# Patient Record
Sex: Male | Born: 1940
Health system: Southern US, Community
[De-identification: ages and names within clinical notes are randomized; demographics above are authoritative.]

## PROBLEM LIST (undated history)

## (undated) DIAGNOSIS — E785 Hyperlipidemia, unspecified: Secondary | ICD-10-CM

## (undated) DIAGNOSIS — M19079 Primary osteoarthritis, unspecified ankle and foot: Secondary | ICD-10-CM

## (undated) DIAGNOSIS — R972 Elevated prostate specific antigen [PSA]: Secondary | ICD-10-CM

## (undated) DIAGNOSIS — M1990 Unspecified osteoarthritis, unspecified site: Secondary | ICD-10-CM

## (undated) DIAGNOSIS — Z87442 Personal history of urinary calculi: Secondary | ICD-10-CM

## (undated) DIAGNOSIS — C801 Malignant (primary) neoplasm, unspecified: Secondary | ICD-10-CM

## (undated) DIAGNOSIS — K4090 Unilateral inguinal hernia, without obstruction or gangrene, not specified as recurrent: Secondary | ICD-10-CM

## (undated) DIAGNOSIS — Z9989 Dependence on other enabling machines and devices: Secondary | ICD-10-CM

## (undated) HISTORY — DX: Hyperlipidemia, unspecified: E78.5

## (undated) HISTORY — PX: KNEE ARTHROSCOPY: SUR90

## (undated) HISTORY — PX: ANKLE FUSION: SHX881

## (undated) HISTORY — PX: FOOT ARTHRODESIS, SUBTALAR: SUR53

## (undated) HISTORY — DX: Personal history of urinary calculi: Z87.442

## (undated) HISTORY — PX: JOINT REPLACEMENT: SHX530

## (undated) HISTORY — PX: HYDROCELE EXCISION / REPAIR: SUR1145

## (undated) HISTORY — PX: BONE MARROW ASPIRATION: SHX1252

## (undated) HISTORY — DX: Hereditary hemochromatosis: E83.110

## (undated) HISTORY — DX: Primary osteoarthritis, unspecified ankle and foot: M19.079

---

## 2004-09-11 HISTORY — PX: REVISION TOTAL HIP ARTHROPLASTY: SHX766

## 2008-04-30 DIAGNOSIS — C4491 Basal cell carcinoma of skin, unspecified: Secondary | ICD-10-CM

## 2008-04-30 HISTORY — DX: Basal cell carcinoma of skin, unspecified: C44.91

## 2010-04-18 ENCOUNTER — Ambulatory Visit: Payer: Self-pay | Admitting: Orthopedic Surgery

## 2010-05-17 DIAGNOSIS — C4492 Squamous cell carcinoma of skin, unspecified: Secondary | ICD-10-CM

## 2010-05-17 HISTORY — DX: Squamous cell carcinoma of skin, unspecified: C44.92

## 2012-10-03 DIAGNOSIS — M19079 Primary osteoarthritis, unspecified ankle and foot: Secondary | ICD-10-CM

## 2012-10-03 HISTORY — DX: Primary osteoarthritis, unspecified ankle and foot: M19.079

## 2014-02-24 DIAGNOSIS — M19079 Primary osteoarthritis, unspecified ankle and foot: Secondary | ICD-10-CM

## 2014-02-24 HISTORY — DX: Primary osteoarthritis, unspecified ankle and foot: M19.079

## 2015-11-03 DIAGNOSIS — M19071 Primary osteoarthritis, right ankle and foot: Secondary | ICD-10-CM | POA: Diagnosis not present

## 2015-11-03 DIAGNOSIS — E785 Hyperlipidemia, unspecified: Secondary | ICD-10-CM | POA: Insufficient documentation

## 2015-11-03 DIAGNOSIS — E784 Other hyperlipidemia: Secondary | ICD-10-CM | POA: Diagnosis not present

## 2015-11-03 DIAGNOSIS — Z125 Encounter for screening for malignant neoplasm of prostate: Secondary | ICD-10-CM | POA: Diagnosis not present

## 2015-11-03 DIAGNOSIS — Z1211 Encounter for screening for malignant neoplasm of colon: Secondary | ICD-10-CM | POA: Diagnosis not present

## 2015-11-03 DIAGNOSIS — Z85828 Personal history of other malignant neoplasm of skin: Secondary | ICD-10-CM | POA: Diagnosis not present

## 2015-11-03 HISTORY — DX: Hyperlipidemia, unspecified: E78.5

## 2015-11-03 HISTORY — DX: Hereditary hemochromatosis: E83.110

## 2015-12-01 ENCOUNTER — Ambulatory Visit (INDEPENDENT_AMBULATORY_CARE_PROVIDER_SITE_OTHER): Payer: Commercial Managed Care - HMO | Admitting: Urology

## 2015-12-01 ENCOUNTER — Encounter: Payer: Self-pay | Admitting: Urology

## 2015-12-01 VITALS — BP 139/82 | HR 91 | Ht 67.0 in | Wt 168.0 lb

## 2015-12-01 DIAGNOSIS — R972 Elevated prostate specific antigen [PSA]: Secondary | ICD-10-CM

## 2015-12-01 LAB — URINALYSIS, COMPLETE
BILIRUBIN UA: NEGATIVE
Glucose, UA: NEGATIVE
Leukocytes, UA: NEGATIVE
NITRITE UA: NEGATIVE
Protein, UA: NEGATIVE
SPEC GRAV UA: 1.025 (ref 1.005–1.030)
UUROB: 0.2 mg/dL (ref 0.2–1.0)
pH, UA: 5.5 (ref 5.0–7.5)

## 2015-12-01 LAB — MICROSCOPIC EXAMINATION

## 2015-12-01 NOTE — Progress Notes (Signed)
12/01/2015 5:27 PM   Andrew Walsh [birth year] [id number]  Referring provider: Adin Hector, MD [facility] [place], [place] [address]  Chief Complaint  Patient presents with  . Elevated PSA    New Patient    HPI: 75 year old male referred by Dr. Caryl Walsh for evaluation of elevated PSA.  His most recent PSA was 5.09 on 11/03/2015. Repeat PSA was drawn today.  He has no prior PSA values for comparison.  He previously saw Dr. Jacelyn Grip in [place] as a PCP and has recently transferred his care to Dr. Caryl Walsh.    He did see a Dealer (Dr. Lucia Walsh ) 5 years ago for a similar reason.  He does not recall what happened during that visit and never had a prostate biopsy.    No issues voiding.  No nocturia, good stream, no dysuria, good bladder emptying.  No UTIs or gross hematuria.    Possible family history of prostate cancer in brother but is unsure of details (astranged from him).  No weight loss or bone pain.  PMHx significant for hemochromatosis.        PMH: Past Medical History  Diagnosis Date  . Ankle arthropathy 02/24/2014  . Arthritis of foot, degenerative 10/03/2012    Overview:  Subtalar arthritis   . Localized, primary osteoarthritis of ankle or foot 02/24/2014  . Hereditary hemochromatosis ([place]) 11/03/2015    Overview:  Treated by regular blood donation   . HLD (hyperlipidemia) 11/03/2015  . History of kidney stones     Surgical History: Past Surgical History  Procedure Laterality Date  . Revision total hip arthroplasty  2006    Right 2011, Left 2006  . Ankle fusion Right 2001, 2016    Home Medications:    Medication List       This list is accurate as of: 12/01/15  5:27 PM.  Always use your most recent med list.               Glucosamine 500 MG Caps  Take by mouth.     MULTI-VITAMINS Tabs  Take by mouth.     simvastatin 20 MG tablet  Commonly known as:  ZOCOR        Allergies: No Known Allergies  Family History: Family  History  Problem Relation Age of Onset  . Bladder Cancer Neg Hx   . Prostate cancer Neg Hx   . Kidney cancer Neg Hx     Social History:  reports that he has quit smoking. He does not have any smokeless tobacco history on file. He reports that he drinks alcohol. He reports that he does not use illicit drugs.  ROS: UROLOGY Frequent Urination?: No Hard to postpone urination?: No Burning/pain with urination?: No Get up at night to urinate?: No Leakage of urine?: No Urine stream starts and stops?: No Trouble starting stream?: No Do you have to strain to urinate?: No Blood in urine?: No Urinary tract infection?: No Sexually transmitted disease?: No Injury to kidneys or bladder?: No Painful intercourse?: No Weak stream?: No Erection problems?: No Penile pain?: No  Gastrointestinal Nausea?: No Vomiting?: No Indigestion/heartburn?: No Diarrhea?: No Constipation?: No  Constitutional Fever: No Night sweats?: No Weight loss?: No Fatigue?: No  Skin Skin rash/lesions?: No Itching?: No  Eyes Blurred vision?: No Double vision?: No  Ears/Nose/Throat Sore throat?: No Sinus problems?: No  Hematologic/Lymphatic Swollen glands?: No Easy bruising?: No  Cardiovascular Leg swelling?: No Chest pain?: No  Respiratory Cough?: No Shortness of  breath?: No  Endocrine Excessive thirst?: No  Musculoskeletal Back pain?: No Joint pain?: No  Neurological Headaches?: No Dizziness?: No  Psychologic Depression?: No Anxiety?: No  Physical Exam: BP 139/82 mmHg  Pulse 91  Ht 5' 7"  (1.702 m)  Wt 168 lb (76.204 kg)  BMI 26.31 kg/m2  Constitutional:  Alert and oriented, No acute distress. HEENT: [place] AT, moist mucus membranes.  Trachea midline, no masses. Cardiovascular: No clubbing, cyanosis, or edema. Respiratory: Normal respiratory effort, no increased work of breathing. GI: Abdomen is soft, nontender, nondistended, no abdominal masses GU: No CVA tenderness.  Rectal:  Normal sphincter tone.  Prostate 30 cc, nontender, no nodules. Skin: No rashes, bruises or suspicious lesions. Lymph: No cervical adenopathy. Neurologic: Grossly intact, no focal deficits, moving all 4 extremities. Psychiatric: Normal mood and affect.  Laboratory Data: Comprehensive Metabolic Panel (CMP) (50/05/3817 11:23 AM) Comprehensive Metabolic Panel (CMP) (29/93/7169 11:23 AM)  Component Value Ref Range  Glucose 95 70 - 110 mg/dL  Sodium 136 136 - 145 mmol/L  Potassium 4.2 3.6 - 5.1 mmol/L  Chloride 102 97 - 109 mmol/L  Carbon Dioxide (CO2) 24.7 22.0 - 32.0 mmol/L  Urea Nitrogen (BUN) 18 7 - 25 mg/dL  Creatinine 0.9 0.7 - 1.3 mg/dL  Glomerular Filtration Rate (eGFR), MDRD Estimate 82 >60 mL/min/1.73sq m    CBC w/auto Differential (5 Part) (11/03/2015 11:23 AM) CBC w/auto Differential (5 Part) (11/03/2015 11:23 AM)  Component Value Ref Range  WBC (White Blood Cell Count) 5.5 4.1 - 10.2 10^3/uL  RBC (Red Blood Cell Count) 3.52 (L) 4.69 - 6.13 10^6/uL  Hemoglobin 12.5 (L) 14.1 - 18.1 gm/dL  Hematocrit 36.4 (L) 40.0 - 52.0 %  MCV (Mean Corpuscular Volume) 103.4 (H) 80.0 - 100.0 fl  MCH (Mean Corpuscular Hemoglobin) 35.5 (H) 27.0 - 31.2 pg  MCHC (Mean Corpuscular Hemoglobin Concentration) 34.3 32.0 - 36.0 gm/dL  Platelet Count 209 150 - 450 10^3/uL     Urinalysis UA reviewed today, negative. Dip positive for 2+ blood but no red blood cells seen on microscopic exam. Trace ketones.  Pertinent Imaging: n/a  Assessment & Plan:    1. Elevated PSA  We reviewed the implications of an elevated PSA and the uncertainty surrounding it. In general, a man's PSA increases with age and is produced by both normal and cancerous prostate tissue. Differential for elevated PSA is BPH, prostate cancer, infection, recent intercourse/ejaculation, prostate infarction, recent urethroscopic manipulation (foley placement/cystoscopy) and prostatitis. Management of an elevated PSA can include  observation or prostate biopsy and wediscussed this in detail. We discussed that indications for prostate biopsy are defined by age and race specific PSA cutoffs as well as a PSA velocity of 0.75/year.  PSA repeated today. We'll call patient with follow-up PSA results.  Patient has signed release from his previous PCP to obtain his laboratory data. He will also check at home to see if he has any of this and will call our office or stop by with those records. I expect that this will be helpful to establish PSA trend.  If PSA) stable today, we'll recommend following his PSA was repeated in 6 months to help establish a trend prior to proceeding with any biopsy. Patient is agreeable with this plan.  - PSA - Urinalysis, Complete   Return in about 6 months (around 06/02/2016) for PSA/ DRE (lab work prior to visit).  Hollice Espy, MD  [facility] [address] [place], [place] [address] [phone]

## 2015-12-02 LAB — PSA: Prostate Specific Ag, Serum: 4.9 ng/mL — ABNORMAL HIGH (ref 0.0–4.0)

## 2015-12-03 ENCOUNTER — Telehealth: Payer: Self-pay

## 2015-12-03 NOTE — Telephone Encounter (Signed)
-----   Message from Hollice Espy, MD sent at 12/02/2015  2:43 PM EDT ----- Please call this patient with his PSA results, 4.9. Please let him know that this indeed is elevated by it it would be helpful to have additional data points i.e. previous PSAs. Please remind him that he was going to try to look through his records and bring some of those values. At this point, a do feel that it is reasonable to wait 6 months and repeat his PSA and rectal exam prior to proceeding with biopsy.  Hollice Espy, MD

## 2015-12-03 NOTE — Telephone Encounter (Signed)
Patient notified, he will keep looking through his records if he does not find he will come to the office to sign a release form.

## 2016-01-31 DIAGNOSIS — D485 Neoplasm of uncertain behavior of skin: Secondary | ICD-10-CM | POA: Diagnosis not present

## 2016-01-31 DIAGNOSIS — L578 Other skin changes due to chronic exposure to nonionizing radiation: Secondary | ICD-10-CM | POA: Diagnosis not present

## 2016-01-31 DIAGNOSIS — D692 Other nonthrombocytopenic purpura: Secondary | ICD-10-CM | POA: Diagnosis not present

## 2016-01-31 DIAGNOSIS — Z85828 Personal history of other malignant neoplasm of skin: Secondary | ICD-10-CM | POA: Diagnosis not present

## 2016-01-31 DIAGNOSIS — Z1283 Encounter for screening for malignant neoplasm of skin: Secondary | ICD-10-CM | POA: Diagnosis not present

## 2016-01-31 DIAGNOSIS — L281 Prurigo nodularis: Secondary | ICD-10-CM | POA: Diagnosis not present

## 2016-01-31 DIAGNOSIS — L821 Other seborrheic keratosis: Secondary | ICD-10-CM | POA: Diagnosis not present

## 2016-01-31 DIAGNOSIS — L570 Actinic keratosis: Secondary | ICD-10-CM | POA: Diagnosis not present

## 2016-01-31 HISTORY — DX: Actinic keratosis: L57.0

## 2016-04-26 DIAGNOSIS — E784 Other hyperlipidemia: Secondary | ICD-10-CM | POA: Diagnosis not present

## 2016-05-03 DIAGNOSIS — M19271 Secondary osteoarthritis, right ankle and foot: Secondary | ICD-10-CM | POA: Diagnosis not present

## 2016-05-03 DIAGNOSIS — M19071 Primary osteoarthritis, right ankle and foot: Secondary | ICD-10-CM | POA: Diagnosis not present

## 2016-05-03 DIAGNOSIS — E784 Other hyperlipidemia: Secondary | ICD-10-CM | POA: Diagnosis not present

## 2016-05-03 DIAGNOSIS — D6489 Other specified anemias: Secondary | ICD-10-CM | POA: Diagnosis not present

## 2016-05-11 DIAGNOSIS — D6489 Other specified anemias: Secondary | ICD-10-CM | POA: Diagnosis not present

## 2016-06-01 DIAGNOSIS — L578 Other skin changes due to chronic exposure to nonionizing radiation: Secondary | ICD-10-CM | POA: Diagnosis not present

## 2016-06-01 DIAGNOSIS — Z85828 Personal history of other malignant neoplasm of skin: Secondary | ICD-10-CM | POA: Diagnosis not present

## 2016-06-01 DIAGNOSIS — L820 Inflamed seborrheic keratosis: Secondary | ICD-10-CM | POA: Diagnosis not present

## 2016-06-01 DIAGNOSIS — L570 Actinic keratosis: Secondary | ICD-10-CM | POA: Diagnosis not present

## 2016-06-02 ENCOUNTER — Other Ambulatory Visit: Payer: Commercial Managed Care - HMO

## 2016-06-02 DIAGNOSIS — R972 Elevated prostate specific antigen [PSA]: Secondary | ICD-10-CM

## 2016-06-03 LAB — PSA: Prostate Specific Ag, Serum: 5.5 ng/mL — ABNORMAL HIGH (ref 0.0–4.0)

## 2016-06-09 ENCOUNTER — Ambulatory Visit (INDEPENDENT_AMBULATORY_CARE_PROVIDER_SITE_OTHER): Payer: Commercial Managed Care - HMO | Admitting: Urology

## 2016-06-09 ENCOUNTER — Encounter: Payer: Self-pay | Admitting: Urology

## 2016-06-09 VITALS — BP 158/92 | HR 79 | Ht 68.0 in | Wt 170.0 lb

## 2016-06-09 DIAGNOSIS — R972 Elevated prostate specific antigen [PSA]: Secondary | ICD-10-CM | POA: Diagnosis not present

## 2016-06-09 NOTE — Progress Notes (Signed)
06/09/2016 1:53 PM   Lynder Parents 1941/04/16 [id number]  Referring provider: Adin Hector, MD [facility] [place], [place] [address]  Chief Complaint  Patient presents with  . Elevated PSA    6 month w/PSA    HPI: 75 year old male referred by Dr. [name] for evaluation of elevated PSA.  His most recent PSA was 5.09 on 11/03/2015. Repeat PSA was  4.9.  PSA today 5.5.  He previously saw Dr. Jacelyn Grip in [place] as a PCP and has recently transferred his care to Dr. [name].    He did see a Dealer (Dr. Lucia Bitter ) 5 years ago for a similar reason.  He does not recall what happened during that visit and never had a prostate biopsy.    No issues voiding.  No nocturia, good stream, no dysuria, good bladder emptying.  No UTIs or gross hematuria.    Possible family history of prostate cancer in brother but is unsure of details (astranged from him).  No weight loss or bone pain.  PMHx significant for hemochromatosis.       PMH: Past Medical History:  Diagnosis Date  . Ankle arthropathy 02/24/2014  . Arthritis of foot, degenerative 10/03/2012   Overview:  Subtalar arthritis   . Hereditary hemochromatosis ([place]) 11/03/2015   Overview:  Treated by regular blood donation   . History of kidney stones   . HLD (hyperlipidemia) 11/03/2015  . Localized, primary osteoarthritis of ankle or foot 02/24/2014    Surgical History: Past Surgical History:  Procedure Laterality Date  . ANKLE FUSION Right 2001, 2016  . REVISION TOTAL HIP ARTHROPLASTY  2006   Right 2011, Left 2006    Home Medications:    Medication List       Accurate as of 06/09/16  1:53 PM. Always use your most recent med list.          CALCIUM-VITAMIN D3 PO Take by mouth.   Glucosamine 500 MG Caps Take by mouth.   MULTI-VITAMINS Tabs Take by mouth.   simvastatin 20 MG tablet Commonly known as:  ZOCOR       Allergies: No Known Allergies  Family History: Family History  Problem  Relation Age of Onset  . Bladder Cancer Neg Hx   . Prostate cancer Neg Hx   . Kidney cancer Neg Hx     Social History:  reports that he has quit smoking. He has never used smokeless tobacco. He reports that he drinks alcohol. He reports that he does not use drugs.  ROS: UROLOGY Frequent Urination?: No Hard to postpone urination?: No Burning/pain with urination?: No Get up at night to urinate?: No Leakage of urine?: No Urine stream starts and stops?: No Trouble starting stream?: No Do you have to strain to urinate?: No Blood in urine?: No Urinary tract infection?: No Sexually transmitted disease?: No Injury to kidneys or bladder?: No Painful intercourse?: No Weak stream?: No Erection problems?: No Penile pain?: No  Gastrointestinal Nausea?: No Vomiting?: No Indigestion/heartburn?: No Diarrhea?: No Constipation?: No  Constitutional Fever: No Night sweats?: No Weight loss?: No Fatigue?: No  Skin Skin rash/lesions?: No Itching?: No  Eyes Blurred vision?: No Double vision?: No  Ears/Nose/Throat Sore throat?: No Sinus problems?: No  Hematologic/Lymphatic Swollen glands?: No Easy bruising?: No  Cardiovascular Leg swelling?: No Chest pain?: No  Respiratory Cough?: No Shortness of breath?: No  Endocrine Excessive thirst?: No  Musculoskeletal Back pain?: No Joint pain?: No  Neurological Headaches?: No Dizziness?: No  Psychologic Depression?: No Anxiety?: No  Physical Exam: BP (!) 158/92   Pulse 79   Ht _0  (1.727 m)   Wt 170 lb (77.1 kg)   BMI 25.85 kg/m   Constitutional:  Alert and oriented, No acute distress. HEENT: [place] AT, moist mucus membranes.  Trachea midline, no masses. Cardiovascular: No clubbing, cyanosis, or edema. Respiratory: Normal respiratory effort, no increased work of breathing. GI: Abdomen is soft, nontender, nondistended, no abdominal masses GU: No CVA tenderness.  Rectal: Deferred today, previously Prostate 30 cc,  nontender, no nodules. Skin: No rashes, bruises or suspicious lesions. Lymph: No cervical adenopathy. Neurologic: Grossly intact, no focal deficits, moving all 4 extremities. Psychiatric: Normal mood and affect.  Laboratory Data: Comprehensive Metabolic Panel (CMP) (84/66/5993 11:23 AM) Comprehensive Metabolic Panel (CMP) (57/09/7791 11:23 AM)  Component Value Ref Range  Glucose 95 70 - 110 mg/dL  Sodium 136 136 - 145 mmol/L  Potassium 4.2 3.6 - 5.1 mmol/L  Chloride 102 97 - 109 mmol/L  Carbon Dioxide (CO2) 24.7 22.0 - 32.0 mmol/L  Urea Nitrogen (BUN) 18 7 - 25 mg/dL  Creatinine 0.9 0.7 - 1.3 mg/dL  Glomerular Filtration Rate (eGFR), MDRD Estimate 82 >60 mL/min/1.73sq m    CBC w/auto Differential (5 Part) (11/03/2015 11:23 AM) CBC w/auto Differential (5 Part) (11/03/2015 11:23 AM)  Component Value Ref Range  WBC (White Blood Cell Count) 5.5 4.1 - 10.2 10^3/uL  RBC (Red Blood Cell Count) 3.52 (L) 4.69 - 6.13 10^6/uL  Hemoglobin 12.5 (L) 14.1 - 18.1 gm/dL  Hematocrit 36.4 (L) 40.0 - 52.0 %  MCV (Mean Corpuscular Volume) 103.4 (H) 80.0 - 100.0 fl  MCH (Mean Corpuscular Hemoglobin) 35.5 (H) 27.0 - 31.2 pg  MCHC (Mean Corpuscular Hemoglobin Concentration) 34.3 32.0 - 36.0 gm/dL  Platelet Count 209 150 - 450 10^3/uL   Component     Latest Ref Rng & Units 12/01/2015 06/02/2016  PSA     0.0 - 4.0 ng/mL 4.9 (H) 5.5 (H)    Urinalysis UA reviewed today, negative. Dip positive for 2+ blood but no red blood cells seen on microscopic exam. Trace ketones.  Pertinent Imaging: n/a  Assessment & Plan:    1. Elevated PSA PSA essentially stable from 6 moths ago- no intervention recommended at this time Recommend final PSA/ DRE in 1 year, if stable, will defer further screeing - PSA - Urinalysis, Complete   Return in about 1 year (around 06/09/2017) for PSA/DRE.  Hollice Espy, MD  [facility] [address], [place], [place] [address] [phone]

## 2016-10-27 DIAGNOSIS — E784 Other hyperlipidemia: Secondary | ICD-10-CM | POA: Diagnosis not present

## 2016-11-03 DIAGNOSIS — R972 Elevated prostate specific antigen [PSA]: Secondary | ICD-10-CM | POA: Diagnosis not present

## 2016-11-03 DIAGNOSIS — Z23 Encounter for immunization: Secondary | ICD-10-CM | POA: Diagnosis not present

## 2016-11-03 DIAGNOSIS — E784 Other hyperlipidemia: Secondary | ICD-10-CM | POA: Diagnosis not present

## 2016-11-03 DIAGNOSIS — Z0000 Encounter for general adult medical examination without abnormal findings: Secondary | ICD-10-CM | POA: Diagnosis not present

## 2016-11-03 DIAGNOSIS — D649 Anemia, unspecified: Secondary | ICD-10-CM | POA: Diagnosis not present

## 2016-11-03 DIAGNOSIS — M19071 Primary osteoarthritis, right ankle and foot: Secondary | ICD-10-CM | POA: Diagnosis not present

## 2016-12-18 DIAGNOSIS — L820 Inflamed seborrheic keratosis: Secondary | ICD-10-CM | POA: Diagnosis not present

## 2016-12-18 DIAGNOSIS — Z1283 Encounter for screening for malignant neoplasm of skin: Secondary | ICD-10-CM | POA: Diagnosis not present

## 2016-12-18 DIAGNOSIS — Z85828 Personal history of other malignant neoplasm of skin: Secondary | ICD-10-CM | POA: Diagnosis not present

## 2016-12-18 DIAGNOSIS — L570 Actinic keratosis: Secondary | ICD-10-CM | POA: Diagnosis not present

## 2016-12-18 DIAGNOSIS — L578 Other skin changes due to chronic exposure to nonionizing radiation: Secondary | ICD-10-CM | POA: Diagnosis not present

## 2016-12-18 DIAGNOSIS — L812 Freckles: Secondary | ICD-10-CM | POA: Diagnosis not present

## 2016-12-18 DIAGNOSIS — L821 Other seborrheic keratosis: Secondary | ICD-10-CM | POA: Diagnosis not present

## 2016-12-18 DIAGNOSIS — C44622 Squamous cell carcinoma of skin of right upper limb, including shoulder: Secondary | ICD-10-CM | POA: Diagnosis not present

## 2016-12-18 DIAGNOSIS — D229 Melanocytic nevi, unspecified: Secondary | ICD-10-CM | POA: Diagnosis not present

## 2017-02-19 DIAGNOSIS — L820 Inflamed seborrheic keratosis: Secondary | ICD-10-CM | POA: Diagnosis not present

## 2017-02-19 DIAGNOSIS — L821 Other seborrheic keratosis: Secondary | ICD-10-CM | POA: Diagnosis not present

## 2017-02-19 DIAGNOSIS — L812 Freckles: Secondary | ICD-10-CM | POA: Diagnosis not present

## 2017-02-19 DIAGNOSIS — L578 Other skin changes due to chronic exposure to nonionizing radiation: Secondary | ICD-10-CM | POA: Diagnosis not present

## 2017-02-19 DIAGNOSIS — Z85828 Personal history of other malignant neoplasm of skin: Secondary | ICD-10-CM | POA: Diagnosis not present

## 2017-02-19 DIAGNOSIS — L570 Actinic keratosis: Secondary | ICD-10-CM | POA: Diagnosis not present

## 2017-04-27 DIAGNOSIS — R972 Elevated prostate specific antigen [PSA]: Secondary | ICD-10-CM | POA: Diagnosis not present

## 2017-04-27 DIAGNOSIS — E784 Other hyperlipidemia: Secondary | ICD-10-CM | POA: Diagnosis not present

## 2017-04-27 DIAGNOSIS — D649 Anemia, unspecified: Secondary | ICD-10-CM | POA: Diagnosis not present

## 2017-05-04 DIAGNOSIS — E784 Other hyperlipidemia: Secondary | ICD-10-CM | POA: Diagnosis not present

## 2017-05-04 DIAGNOSIS — R972 Elevated prostate specific antigen [PSA]: Secondary | ICD-10-CM | POA: Diagnosis not present

## 2017-05-04 DIAGNOSIS — D649 Anemia, unspecified: Secondary | ICD-10-CM | POA: Diagnosis not present

## 2017-06-01 ENCOUNTER — Other Ambulatory Visit: Payer: Self-pay

## 2017-06-01 DIAGNOSIS — R972 Elevated prostate specific antigen [PSA]: Secondary | ICD-10-CM

## 2017-06-02 LAB — PSA: PROSTATE SPECIFIC AG, SERUM: 6.9 ng/mL — AB (ref 0.0–4.0)

## 2017-06-06 ENCOUNTER — Ambulatory Visit: Payer: Commercial Managed Care - HMO | Admitting: Urology

## 2017-06-12 ENCOUNTER — Ambulatory Visit: Payer: Commercial Managed Care - HMO | Admitting: Urology

## 2017-06-26 ENCOUNTER — Ambulatory Visit: Payer: Medicare HMO | Admitting: Urology

## 2017-06-26 ENCOUNTER — Encounter: Payer: Self-pay | Admitting: Urology

## 2017-06-26 VITALS — BP 136/81 | HR 75 | Ht 68.0 in | Wt 167.2 lb

## 2017-06-26 DIAGNOSIS — R972 Elevated prostate specific antigen [PSA]: Secondary | ICD-10-CM | POA: Diagnosis not present

## 2017-06-26 MED ORDER — FINASTERIDE 5 MG PO TABS: 5.0000 mg | ORAL_TABLET | Freq: Every day | ORAL | 3 refills | Status: DC

## 2017-06-26 NOTE — Progress Notes (Signed)
06/26/2017 8:06 AM   Andrew Walsh 04/24/1941 [id number]  Referring provider: Adin Hector, MD [name], [name]  No chief complaint on file.   HPI: 76 year-old male wit history levated PSA who returns today for routine follow up.    His PSA was 5.09 on 11/03/2015 last year upon initial evaluation. This was repeated in the office last year on 05/2016 which was 5.5.  Today, his PSA continues to trend upward, now@6 .9 on 06/01/2017.  He did see a Dealer (Dr. Lucia Bitter ) 5+ years ago for a similar reason.  He does not recall what happened during that visit and never had a prostate biopsy.  He requested these records but never received them.  No issues voiding.  No nocturia, good stream, no dysuria, good bladder emptying.  No UTIs or gross hematuria.    Possible family history of prostate cancer in brother but is unsure of details (astranged from him).  No weight loss or bone pain.  PMHx significant for hemochromatosis.       PMH: Past Medical History:  Diagnosis Date  . Ankle arthropathy 02/24/2014  . Arthritis of foot, degenerative 10/03/2012   Overview:  Subtalar arthritis   . Hereditary hemochromatosis ([place]) 11/03/2015   Overview:  Treated by regular blood donation   . History of kidney stones   . HLD (hyperlipidemia) 11/03/2015  . Localized, primary osteoarthritis of ankle or foot 02/24/2014    Surgical History: Past Surgical History:  Procedure Laterality Date  . ANKLE FUSION Right 2001, 2016  . REVISION TOTAL HIP ARTHROPLASTY  2006   Right 2011, Left 2006    Home Medications:  Allergies as of 06/26/2017   No Known Allergies     Medication List       Accurate as of 06/26/17 11:59 PM. Always use your most recent med list.          CALCIUM-VITAMIN D3 PO Take by mouth.   finasteride 5 MG tablet Commonly known as:  PROSCAR Take 1 tablet (5 mg total) by mouth daily.   Glucosamine 500 MG Caps Take by  mouth.   MULTI-VITAMINS Tabs Take by mouth.   simvastatin 20 MG tablet Commonly known as:  ZOCOR       Allergies: No Known Allergies  Family History: Family History  Problem Relation Age of Onset  . Bladder Cancer Neg Hx   . Prostate cancer Neg Hx   . Kidney cancer Neg Hx     Social History:  reports that he has quit smoking. He has never used smokeless tobacco. He reports that he drinks alcohol. He reports that he does not use drugs.  ROS: UROLOGY Frequent Urination?: No Hard to postpone urination?: No Burning/pain with urination?: No Get up at night to urinate?: No Leakage of urine?: No Urine stream starts and stops?: No Trouble starting stream?: No Do you have to strain to urinate?: No Blood in urine?: No Urinary tract infection?: No Sexually transmitted disease?: No Injury to kidneys or bladder?: No Painful intercourse?: No Weak stream?: No Erection problems?: No Penile pain?: No  Gastrointestinal Nausea?: No Vomiting?: No Indigestion/heartburn?: No Diarrhea?: No Constipation?: No  Constitutional Fever: No Night sweats?: No Weight loss?: No Fatigue?: No  Skin Skin rash/lesions?: No Itching?: No  Eyes Blurred vision?: No Double vision?: No  Ears/Nose/Throat Sore throat?: No Sinus problems?: No  Hematologic/Lymphatic Swollen glands?: No Easy bruising?: No  Cardiovascular Leg swelling?: No Chest pain?: No  Respiratory  Cough?: No Shortness of breath?: No  Endocrine Excessive thirst?: No  Musculoskeletal Back pain?: No Joint pain?: No  Neurological Headaches?: No Dizziness?: No  Psychologic Depression?: No Anxiety?: No  Physical Exam: BP 136/81 (BP Location: Right Arm, Patient Position: Sitting, Cuff Size: Normal)   Pulse 75   Ht 5\' 8"  (1.727 m)   Wt 167 lb 3.2 oz (75.8 kg)   BMI 25.42 kg/m   Constitutional:  Alert and oriented, No acute distress. HEENT: [place] AT, moist mucus membranes.  Trachea midline, no  masses. Cardiovascular: No clubbing, cyanosis, or edema. Respiratory: Normal respiratory effort, no increased work of breathing. GI: Abdomen is soft, nontender, nondistended, no abdominal masses GU: No CVA tenderness.  Rectal: Prostate 30 cc, nontender, no nodules.  These is some subtle induration at the left base along the later ridge without decrete nodules.  Nontender   Skin: No rashes, bruises or suspicious lesions. Neurologic: Grossly intact, no focal deficits, moving all 4 extremities. Psychiatric: Normal mood and affect.  Laboratory Data: Component     Latest Ref Rng & Units 12/01/2015 06/02/2016 06/01/2017  Prostate Specific Ag, Serum     0.0 - 4.0 ng/mL 4.9 (H) 5.5 (H) 6.9 (H)    Urinalysis n/a  Pertinent Imaging: n/a  Assessment & Plan:    1. Elevated PSA Slow steady rise in PSA which is somewhat concerning Rectal exam with induration but no discrete nodules We discussed various options today in detail including proceeding with biopsy, continue to trend PSA, risk stratification labs such as for K score, or finasteride therapy with recheck of PSA in 6 months. Risk and benefits of each option were reviewed in detail. We also discussed at length the natural history of prostate cancer. At his age, benefit of finding low risk prostate cancer would be minimal, however, if there is high were intermediate risk cancer, would possibly consider therapy for localized cancer given his overall health.  At this point in time, it like to start finasteride therapy with recheck of his PSA in 6 months. If his PSA fails to drop by 50%, with highly consider biopsy. All questions answered.  - PSA - Urinalysis, Complete   Return in about 6 months (around 12/25/2017) for PSA (lab prior).  Hollice Espy, MD  [facility] [address] [place], [place] [address] [phone]

## 2017-06-27 ENCOUNTER — Ambulatory Visit: Payer: Medicare HMO

## 2017-10-17 DIAGNOSIS — L719 Rosacea, unspecified: Secondary | ICD-10-CM | POA: Diagnosis not present

## 2017-10-17 DIAGNOSIS — Z85828 Personal history of other malignant neoplasm of skin: Secondary | ICD-10-CM | POA: Diagnosis not present

## 2017-10-17 DIAGNOSIS — L820 Inflamed seborrheic keratosis: Secondary | ICD-10-CM | POA: Diagnosis not present

## 2017-10-17 DIAGNOSIS — L821 Other seborrheic keratosis: Secondary | ICD-10-CM | POA: Diagnosis not present

## 2017-10-17 DIAGNOSIS — L570 Actinic keratosis: Secondary | ICD-10-CM | POA: Diagnosis not present

## 2017-10-17 DIAGNOSIS — Z1283 Encounter for screening for malignant neoplasm of skin: Secondary | ICD-10-CM | POA: Diagnosis not present

## 2017-10-17 DIAGNOSIS — L578 Other skin changes due to chronic exposure to nonionizing radiation: Secondary | ICD-10-CM | POA: Diagnosis not present

## 2017-10-29 DIAGNOSIS — D649 Anemia, unspecified: Secondary | ICD-10-CM | POA: Diagnosis not present

## 2017-10-29 DIAGNOSIS — E7849 Other hyperlipidemia: Secondary | ICD-10-CM | POA: Diagnosis not present

## 2017-11-05 DIAGNOSIS — Z0000 Encounter for general adult medical examination without abnormal findings: Secondary | ICD-10-CM | POA: Diagnosis not present

## 2017-11-05 DIAGNOSIS — R972 Elevated prostate specific antigen [PSA]: Secondary | ICD-10-CM | POA: Diagnosis not present

## 2017-11-05 DIAGNOSIS — M19071 Primary osteoarthritis, right ankle and foot: Secondary | ICD-10-CM | POA: Diagnosis not present

## 2017-11-05 DIAGNOSIS — D649 Anemia, unspecified: Secondary | ICD-10-CM | POA: Diagnosis not present

## 2017-11-05 DIAGNOSIS — E7849 Other hyperlipidemia: Secondary | ICD-10-CM | POA: Diagnosis not present

## 2017-11-05 DIAGNOSIS — R739 Hyperglycemia, unspecified: Secondary | ICD-10-CM | POA: Diagnosis not present

## 2017-12-19 DIAGNOSIS — L578 Other skin changes due to chronic exposure to nonionizing radiation: Secondary | ICD-10-CM | POA: Diagnosis not present

## 2017-12-19 DIAGNOSIS — L821 Other seborrheic keratosis: Secondary | ICD-10-CM | POA: Diagnosis not present

## 2017-12-19 DIAGNOSIS — D485 Neoplasm of uncertain behavior of skin: Secondary | ICD-10-CM | POA: Diagnosis not present

## 2017-12-19 DIAGNOSIS — Z85828 Personal history of other malignant neoplasm of skin: Secondary | ICD-10-CM | POA: Diagnosis not present

## 2017-12-19 DIAGNOSIS — L570 Actinic keratosis: Secondary | ICD-10-CM | POA: Diagnosis not present

## 2017-12-19 DIAGNOSIS — L820 Inflamed seborrheic keratosis: Secondary | ICD-10-CM | POA: Diagnosis not present

## 2017-12-24 ENCOUNTER — Encounter: Payer: Self-pay | Admitting: Urology

## 2017-12-24 ENCOUNTER — Other Ambulatory Visit: Payer: Self-pay

## 2017-12-24 ENCOUNTER — Other Ambulatory Visit: Payer: Medicare HMO

## 2017-12-24 DIAGNOSIS — R972 Elevated prostate specific antigen [PSA]: Secondary | ICD-10-CM

## 2017-12-25 ENCOUNTER — Ambulatory Visit: Payer: Medicare HMO | Admitting: Urology

## 2018-04-17 DIAGNOSIS — L570 Actinic keratosis: Secondary | ICD-10-CM | POA: Diagnosis not present

## 2018-04-17 DIAGNOSIS — Z85828 Personal history of other malignant neoplasm of skin: Secondary | ICD-10-CM | POA: Diagnosis not present

## 2018-04-17 DIAGNOSIS — L578 Other skin changes due to chronic exposure to nonionizing radiation: Secondary | ICD-10-CM | POA: Diagnosis not present

## 2018-04-17 DIAGNOSIS — L820 Inflamed seborrheic keratosis: Secondary | ICD-10-CM | POA: Diagnosis not present

## 2018-05-01 DIAGNOSIS — R972 Elevated prostate specific antigen [PSA]: Secondary | ICD-10-CM | POA: Diagnosis not present

## 2018-05-01 DIAGNOSIS — R739 Hyperglycemia, unspecified: Secondary | ICD-10-CM | POA: Diagnosis not present

## 2018-05-01 DIAGNOSIS — E7849 Other hyperlipidemia: Secondary | ICD-10-CM | POA: Diagnosis not present

## 2018-05-01 DIAGNOSIS — D649 Anemia, unspecified: Secondary | ICD-10-CM | POA: Diagnosis not present

## 2018-05-08 DIAGNOSIS — Z125 Encounter for screening for malignant neoplasm of prostate: Secondary | ICD-10-CM | POA: Diagnosis not present

## 2018-05-08 DIAGNOSIS — M25571 Pain in right ankle and joints of right foot: Secondary | ICD-10-CM | POA: Diagnosis not present

## 2018-05-08 DIAGNOSIS — D649 Anemia, unspecified: Secondary | ICD-10-CM | POA: Diagnosis not present

## 2018-05-08 DIAGNOSIS — Z1211 Encounter for screening for malignant neoplasm of colon: Secondary | ICD-10-CM | POA: Diagnosis not present

## 2018-05-08 DIAGNOSIS — G8929 Other chronic pain: Secondary | ICD-10-CM | POA: Diagnosis not present

## 2018-05-08 DIAGNOSIS — R972 Elevated prostate specific antigen [PSA]: Secondary | ICD-10-CM | POA: Diagnosis not present

## 2018-05-08 DIAGNOSIS — E7849 Other hyperlipidemia: Secondary | ICD-10-CM | POA: Diagnosis not present

## 2018-05-23 DIAGNOSIS — Z1211 Encounter for screening for malignant neoplasm of colon: Secondary | ICD-10-CM | POA: Diagnosis not present

## 2018-05-23 DIAGNOSIS — M21541 Acquired clubfoot, right foot: Secondary | ICD-10-CM | POA: Diagnosis not present

## 2018-10-17 DIAGNOSIS — M25551 Pain in right hip: Secondary | ICD-10-CM | POA: Diagnosis not present

## 2018-10-17 DIAGNOSIS — Z96641 Presence of right artificial hip joint: Secondary | ICD-10-CM | POA: Diagnosis not present

## 2018-10-17 DIAGNOSIS — D649 Anemia, unspecified: Secondary | ICD-10-CM | POA: Diagnosis not present

## 2018-10-17 DIAGNOSIS — E7849 Other hyperlipidemia: Secondary | ICD-10-CM | POA: Diagnosis not present

## 2018-10-17 DIAGNOSIS — R972 Elevated prostate specific antigen [PSA]: Secondary | ICD-10-CM | POA: Diagnosis not present

## 2018-10-17 DIAGNOSIS — Z471 Aftercare following joint replacement surgery: Secondary | ICD-10-CM | POA: Diagnosis not present

## 2018-10-23 DIAGNOSIS — M25551 Pain in right hip: Secondary | ICD-10-CM | POA: Diagnosis not present

## 2018-10-23 DIAGNOSIS — Z96641 Presence of right artificial hip joint: Secondary | ICD-10-CM | POA: Diagnosis not present

## 2018-10-24 DIAGNOSIS — L578 Other skin changes due to chronic exposure to nonionizing radiation: Secondary | ICD-10-CM | POA: Diagnosis not present

## 2018-10-24 DIAGNOSIS — D692 Other nonthrombocytopenic purpura: Secondary | ICD-10-CM | POA: Diagnosis not present

## 2018-10-24 DIAGNOSIS — L739 Follicular disorder, unspecified: Secondary | ICD-10-CM | POA: Diagnosis not present

## 2018-10-24 DIAGNOSIS — Z1283 Encounter for screening for malignant neoplasm of skin: Secondary | ICD-10-CM | POA: Diagnosis not present

## 2018-10-24 DIAGNOSIS — I788 Other diseases of capillaries: Secondary | ICD-10-CM | POA: Diagnosis not present

## 2018-10-24 DIAGNOSIS — D1800 Hemangioma unspecified site: Secondary | ICD-10-CM | POA: Diagnosis not present

## 2018-10-24 DIAGNOSIS — L570 Actinic keratosis: Secondary | ICD-10-CM | POA: Diagnosis not present

## 2018-10-24 DIAGNOSIS — L820 Inflamed seborrheic keratosis: Secondary | ICD-10-CM | POA: Diagnosis not present

## 2018-10-24 DIAGNOSIS — Z85828 Personal history of other malignant neoplasm of skin: Secondary | ICD-10-CM | POA: Diagnosis not present

## 2018-10-30 DIAGNOSIS — Z96642 Presence of left artificial hip joint: Secondary | ICD-10-CM | POA: Diagnosis not present

## 2018-10-30 DIAGNOSIS — Z96641 Presence of right artificial hip joint: Secondary | ICD-10-CM | POA: Diagnosis not present

## 2018-10-31 DIAGNOSIS — Z96642 Presence of left artificial hip joint: Secondary | ICD-10-CM | POA: Diagnosis not present

## 2018-10-31 DIAGNOSIS — Z77018 Contact with and (suspected) exposure to other hazardous metals: Secondary | ICD-10-CM | POA: Diagnosis not present

## 2018-10-31 DIAGNOSIS — Z96641 Presence of right artificial hip joint: Secondary | ICD-10-CM | POA: Diagnosis not present

## 2018-11-08 DIAGNOSIS — E7849 Other hyperlipidemia: Secondary | ICD-10-CM | POA: Diagnosis not present

## 2018-11-08 DIAGNOSIS — D649 Anemia, unspecified: Secondary | ICD-10-CM | POA: Diagnosis not present

## 2018-11-08 DIAGNOSIS — Z125 Encounter for screening for malignant neoplasm of prostate: Secondary | ICD-10-CM | POA: Diagnosis not present

## 2018-11-20 DIAGNOSIS — R972 Elevated prostate specific antigen [PSA]: Secondary | ICD-10-CM | POA: Diagnosis not present

## 2018-11-20 DIAGNOSIS — E7849 Other hyperlipidemia: Secondary | ICD-10-CM | POA: Diagnosis not present

## 2018-11-20 DIAGNOSIS — Z0000 Encounter for general adult medical examination without abnormal findings: Secondary | ICD-10-CM | POA: Diagnosis not present

## 2018-11-20 DIAGNOSIS — M19071 Primary osteoarthritis, right ankle and foot: Secondary | ICD-10-CM | POA: Diagnosis not present

## 2018-11-20 DIAGNOSIS — D649 Anemia, unspecified: Secondary | ICD-10-CM | POA: Diagnosis not present

## 2018-12-26 ENCOUNTER — Ambulatory Visit: Payer: Medicare HMO | Admitting: Urology

## 2018-12-26 ENCOUNTER — Other Ambulatory Visit: Payer: Self-pay

## 2018-12-26 ENCOUNTER — Other Ambulatory Visit: Payer: Medicare HMO

## 2018-12-26 DIAGNOSIS — R972 Elevated prostate specific antigen [PSA]: Secondary | ICD-10-CM | POA: Diagnosis not present

## 2018-12-27 LAB — PSA: Prostate Specific Ag, Serum: 9.4 ng/mL — ABNORMAL HIGH (ref 0.0–4.0)

## 2018-12-30 ENCOUNTER — Telehealth (INDEPENDENT_AMBULATORY_CARE_PROVIDER_SITE_OTHER): Payer: Medicare HMO | Admitting: Urology

## 2018-12-30 ENCOUNTER — Other Ambulatory Visit: Payer: Self-pay

## 2018-12-30 DIAGNOSIS — R972 Elevated prostate specific antigen [PSA]: Secondary | ICD-10-CM | POA: Diagnosis not present

## 2018-12-30 NOTE — Progress Notes (Signed)
Virtual Visit via Telephone Note  I connected with Andrew Walsh on 12/30/18 at 10:00 AM EDT by telephone and verified that I am speaking with the correct person using two identifiers.   I discussed the limitations, risks, security and privacy concerns of performing an evaluation and management service by telephone and the availability of in person appointments. I also discussed with the patient that there may be a patient responsible charge related to this service. The patient expressed understanding and agreed to proceed.   History of Present Illness: 78 year old male previously followed for elevated PSA he returns today with continued rise in his PSA.  In light of COVID-19, today's visit was performed via telephone.  Andrew Walsh has a known history of elevated PSA.  He was first seen and evaluated by me in 2017 and was followed until 06/2017 with elevated/rising PSA.  At that time, his PSA had risen as high as 6.9 on 05/2017.  He had not wanted to pursue biopsy thus we discussed initiation of finasteride primarily for diagnostic evaluation with repeat PSA in 6 months but he failed to return.  He reports that he had other things going on including the death of a family member and thus his elevated/rising PSA was not a priority.  He returns today after notable continued rise up to 9.33 on 11/08/2018 by his primary care physician.  This was repeated by me prior to his visit today which time his PSA was noted to be 4.9 as of 12/26/2018.  This is study upward trend.  Rectal exam at last visit did show some induration which was subtle along the left base/lateral ridge without obvious discrete nodules.    He did take the finasteride for 6 months but never followed up thereafter.  He is not been on finasteride for at least a year at this point.  He denies any urinary symptoms.  No weight loss or bone pain.  No voiding issues.  He gets up once at night to urinate.  His stream is excellent.  He feels like  he empties his bladder completely.  Component     Latest Ref Rng & Units 12/01/2015 06/02/2016 06/01/2017 12/26/2018  Prostate Specific Ag, Serum     0.0 - 4.0 ng/mL 4.9 (H) 5.5 (H) 6.9 (H) 9.4 (H)     Observations/Objective: Calm, understands our discussion well, asking appropriate questiosn  Assessment and Plan:  1. Elevated PSA Personal history of elevated and rising PSA  We discussed that based on the overall trend and steady rise including fairly aggressive recent rise, I strongly recommend either pursuing prostate MRI with likely fusion biopsy thereafter versus going ahead and pursuing through 12 core biopsy.  We discussed the risk and benefits of each of these treatment/diagnostic modalities.  At this point in time, observation is no longer recommended as there is a high suspicion for underlying malignancy.  Despite his age, he is relatively healthy and has good functional status.  We discussed prostate biopsy in detail including the procedure itself, the risks of blood in the urine, stool, and ejaculate, serious infection, and discomfort.  Considering his options, he would prefer to pursue prostate MRI first followed by fusion biopsy as deemed necessary.  He understands the importance and timeliness of this as well as importance of compliance with recommended regimen.  - MR PROSTATE W WO CONTRAST; Future  2. Rising PSA level As above - MR PROSTATE W WO CONTRAST; Future   Follow Up Instructions: F/u 6 weeks after prostate  MRI (virutal visit OK) to discuss results   I discussed the assessment and treatment plan with the patient. The patient was provided an opportunity to ask questions and all were answered. The patient agreed with the plan and demonstrated an understanding of the instructions.   The patient was advised to call back or seek an in-person evaluation if the symptoms worsen or if the condition fails to improve as anticipated.  I provided 22 minutes of  non-face-to-face time during this encounter.   Hollice Espy, MD

## 2018-12-31 ENCOUNTER — Telehealth: Payer: Self-pay | Admitting: Urology

## 2018-12-31 NOTE — Telephone Encounter (Signed)
MRI is pending PA I will make his follow up app once this has been approved   Michelle

## 2019-01-22 DIAGNOSIS — C44622 Squamous cell carcinoma of skin of right upper limb, including shoulder: Secondary | ICD-10-CM | POA: Diagnosis not present

## 2019-01-22 DIAGNOSIS — D485 Neoplasm of uncertain behavior of skin: Secondary | ICD-10-CM | POA: Diagnosis not present

## 2019-02-06 ENCOUNTER — Other Ambulatory Visit: Payer: Self-pay

## 2019-02-06 ENCOUNTER — Ambulatory Visit
Admission: RE | Admit: 2019-02-06 | Discharge: 2019-02-06 | Disposition: A | Discharge status: Home / Self Care | Payer: Medicare HMO | Source: Ambulatory Visit | Attending: Urology | Admitting: Urology

## 2019-02-06 DIAGNOSIS — R972 Elevated prostate specific antigen [PSA]: Secondary | ICD-10-CM

## 2019-02-06 LAB — POCT I-STAT CREATININE: Creatinine, Ser: 0.9 mg/dL (ref 0.61–1.24)

## 2019-02-06 MED ORDER — GADOBUTROL 1 MMOL/ML IV SOLN
7.0000 mL | Freq: Once | INTRAVENOUS | Status: AC | PRN
  Administered 2019-02-06: 15:00:00 7 mL via INTRAVENOUS

## 2019-02-10 ENCOUNTER — Telehealth: Payer: Self-pay | Admitting: Family Medicine

## 2019-02-10 NOTE — Telephone Encounter (Signed)
-----   Message from Hollice Espy, MD sent at 02/10/2019  3:28 PM EDT ----- Please schedule virtual visit to review this study as discussed at his last visit.    Hollice Espy, MD

## 2019-02-10 NOTE — Telephone Encounter (Signed)
Patient notified, appointment made

## 2019-02-19 ENCOUNTER — Telehealth: Payer: Medicare HMO | Admitting: Urology

## 2019-02-20 ENCOUNTER — Telehealth (INDEPENDENT_AMBULATORY_CARE_PROVIDER_SITE_OTHER): Payer: Medicare HMO | Admitting: Urology

## 2019-02-20 ENCOUNTER — Other Ambulatory Visit: Payer: Self-pay

## 2019-02-20 DIAGNOSIS — R972 Elevated prostate specific antigen [PSA]: Secondary | ICD-10-CM

## 2019-02-20 NOTE — Progress Notes (Signed)
Virtual Visit via Telephone Note  I connected with Andrew Walsh on 02/20/19 at 12:00 PM EDT by telephone and verified that I am speaking with the correct person using two identifiers.  Location: Patient: home Provider: office   I discussed the limitations, risks, security and privacy concerns of performing an evaluation and management service by telephone and the availability of in person appointments. I also discussed with the patient that there may be a patient responsible charge related to this service. The patient expressed understanding and agreed to proceed.   History of Present Illness: 4 with a personal history of elevated and rising PSA returns today for follow-up MRI results.  [name] has a known history of elevated PSA.  He was first seen and evaluated by me in 2017 and was followed until 06/2017 with elevated/rising PSA.  At that time, his PSA had risen as high as 6.9 on 05/2017.  He had not wanted to pursue biopsy thus we discussed initiation of finasteride primarily for diagnostic evaluation with repeat PSA in 6 months but he failed to return.  He reports that he had other things going on including the death of a family member and thus his elevated/rising PSA was not a priority.  In the interim, his PSA was noted to continued rise up to 9.33 on 11/08/2018 by his primary care physician.  This was repeated by me prior to his visit today which time his PSA was noted to be 4.9 as of 12/26/2018.    After lengthy discussion, he is most interested in pursuing MRI prior to prostate biopsy.  Prostate MRI was completed on 02/06/2019 which was well-tolerated.  Unfortunately, due to the presence of his bilateral hip replacements, the imaging quality was relatively poor although there were some suspicious lesions, unable to provide PI-RADS score due to quality of image.  He is asymptomatic.  No weight loss or bone pain.  No urinary symptoms.  He is fairly healthy for his age and takes  only a statin.   IMPRESSION: 1. The patient's bilateral hip implants cause the post-contrast images, diffusion-weighted images, and ADC map images to be nondiagnostic. Other images have reduced utility. There is distortion of the prostate gland by the metal artifact. 2. Three regions of interest are identified in the prostate gland, 2 representing low T2 signal intensity focal lesions in the peripheral zone, and 1 involving the anterior transition zone and anterior stroma. PI-RADS categories cannot be assigned given the lack of diagnostic postcontrast images and lack of diagnostic diffusion-weighted images, which is typically the case when patients with bilateral hip implants are imaged. However, regions of interest were drawn and targeting data sent to [facility] encase sampling of these potential lesions is warranted. 3. Sigmoid colon diverticulosis.   Electronically Signed   By: Van Clines M.D.   On: 02/06/2019 16:21  Observations/Objective: Engaged, asking intelligent questions  Assessment and Plan:  1. Elevated PSA In the setting of rising/elevated PSA with some suspicious lesions on MR (albeit quality fairly poor) I have recommended that he proceed for cognitive versus MRI fusion biopsy.  Difference between the 2 was discussed in detail.  We discussed prostate biopsy in detail including the procedure itself, the risks of blood in the urine, stool, and ejaculate, serious infection, and discomfort. He is willing to proceed with this as discussed.  He would like to pursue fusion biopsy.  He will follow-up with me after this is completed to review results.  All questions answered.  2. Rising  PSA level As above   Follow Up Instructions: Schedule fusion biopsy at [facility] urology, return for biopsy results   I discussed the assessment and treatment plan with the patient. The patient was provided an opportunity to ask questions and all were answered. The patient agreed  with the plan and demonstrated an understanding of the instructions.   The patient was advised to call back or seek an in-person evaluation if the symptoms worsen or if the condition fails to improve as anticipated.  I provided 15 minutes of non-face-to-face time during this encounter.   Hollice Espy, MD

## 2019-03-06 DIAGNOSIS — R972 Elevated prostate specific antigen [PSA]: Secondary | ICD-10-CM | POA: Diagnosis not present

## 2019-03-10 ENCOUNTER — Other Ambulatory Visit: Payer: Self-pay | Admitting: Urology

## 2019-03-11 ENCOUNTER — Ambulatory Visit (INDEPENDENT_AMBULATORY_CARE_PROVIDER_SITE_OTHER): Payer: Medicare HMO | Admitting: Urology

## 2019-03-11 ENCOUNTER — Encounter: Payer: Self-pay | Admitting: Urology

## 2019-03-11 ENCOUNTER — Other Ambulatory Visit: Payer: Self-pay

## 2019-03-11 VITALS — BP 156/90 | HR 74 | Ht 68.0 in | Wt 166.0 lb

## 2019-03-11 DIAGNOSIS — N5203 Combined arterial insufficiency and corporo-venous occlusive erectile dysfunction: Secondary | ICD-10-CM | POA: Diagnosis not present

## 2019-03-11 DIAGNOSIS — C61 Malignant neoplasm of prostate: Secondary | ICD-10-CM

## 2019-03-11 NOTE — Progress Notes (Signed)
03/11/2019 1:54 PM   ORVAL DORTCH 01/30/1941 [id number]  Referring provider: Adin Hector, MD [facility] [place],  [place] [address]  Chief Complaint  Patient presents with  . Prostate Cancer    HPI: 78 year old male with newly diagnosed there who presents today for follow-up.  Notably, the patient has a history of elevated and rising PSA, most recently up to 9.4 with a doubling time of approximately 2 years.  He has has abnormal rectal exam.  In light of elevated and rising PSA, he underwent a prostate MRI which was relatively nondiagnostic due to distortion from bilateral hip hardware.  Most recently he underwent prostate fusion biopsy on 03/06/2019 which revealed extensive unfavorable intermediate risk prostate cancer.  All cores of the standard 12 core biopsy were positive for Gleason 4+3 cancer involving up to 70% of the tissue.  In addition to this 2/3 of the regions of interest were also biopsied with relatively high volume Gleason 4 pattern up to 90 of the core (70% Gleason 4 pattern).  TRUS vol 21 mL   IPSS    Row Name 03/11/19 1600         International Prostate Symptom Score   How often have you had the sensation of not emptying your bladder?  Not at All     How often have you had to urinate less than every two hours?  Less than 1 in 5 times     How often have you found you stopped and started again several times when you urinated?  Not at All     How often have you found it difficult to postpone urination?  Less than 1 in 5 times     How often have you had a weak urinary stream?  Less than 1 in 5 times     How often have you had to strain to start urination?  Not at All     How many times did you typically get up at night to urinate?  1 Time     Total IPSS Score  4       Quality of Life due to urinary symptoms   If you were to spend the rest of your life with your urinary condition just the way it is now how would you feel  about that?  Pleased        Score:  1-7 Mild 8-19 Moderate 20-35 Severe  SHIM    Row Name 03/11/19 1657         SHIM: Over the last 6 months:   How do you rate your confidence that you could get and keep an erection?  Low     When you had erections with sexual stimulation, how often were your erections hard enough for penetration (entering your partner)?  Almost Never or Never     During sexual intercourse, how often were you able to maintain your erection after you had penetrated (entered) your partner?  Almost Never or Never     During sexual intercourse, how difficult was it to maintain your erection to completion of intercourse?  Extremely Difficult     When you attempted sexual intercourse, how often was it satisfactory for you?  Almost Never or Never       SHIM Total Score   SHIM  6         PMH: Past Medical History:  Diagnosis Date  . Ankle arthropathy 02/24/2014  . Arthritis of foot, degenerative  10/03/2012   Overview:  Subtalar arthritis   . Hereditary hemochromatosis ([place]) 11/03/2015   Overview:  Treated by regular blood donation   . History of kidney stones   . HLD (hyperlipidemia) 11/03/2015  . Localized, primary osteoarthritis of ankle or foot 02/24/2014    Surgical History: Past Surgical History:  Procedure Laterality Date  . ANKLE FUSION Right 2001, 2016  . REVISION TOTAL HIP ARTHROPLASTY  2006   Right 2011, Left 2006    Home Medications:  Allergies as of 03/11/2019   No Known Allergies     Medication List       Accurate as of March 11, 2019 11:59 PM. If you have any questions, ask your nurse or doctor.        CALCIUM-VITAMIN D3 PO Take by mouth.   Glucosamine 500 MG Caps Take by mouth.   Multi-Vitamins Tabs Take by mouth.   simvastatin 20 MG tablet Commonly known as: ZOCOR       Allergies: No Known Allergies  Family History: Family History  Problem Relation Age of Onset  . Bladder Cancer Neg Hx   . Prostate cancer Neg Hx   .  Kidney cancer Neg Hx     Social History:  reports that he has quit smoking. He has never used smokeless tobacco. He reports current alcohol use. He reports that he does not use drugs.  ROS: UROLOGY Hard to postpone urination?: No Burning/pain with urination?: No Get up at night to urinate?: No Leakage of urine?: No Urine stream starts and stops?: No Trouble starting stream?: No Do you have to strain to urinate?: No Blood in urine?: No Urinary tract infection?: No Sexually transmitted disease?: No Injury to kidneys or bladder?: No Painful intercourse?: No Weak stream?: No Erection problems?: No Penile pain?: No  Gastrointestinal Nausea?: No Vomiting?: No Indigestion/heartburn?: No Diarrhea?: No Constipation?: No  Constitutional Fever: No Night sweats?: No Weight loss?: No Fatigue?: No  Skin Skin rash/lesions?: No Itching?: No  Eyes Blurred vision?: No Double vision?: No  Ears/Nose/Throat Sore throat?: No Sinus problems?: No  Hematologic/Lymphatic Swollen glands?: No Easy bruising?: No  Cardiovascular Leg swelling?: No Chest pain?: No  Respiratory Cough?: No Shortness of breath?: No  Endocrine Excessive thirst?: No  Musculoskeletal Back pain?: No Joint pain?: No  Neurological Headaches?: No Dizziness?: No  Psychologic Depression?: No Anxiety?: No  Physical Exam: BP (!) 156/90   Pulse 74   Ht 5\' 8"  (1.727 m)   Wt 166 lb (75.3 kg)   BMI 25.24 kg/m   Constitutional:  Alert and oriented, No acute distress. HEENT: [place] AT, moist mucus membranes.  Trachea midline, no masses. Cardiovascular: No clubbing, cyanosis, or edema. Respiratory: Normal respiratory effort, no increased work of breathing. Skin: No rashes, bruises or suspicious lesions. Neurologic: Grossly intact, no focal deficits, moving all 4 extremities. Psychiatric: Normal mood and affect.   Pertinent Imaging: Prostate MRI from 01/29/2019 was personally reviewed again today.   Again there is significant distortion of his right lower hips with there is no obvious lymphadenopathy, extracapsular extension, or any evidence of locally advanced diease.  Assessment & Plan:    1. Prostate cancer [facility]) Relatively healthy 78 year old male with newly diagnosed unfavorable intermediate risk, high volume  The patient was counseled about the natural history of prostate cancer and the standard treatment options that are available for prostate cancer. It was explained to him how his age and life expectancy, clinical stage, Gleason score, and PSA affect his prognosis, the decision to  proceed with additional staging studies, as well as how that information influences recommended treatment strategies. We discussed the roles for active surveillance, radiation therapy, surgical therapy, androgen deprivation, as well as ablative therapy options for the treatment of prostate cancer as appropriate to his individual cancer situation. We discussed the risks and benefits of these options with regard to their impact on cancer control and also in terms of potential adverse events, complications, and impact on quality of life particularly related to urinary, bowel, and sexual function. The patient was encouraged to ask questions throughout the discussion today and all questions were answered to his stated satisfaction. In addition, the patient was providedwith and/or directed to appropriate resources and literature for further education about prostate cancer treatment options.  Although he 34 years chronological age, he is relatively spry with a few comorbidities.  Definitive management in the form of external beam radiation with ADT x53-month.  Alternatively, hormones alone could also be considered and was discussed.  Risk and benefits of each were discussed in detail.  We will set a lengthy discussion today about the possible side effects of ADT including central obesity, loss of muscle mass, decreased  bone strength, long-term complications including cardiovascular disease, amongst others were discussed.  He was provided literature on this.  Given his high-volume disease and relatively poor quality of prostate MR, I recommended staging with CT scan and bone scan.  If this is negative, he is leaning towards radiation with Lupron x6 months.  Referral to radiation oncology placed.  He will let us know how would like to proceed and will return for ADT once he is made a decision.  Questions answered today.  Conversation was lengthy.  - Ambulatory referral to Radiation Oncology - CT Abdomen Pelvis W Contrast; Future - NM Bone Scan Whole Body; Future  2. Combined arterial insufficiency and corporo-venous occlusive erectile dysfunction Severe baseline erectile dysfunction  Hollice Espy, MD  [place] [address] [place], [place] [address] [phone]  I spent 40 min with this patient of which greater than 50% was spent in counseling and coordination of care with the patient.

## 2019-03-19 ENCOUNTER — Other Ambulatory Visit: Payer: Self-pay

## 2019-03-20 ENCOUNTER — Other Ambulatory Visit: Payer: Self-pay

## 2019-03-20 ENCOUNTER — Encounter: Payer: Self-pay | Admitting: Radiation Oncology

## 2019-03-20 ENCOUNTER — Ambulatory Visit
Admission: RE | Admit: 2019-03-20 | Discharge: 2019-03-20 | Disposition: A | Discharge status: Home / Self Care | Payer: Medicare HMO | Source: Ambulatory Visit | Attending: Radiation Oncology | Admitting: Radiation Oncology

## 2019-03-20 VITALS — BP 191/91 | HR 73 | Temp 97.6°F | Resp 18 | Wt 167.9 lb

## 2019-03-20 DIAGNOSIS — E785 Hyperlipidemia, unspecified: Secondary | ICD-10-CM | POA: Insufficient documentation

## 2019-03-20 DIAGNOSIS — M129 Arthropathy, unspecified: Secondary | ICD-10-CM | POA: Insufficient documentation

## 2019-03-20 DIAGNOSIS — C61 Malignant neoplasm of prostate: Secondary | ICD-10-CM | POA: Insufficient documentation

## 2019-03-20 DIAGNOSIS — Z87442 Personal history of urinary calculi: Secondary | ICD-10-CM | POA: Diagnosis not present

## 2019-03-20 DIAGNOSIS — Z923 Personal history of irradiation: Secondary | ICD-10-CM | POA: Diagnosis not present

## 2019-03-20 DIAGNOSIS — Z79899 Other long term (current) drug therapy: Secondary | ICD-10-CM | POA: Insufficient documentation

## 2019-03-20 DIAGNOSIS — Z87891 Personal history of nicotine dependence: Secondary | ICD-10-CM | POA: Insufficient documentation

## 2019-03-20 NOTE — Consult Note (Signed)
NEW PATIENT EVALUATION  Name: Andrew Walsh  MRN: [id number]  Date:   03/20/2019     DOB: 07-09-1941   This 78 y.o. male patient presents to the clinic for initial evaluation of stage IIb (T2 cN0 M0) Gleason 7 (4+3) adenocarcinoma the prostate presenting with a PSA of 9.4.  REFERRING PHYSICIAN: Hollice Espy, MD  CHIEF COMPLAINT:  Chief Complaint  Patient presents with  . Prostate Cancer    Initial Eval    DIAGNOSIS: The encounter diagnosis was Malignant neoplasm of prostate ([place]).   PREVIOUS INVESTIGATIONS:  Bone scan and CT scan of abdomen pelvis ordered MRI scan reviewed Pathology report reviewed Clinical notes reviewed  HPI: Patient is a extremely young appearing 78 year old male who is been noted to have a rising PSA over the past 2 years most recently 9.4.  His PSA doubling time is approximately 2 years.  On rectal examination he has nodularity in both lobes.  He underwent a transrectal ultrasound-guided biopsy on 03/06/2019 with prostate fusion study showing all 12 cores positive for Gleason 7 (4+3) adenocarcinoma.  His prostate volume was 21 cc.  He has very little lower urinary tract symptoms no sick significant frequency urgency or nocturia.  He has pending a CT scan of his pelvis as well as a bone scan for complete staging purposes.  He is seen today for radiation oncology opinion.  PLANNED TREATMENT REGIMEN: IMRT radiation therapy to both prostate and pelvic nodes along with androgen deprivation therapy  PAST MEDICAL HISTORY:  has a past medical history of Ankle arthropathy (02/24/2014), Arthritis of foot, degenerative (10/03/2012), Hereditary hemochromatosis ([place]) (11/03/2015), History of kidney stones, HLD (hyperlipidemia) (11/03/2015), and Localized, primary osteoarthritis of ankle or foot (02/24/2014).    PAST SURGICAL HISTORY:  Past Surgical History:  Procedure Laterality Date  . ANKLE FUSION Right 2001, 2016  . REVISION TOTAL HIP ARTHROPLASTY  2006   Right 2011,  Left 2006    FAMILY HISTORY: family history is not on file.  SOCIAL HISTORY:  reports that he has quit smoking. He has never used smokeless tobacco. He reports current alcohol use. He reports that he does not use drugs.  ALLERGIES: Patient has no known allergies.  MEDICATIONS:  Current Outpatient Medications  Medication Sig Dispense Refill  . Calcium Carbonate-Vitamin D (CALCIUM-VITAMIN D3 PO) Take by mouth.    . Glucosamine 500 MG CAPS Take by mouth.    . Multiple Vitamin (MULTI-VITAMINS) TABS Take by mouth.    . simvastatin (ZOCOR) 20 MG tablet      No current facility-administered medications for this encounter.     ECOG PERFORMANCE STATUS:  0 - Asymptomatic  REVIEW OF SYSTEMS: Patient denies any weight loss, fatigue, weakness, fever, chills or night sweats. Patient denies any loss of vision, blurred vision. Patient denies any ringing  of the ears or hearing loss. No irregular heartbeat. Patient denies heart murmur or history of fainting. Patient denies any chest pain or pain radiating to her upper extremities. Patient denies any shortness of breath, difficulty breathing at night, cough or hemoptysis. Patient denies any swelling in the lower legs. Patient denies any nausea vomiting, vomiting of blood, or coffee ground material in the vomitus. Patient denies any stomach pain. Patient states has had normal bowel movements no significant constipation or diarrhea. Patient denies any dysuria, hematuria or significant nocturia. Patient denies any problems walking, swelling in the joints or loss of balance. Patient denies any skin changes, loss of hair or loss of weight. Patient denies any excessive  worrying or anxiety or significant depression. Patient denies any problems with insomnia. Patient denies excessive thirst, polyuria, polydipsia. Patient denies any swollen glands, patient denies easy bruising or easy bleeding. Patient denies any recent infections, allergies or URI. Patient "s visual  fields have not changed significantly in recent time.   PHYSICAL EXAM: BP (!) 191/91   Pulse 73   Temp 97.6 F (36.4 C)   Resp 18   Wt 167 lb 14.1 oz (76.1 kg)   BMI 25.53 kg/m  On rectal exam rectal sphincter tone is good prostate is firm throughout both lobes with subtle nodularity throughout sulcus is preserved bilaterally.  No other rectal abnormalities identified.  Well-developed well-nourished patient in NAD. HEENT reveals PERLA, EOMI, discs not visualized.  Oral cavity is clear. No oral mucosal lesions are identified. Neck is clear without evidence of cervical or supraclavicular adenopathy. Lungs are clear to A&P. Cardiac examination is essentially unremarkable with regular rate and rhythm without murmur rub or thrill. Abdomen is benign with no organomegaly or masses noted. Motor sensory and DTR levels are equal and symmetric in the upper and lower extremities. Cranial nerves II through XII are grossly intact. Proprioception is intact. No peripheral adenopathy or edema is identified. No motor or sensory levels are noted. Crude visual fields are within normal range.  LABORATORY DATA: Pathology report reviewed    RADIOLOGY RESULTS: MRI scan reviewed CT scan of pelvis as well as bone scan ordered   IMPRESSION: At least stage IIb adenocarcinoma the prostate in 78 year old male  PLAN: I have run the [facility] nomogram showing only a 7% chance of organ confined disease.  He also has a 53% chance of lymph node involvement.  Based on these facts I have offered treatment to both his prostate and pelvic nodes.  Would treat 8000 cGy to his prostate as well as 5400 cGy to his pelvic nodes using IMRT treatment planning and delivery.  I also have asked Dr. Cherrie Gauze office to start androgen deprivation therapy.  I have ordered a CT scan of the pelvis as well as a bone scan should those imaging studies change our prognosis will o alter our treatment plan.  Risks and benefits of treatment  including increased lower urinary tract symptoms diarrhea fatigue alteration of blood counts skin reaction all were discussed in detail with the patient.  I have personally set up and ordered CT simulation after his imaging studies are performed.  Patient seems to comprehend my treatment plan well.  We have referred him back to Dr. Cherrie Gauze office for a 41-month Lupron injection.  I would like to take this opportunity to thank you for allowing me to participate in the care of your patient.Noreene Filbert, MD

## 2019-03-25 ENCOUNTER — Telehealth: Payer: Self-pay | Admitting: Urology

## 2019-03-25 NOTE — Telephone Encounter (Signed)
NO PA REQUIRED FOR LUPRON  Andrew Walsh

## 2019-04-01 ENCOUNTER — Other Ambulatory Visit: Payer: Self-pay

## 2019-04-01 ENCOUNTER — Ambulatory Visit (INDEPENDENT_AMBULATORY_CARE_PROVIDER_SITE_OTHER): Payer: Medicare HMO | Admitting: Family Medicine

## 2019-04-01 DIAGNOSIS — C61 Malignant neoplasm of prostate: Secondary | ICD-10-CM

## 2019-04-01 MED ORDER — LEUPROLIDE ACETATE (6 MONTH) 45 MG IM KIT
45.0000 mg | PACK | Freq: Once | INTRAMUSCULAR | Status: AC
  Administered 2019-04-01: 45 mg via INTRAMUSCULAR

## 2019-04-01 NOTE — Progress Notes (Signed)
Lupron IM Injection   Due to Prostate Cancer patient is present today for a Lupron Injection.  Medication: Lupron 6 month Dose: 45 mg  Location: left upper outer buttocks Lot: [id number] Exp: 04/22/2021  Patient tolerated well, no complications were noted  Performed by: Elberta Leatherwood, CMA  Follow up: 6 month ov with Dr. Erlene Quan

## 2019-04-02 ENCOUNTER — Ambulatory Visit
Admission: RE | Admit: 2019-04-02 | Discharge: 2019-04-02 | Disposition: A | Discharge status: Home / Self Care | Payer: Medicare HMO | Source: Ambulatory Visit | Attending: Urology | Admitting: Urology

## 2019-04-02 ENCOUNTER — Encounter (HOSPITAL_BASED_OUTPATIENT_CLINIC_OR_DEPARTMENT_OTHER)
Admission: RE | Admit: 2019-04-02 | Discharge: 2019-04-02 | Disposition: A | Discharge status: Home / Self Care | Payer: Medicare HMO | Source: Ambulatory Visit | Attending: Urology | Admitting: Urology

## 2019-04-02 ENCOUNTER — Encounter
Admission: RE | Admit: 2019-04-02 | Discharge: 2019-04-02 | Disposition: A | Discharge status: Home / Self Care | Payer: Medicare HMO | Source: Ambulatory Visit | Attending: Urology | Admitting: Urology

## 2019-04-02 ENCOUNTER — Other Ambulatory Visit: Payer: Self-pay

## 2019-04-02 DIAGNOSIS — C61 Malignant neoplasm of prostate: Secondary | ICD-10-CM | POA: Insufficient documentation

## 2019-04-02 DIAGNOSIS — Z96642 Presence of left artificial hip joint: Secondary | ICD-10-CM | POA: Diagnosis not present

## 2019-04-02 DIAGNOSIS — Z471 Aftercare following joint replacement surgery: Secondary | ICD-10-CM | POA: Diagnosis not present

## 2019-04-02 DIAGNOSIS — M47815 Spondylosis without myelopathy or radiculopathy, thoracolumbar region: Secondary | ICD-10-CM | POA: Diagnosis not present

## 2019-04-02 DIAGNOSIS — M4185 Other forms of scoliosis, thoracolumbar region: Secondary | ICD-10-CM | POA: Diagnosis not present

## 2019-04-02 HISTORY — DX: Malignant (primary) neoplasm, unspecified: C80.1

## 2019-04-02 LAB — POCT I-STAT CREATININE: Creatinine, Ser: 0.9 mg/dL (ref 0.61–1.24)

## 2019-04-02 MED ORDER — TECHNETIUM TC 99M MEDRONATE IV KIT
23.9800 | PACK | Freq: Once | INTRAVENOUS | Status: AC | PRN
  Administered 2019-04-02: 23.98 via INTRAVENOUS

## 2019-04-02 MED ORDER — IOHEXOL 300 MG/ML  SOLN
100.0000 mL | Freq: Once | INTRAMUSCULAR | Status: AC | PRN
  Administered 2019-04-02: 10:00:00 100 mL via INTRAVENOUS

## 2019-04-03 ENCOUNTER — Ambulatory Visit
Admission: RE | Admit: 2019-04-03 | Discharge: 2019-04-03 | Disposition: A | Discharge status: Home / Self Care | Payer: Medicare HMO | Source: Ambulatory Visit | Attending: Radiation Oncology | Admitting: Radiation Oncology

## 2019-04-03 ENCOUNTER — Telehealth: Payer: Self-pay | Admitting: *Deleted

## 2019-04-03 ENCOUNTER — Other Ambulatory Visit: Payer: Self-pay

## 2019-04-03 DIAGNOSIS — Z510 Encounter for antineoplastic radiation therapy: Secondary | ICD-10-CM | POA: Diagnosis not present

## 2019-04-03 DIAGNOSIS — C61 Malignant neoplasm of prostate: Secondary | ICD-10-CM | POA: Insufficient documentation

## 2019-04-03 NOTE — Telephone Encounter (Addendum)
Left patient a VM to return call  ----- Message from Hollice Espy, MD sent at 04/03/2019  9:53 AM EDT ----- Please let this patient know that his CT and bone scan looks fine.  There is some incidental findings that are not clinically significant.  It does not look like there is any evidence of metastatic disease.  Awesome news.  Hollice Espy, MD

## 2019-04-03 NOTE — Telephone Encounter (Signed)
Pt called back and I read the note from Dr Erlene Quan to him. He voiced understanding.

## 2019-04-07 ENCOUNTER — Telehealth: Payer: Self-pay | Admitting: Urology

## 2019-04-07 DIAGNOSIS — C61 Malignant neoplasm of prostate: Secondary | ICD-10-CM | POA: Diagnosis not present

## 2019-04-07 DIAGNOSIS — Z510 Encounter for antineoplastic radiation therapy: Secondary | ICD-10-CM | POA: Diagnosis not present

## 2019-04-07 NOTE — Telephone Encounter (Signed)
Spoke with patient-informed him of imaging results. Was concerned about the results, explained in detail regarding Dr. Cherrie Gauze note. Offered a virtual appointment to discuss further with Dr. Zada Girt. Verbalized understanding.

## 2019-04-07 NOTE — Telephone Encounter (Signed)
Pt would like a call back to discuss Bone Scan results. Please advise.

## 2019-04-11 ENCOUNTER — Other Ambulatory Visit: Payer: Self-pay | Admitting: *Deleted

## 2019-04-11 DIAGNOSIS — C61 Malignant neoplasm of prostate: Secondary | ICD-10-CM

## 2019-04-14 ENCOUNTER — Other Ambulatory Visit: Payer: Self-pay

## 2019-04-14 ENCOUNTER — Ambulatory Visit
Admission: RE | Admit: 2019-04-14 | Discharge: 2019-04-14 | Disposition: A | Discharge status: Home / Self Care | Payer: Medicare HMO | Source: Ambulatory Visit | Attending: Radiation Oncology | Admitting: Radiation Oncology

## 2019-04-14 DIAGNOSIS — C61 Malignant neoplasm of prostate: Secondary | ICD-10-CM | POA: Insufficient documentation

## 2019-04-14 DIAGNOSIS — Z510 Encounter for antineoplastic radiation therapy: Secondary | ICD-10-CM | POA: Insufficient documentation

## 2019-04-15 ENCOUNTER — Other Ambulatory Visit: Payer: Self-pay

## 2019-04-15 ENCOUNTER — Ambulatory Visit
Admission: RE | Admit: 2019-04-15 | Discharge: 2019-04-15 | Disposition: A | Discharge status: Home / Self Care | Payer: Medicare HMO | Source: Ambulatory Visit | Attending: Radiation Oncology | Admitting: Radiation Oncology

## 2019-04-15 DIAGNOSIS — C61 Malignant neoplasm of prostate: Secondary | ICD-10-CM | POA: Diagnosis not present

## 2019-04-15 DIAGNOSIS — Z510 Encounter for antineoplastic radiation therapy: Secondary | ICD-10-CM | POA: Diagnosis not present

## 2019-04-16 ENCOUNTER — Ambulatory Visit
Admission: RE | Admit: 2019-04-16 | Discharge: 2019-04-16 | Disposition: A | Discharge status: Home / Self Care | Payer: Medicare HMO | Source: Ambulatory Visit | Attending: Radiation Oncology | Admitting: Radiation Oncology

## 2019-04-16 ENCOUNTER — Other Ambulatory Visit: Payer: Self-pay

## 2019-04-16 DIAGNOSIS — Z510 Encounter for antineoplastic radiation therapy: Secondary | ICD-10-CM | POA: Diagnosis not present

## 2019-04-16 DIAGNOSIS — C61 Malignant neoplasm of prostate: Secondary | ICD-10-CM | POA: Diagnosis not present

## 2019-04-17 ENCOUNTER — Other Ambulatory Visit: Payer: Self-pay

## 2019-04-17 ENCOUNTER — Ambulatory Visit
Admission: RE | Admit: 2019-04-17 | Discharge: 2019-04-17 | Disposition: A | Discharge status: Home / Self Care | Payer: Medicare HMO | Source: Ambulatory Visit | Attending: Radiation Oncology | Admitting: Radiation Oncology

## 2019-04-17 DIAGNOSIS — Z510 Encounter for antineoplastic radiation therapy: Secondary | ICD-10-CM | POA: Diagnosis not present

## 2019-04-17 DIAGNOSIS — C61 Malignant neoplasm of prostate: Secondary | ICD-10-CM | POA: Diagnosis not present

## 2019-04-18 ENCOUNTER — Other Ambulatory Visit: Payer: Self-pay

## 2019-04-18 ENCOUNTER — Ambulatory Visit
Admission: RE | Admit: 2019-04-18 | Discharge: 2019-04-18 | Disposition: A | Discharge status: Home / Self Care | Payer: Medicare HMO | Source: Ambulatory Visit | Attending: Radiation Oncology | Admitting: Radiation Oncology

## 2019-04-18 DIAGNOSIS — Z510 Encounter for antineoplastic radiation therapy: Secondary | ICD-10-CM | POA: Diagnosis not present

## 2019-04-18 DIAGNOSIS — C61 Malignant neoplasm of prostate: Secondary | ICD-10-CM | POA: Diagnosis not present

## 2019-04-21 ENCOUNTER — Ambulatory Visit
Admission: RE | Admit: 2019-04-21 | Discharge: 2019-04-21 | Disposition: A | Discharge status: Home / Self Care | Payer: Medicare HMO | Source: Ambulatory Visit | Attending: Radiation Oncology | Admitting: Radiation Oncology

## 2019-04-21 ENCOUNTER — Other Ambulatory Visit: Payer: Self-pay

## 2019-04-21 DIAGNOSIS — C61 Malignant neoplasm of prostate: Secondary | ICD-10-CM | POA: Diagnosis not present

## 2019-04-21 DIAGNOSIS — Z510 Encounter for antineoplastic radiation therapy: Secondary | ICD-10-CM | POA: Diagnosis not present

## 2019-04-22 ENCOUNTER — Other Ambulatory Visit: Payer: Self-pay

## 2019-04-22 ENCOUNTER — Ambulatory Visit
Admission: RE | Admit: 2019-04-22 | Discharge: 2019-04-22 | Disposition: A | Discharge status: Home / Self Care | Payer: Medicare HMO | Source: Ambulatory Visit | Attending: Radiation Oncology | Admitting: Radiation Oncology

## 2019-04-22 DIAGNOSIS — Z510 Encounter for antineoplastic radiation therapy: Secondary | ICD-10-CM | POA: Diagnosis not present

## 2019-04-22 DIAGNOSIS — C61 Malignant neoplasm of prostate: Secondary | ICD-10-CM | POA: Diagnosis not present

## 2019-04-23 ENCOUNTER — Other Ambulatory Visit: Payer: Self-pay

## 2019-04-23 ENCOUNTER — Ambulatory Visit
Admission: RE | Admit: 2019-04-23 | Discharge: 2019-04-23 | Disposition: A | Discharge status: Home / Self Care | Payer: Medicare HMO | Source: Ambulatory Visit | Attending: Radiation Oncology | Admitting: Radiation Oncology

## 2019-04-23 DIAGNOSIS — Z510 Encounter for antineoplastic radiation therapy: Secondary | ICD-10-CM | POA: Diagnosis not present

## 2019-04-23 DIAGNOSIS — C61 Malignant neoplasm of prostate: Secondary | ICD-10-CM | POA: Diagnosis not present

## 2019-04-24 ENCOUNTER — Other Ambulatory Visit: Payer: Self-pay

## 2019-04-24 ENCOUNTER — Ambulatory Visit
Admission: RE | Admit: 2019-04-24 | Discharge: 2019-04-24 | Disposition: A | Discharge status: Home / Self Care | Payer: Medicare HMO | Source: Ambulatory Visit | Attending: Radiation Oncology | Admitting: Radiation Oncology

## 2019-04-24 DIAGNOSIS — C61 Malignant neoplasm of prostate: Secondary | ICD-10-CM | POA: Diagnosis not present

## 2019-04-24 DIAGNOSIS — Z510 Encounter for antineoplastic radiation therapy: Secondary | ICD-10-CM | POA: Diagnosis not present

## 2019-04-25 ENCOUNTER — Ambulatory Visit
Admission: RE | Admit: 2019-04-25 | Discharge: 2019-04-25 | Disposition: A | Discharge status: Home / Self Care | Payer: Medicare HMO | Source: Ambulatory Visit | Attending: Radiation Oncology | Admitting: Radiation Oncology

## 2019-04-25 ENCOUNTER — Other Ambulatory Visit: Payer: Self-pay

## 2019-04-25 DIAGNOSIS — Z510 Encounter for antineoplastic radiation therapy: Secondary | ICD-10-CM | POA: Diagnosis not present

## 2019-04-25 DIAGNOSIS — C61 Malignant neoplasm of prostate: Secondary | ICD-10-CM | POA: Diagnosis not present

## 2019-04-28 ENCOUNTER — Other Ambulatory Visit: Payer: Self-pay

## 2019-04-28 ENCOUNTER — Ambulatory Visit
Admission: RE | Admit: 2019-04-28 | Discharge: 2019-04-28 | Disposition: A | Discharge status: Home / Self Care | Payer: Medicare HMO | Source: Ambulatory Visit | Attending: Radiation Oncology | Admitting: Radiation Oncology

## 2019-04-28 DIAGNOSIS — Z510 Encounter for antineoplastic radiation therapy: Secondary | ICD-10-CM | POA: Diagnosis not present

## 2019-04-28 DIAGNOSIS — C61 Malignant neoplasm of prostate: Secondary | ICD-10-CM | POA: Diagnosis not present

## 2019-04-29 ENCOUNTER — Ambulatory Visit
Admission: RE | Admit: 2019-04-29 | Discharge: 2019-04-29 | Disposition: A | Discharge status: Home / Self Care | Payer: Medicare HMO | Source: Ambulatory Visit | Attending: Radiation Oncology | Admitting: Radiation Oncology

## 2019-04-29 ENCOUNTER — Other Ambulatory Visit: Payer: Self-pay

## 2019-04-29 DIAGNOSIS — C61 Malignant neoplasm of prostate: Secondary | ICD-10-CM | POA: Diagnosis not present

## 2019-04-29 DIAGNOSIS — Z510 Encounter for antineoplastic radiation therapy: Secondary | ICD-10-CM | POA: Diagnosis not present

## 2019-04-30 ENCOUNTER — Ambulatory Visit
Admission: RE | Admit: 2019-04-30 | Discharge: 2019-04-30 | Disposition: A | Discharge status: Home / Self Care | Payer: Medicare HMO | Source: Ambulatory Visit | Attending: Radiation Oncology | Admitting: Radiation Oncology

## 2019-04-30 ENCOUNTER — Other Ambulatory Visit: Payer: Self-pay

## 2019-04-30 DIAGNOSIS — C61 Malignant neoplasm of prostate: Secondary | ICD-10-CM | POA: Diagnosis not present

## 2019-04-30 DIAGNOSIS — Z510 Encounter for antineoplastic radiation therapy: Secondary | ICD-10-CM | POA: Diagnosis not present

## 2019-05-01 ENCOUNTER — Other Ambulatory Visit: Payer: Self-pay

## 2019-05-01 ENCOUNTER — Ambulatory Visit
Admission: RE | Admit: 2019-05-01 | Discharge: 2019-05-01 | Disposition: A | Discharge status: Home / Self Care | Payer: Medicare HMO | Source: Ambulatory Visit | Attending: Radiation Oncology | Admitting: Radiation Oncology

## 2019-05-01 ENCOUNTER — Inpatient Hospital Stay: Payer: Medicare HMO | Attending: Radiation Oncology

## 2019-05-01 DIAGNOSIS — Z510 Encounter for antineoplastic radiation therapy: Secondary | ICD-10-CM | POA: Diagnosis not present

## 2019-05-01 DIAGNOSIS — C61 Malignant neoplasm of prostate: Secondary | ICD-10-CM | POA: Diagnosis not present

## 2019-05-01 LAB — CBC
HCT: 38.9 % — ABNORMAL LOW (ref 39.0–52.0)
Hemoglobin: 13.5 g/dL (ref 13.0–17.0)
MCH: 35.3 pg — ABNORMAL HIGH (ref 26.0–34.0)
MCHC: 34.7 g/dL (ref 30.0–36.0)
MCV: 101.8 fL — ABNORMAL HIGH (ref 80.0–100.0)
Platelets: 174 10*3/uL (ref 150–400)
RBC: 3.82 MIL/uL — ABNORMAL LOW (ref 4.22–5.81)
RDW: 12.2 % (ref 11.5–15.5)
WBC: 5 10*3/uL (ref 4.0–10.5)
nRBC: 0 % (ref 0.0–0.2)

## 2019-05-02 ENCOUNTER — Other Ambulatory Visit: Payer: Self-pay

## 2019-05-02 ENCOUNTER — Ambulatory Visit
Admission: RE | Admit: 2019-05-02 | Discharge: 2019-05-02 | Disposition: A | Discharge status: Home / Self Care | Payer: Medicare HMO | Source: Ambulatory Visit | Attending: Radiation Oncology | Admitting: Radiation Oncology

## 2019-05-02 DIAGNOSIS — Z510 Encounter for antineoplastic radiation therapy: Secondary | ICD-10-CM | POA: Diagnosis not present

## 2019-05-02 DIAGNOSIS — C61 Malignant neoplasm of prostate: Secondary | ICD-10-CM | POA: Diagnosis not present

## 2019-05-05 ENCOUNTER — Other Ambulatory Visit: Payer: Self-pay

## 2019-05-05 ENCOUNTER — Ambulatory Visit
Admission: RE | Admit: 2019-05-05 | Discharge: 2019-05-05 | Disposition: A | Discharge status: Home / Self Care | Payer: Medicare HMO | Source: Ambulatory Visit | Attending: Radiation Oncology | Admitting: Radiation Oncology

## 2019-05-05 DIAGNOSIS — Z510 Encounter for antineoplastic radiation therapy: Secondary | ICD-10-CM | POA: Diagnosis not present

## 2019-05-05 DIAGNOSIS — C61 Malignant neoplasm of prostate: Secondary | ICD-10-CM | POA: Diagnosis not present

## 2019-05-06 ENCOUNTER — Ambulatory Visit
Admission: RE | Admit: 2019-05-06 | Discharge: 2019-05-06 | Disposition: A | Discharge status: Home / Self Care | Payer: Medicare HMO | Source: Ambulatory Visit | Attending: Radiation Oncology | Admitting: Radiation Oncology

## 2019-05-06 ENCOUNTER — Other Ambulatory Visit: Payer: Self-pay

## 2019-05-06 DIAGNOSIS — C61 Malignant neoplasm of prostate: Secondary | ICD-10-CM | POA: Diagnosis not present

## 2019-05-06 DIAGNOSIS — Z510 Encounter for antineoplastic radiation therapy: Secondary | ICD-10-CM | POA: Diagnosis not present

## 2019-05-07 ENCOUNTER — Ambulatory Visit
Admission: RE | Admit: 2019-05-07 | Discharge: 2019-05-07 | Disposition: A | Discharge status: Home / Self Care | Payer: Medicare HMO | Source: Ambulatory Visit | Attending: Radiation Oncology | Admitting: Radiation Oncology

## 2019-05-07 ENCOUNTER — Other Ambulatory Visit: Payer: Self-pay

## 2019-05-07 DIAGNOSIS — C61 Malignant neoplasm of prostate: Secondary | ICD-10-CM | POA: Diagnosis not present

## 2019-05-07 DIAGNOSIS — Z510 Encounter for antineoplastic radiation therapy: Secondary | ICD-10-CM | POA: Diagnosis not present

## 2019-05-08 ENCOUNTER — Ambulatory Visit
Admission: RE | Admit: 2019-05-08 | Discharge: 2019-05-08 | Disposition: A | Discharge status: Home / Self Care | Payer: Medicare HMO | Source: Ambulatory Visit | Attending: Radiation Oncology | Admitting: Radiation Oncology

## 2019-05-08 ENCOUNTER — Other Ambulatory Visit: Payer: Self-pay

## 2019-05-08 DIAGNOSIS — C61 Malignant neoplasm of prostate: Secondary | ICD-10-CM | POA: Diagnosis not present

## 2019-05-08 DIAGNOSIS — Z510 Encounter for antineoplastic radiation therapy: Secondary | ICD-10-CM | POA: Diagnosis not present

## 2019-05-09 ENCOUNTER — Other Ambulatory Visit: Payer: Self-pay

## 2019-05-09 ENCOUNTER — Ambulatory Visit
Admission: RE | Admit: 2019-05-09 | Discharge: 2019-05-09 | Disposition: A | Discharge status: Home / Self Care | Payer: Medicare HMO | Source: Ambulatory Visit | Attending: Radiation Oncology | Admitting: Radiation Oncology

## 2019-05-09 DIAGNOSIS — Z510 Encounter for antineoplastic radiation therapy: Secondary | ICD-10-CM | POA: Diagnosis not present

## 2019-05-09 DIAGNOSIS — C61 Malignant neoplasm of prostate: Secondary | ICD-10-CM | POA: Diagnosis not present

## 2019-05-12 ENCOUNTER — Ambulatory Visit
Admission: RE | Admit: 2019-05-12 | Discharge: 2019-05-12 | Disposition: A | Discharge status: Home / Self Care | Payer: Medicare HMO | Source: Ambulatory Visit | Attending: Radiation Oncology | Admitting: Radiation Oncology

## 2019-05-12 ENCOUNTER — Other Ambulatory Visit: Payer: Self-pay

## 2019-05-12 DIAGNOSIS — Z510 Encounter for antineoplastic radiation therapy: Secondary | ICD-10-CM | POA: Diagnosis not present

## 2019-05-12 DIAGNOSIS — C61 Malignant neoplasm of prostate: Secondary | ICD-10-CM | POA: Diagnosis not present

## 2019-05-13 ENCOUNTER — Ambulatory Visit
Admission: RE | Admit: 2019-05-13 | Discharge: 2019-05-13 | Disposition: A | Discharge status: Home / Self Care | Payer: Medicare HMO | Source: Ambulatory Visit | Attending: Radiation Oncology | Admitting: Radiation Oncology

## 2019-05-13 ENCOUNTER — Other Ambulatory Visit: Payer: Self-pay

## 2019-05-13 DIAGNOSIS — C61 Malignant neoplasm of prostate: Secondary | ICD-10-CM | POA: Insufficient documentation

## 2019-05-13 DIAGNOSIS — Z510 Encounter for antineoplastic radiation therapy: Secondary | ICD-10-CM | POA: Insufficient documentation

## 2019-05-14 ENCOUNTER — Other Ambulatory Visit: Payer: Self-pay

## 2019-05-14 ENCOUNTER — Ambulatory Visit
Admission: RE | Admit: 2019-05-14 | Discharge: 2019-05-14 | Disposition: A | Discharge status: Home / Self Care | Payer: Medicare HMO | Source: Ambulatory Visit | Attending: Radiation Oncology | Admitting: Radiation Oncology

## 2019-05-14 DIAGNOSIS — Z510 Encounter for antineoplastic radiation therapy: Secondary | ICD-10-CM | POA: Diagnosis not present

## 2019-05-14 DIAGNOSIS — C61 Malignant neoplasm of prostate: Secondary | ICD-10-CM | POA: Diagnosis not present

## 2019-05-15 ENCOUNTER — Ambulatory Visit
Admission: RE | Admit: 2019-05-15 | Discharge: 2019-05-15 | Disposition: A | Discharge status: Home / Self Care | Payer: Medicare HMO | Source: Ambulatory Visit | Attending: Radiation Oncology | Admitting: Radiation Oncology

## 2019-05-15 ENCOUNTER — Inpatient Hospital Stay: Payer: Medicare HMO | Attending: Radiation Oncology

## 2019-05-15 ENCOUNTER — Other Ambulatory Visit: Payer: Self-pay

## 2019-05-15 DIAGNOSIS — C61 Malignant neoplasm of prostate: Secondary | ICD-10-CM | POA: Diagnosis not present

## 2019-05-15 DIAGNOSIS — Z510 Encounter for antineoplastic radiation therapy: Secondary | ICD-10-CM | POA: Diagnosis not present

## 2019-05-15 LAB — CBC
HCT: 38 % — ABNORMAL LOW (ref 39.0–52.0)
Hemoglobin: 13 g/dL (ref 13.0–17.0)
MCH: 34.8 pg — ABNORMAL HIGH (ref 26.0–34.0)
MCHC: 34.2 g/dL (ref 30.0–36.0)
MCV: 101.6 fL — ABNORMAL HIGH (ref 80.0–100.0)
Platelets: 188 10*3/uL (ref 150–400)
RBC: 3.74 MIL/uL — ABNORMAL LOW (ref 4.22–5.81)
RDW: 12.3 % (ref 11.5–15.5)
WBC: 4.5 10*3/uL (ref 4.0–10.5)
nRBC: 0 % (ref 0.0–0.2)

## 2019-05-16 ENCOUNTER — Ambulatory Visit
Admission: RE | Admit: 2019-05-16 | Discharge: 2019-05-16 | Disposition: A | Discharge status: Home / Self Care | Payer: Medicare HMO | Source: Ambulatory Visit | Attending: Radiation Oncology | Admitting: Radiation Oncology

## 2019-05-16 ENCOUNTER — Other Ambulatory Visit: Payer: Self-pay

## 2019-05-16 DIAGNOSIS — C61 Malignant neoplasm of prostate: Secondary | ICD-10-CM | POA: Diagnosis not present

## 2019-05-16 DIAGNOSIS — Z510 Encounter for antineoplastic radiation therapy: Secondary | ICD-10-CM | POA: Diagnosis not present

## 2019-05-20 ENCOUNTER — Other Ambulatory Visit: Payer: Self-pay

## 2019-05-20 ENCOUNTER — Ambulatory Visit
Admission: RE | Admit: 2019-05-20 | Discharge: 2019-05-20 | Disposition: A | Discharge status: Home / Self Care | Payer: Medicare HMO | Source: Ambulatory Visit | Attending: Radiation Oncology | Admitting: Radiation Oncology

## 2019-05-20 DIAGNOSIS — Z510 Encounter for antineoplastic radiation therapy: Secondary | ICD-10-CM | POA: Diagnosis not present

## 2019-05-20 DIAGNOSIS — C61 Malignant neoplasm of prostate: Secondary | ICD-10-CM | POA: Diagnosis not present

## 2019-05-21 ENCOUNTER — Ambulatory Visit
Admission: RE | Admit: 2019-05-21 | Discharge: 2019-05-21 | Disposition: A | Discharge status: Home / Self Care | Payer: Medicare HMO | Source: Ambulatory Visit | Attending: Radiation Oncology | Admitting: Radiation Oncology

## 2019-05-21 ENCOUNTER — Other Ambulatory Visit: Payer: Self-pay

## 2019-05-21 DIAGNOSIS — C61 Malignant neoplasm of prostate: Secondary | ICD-10-CM | POA: Diagnosis not present

## 2019-05-21 DIAGNOSIS — Z510 Encounter for antineoplastic radiation therapy: Secondary | ICD-10-CM | POA: Diagnosis not present

## 2019-05-22 ENCOUNTER — Other Ambulatory Visit: Payer: Self-pay

## 2019-05-22 ENCOUNTER — Ambulatory Visit
Admission: RE | Admit: 2019-05-22 | Discharge: 2019-05-22 | Disposition: A | Discharge status: Home / Self Care | Payer: Medicare HMO | Source: Ambulatory Visit | Attending: Radiation Oncology | Admitting: Radiation Oncology

## 2019-05-22 DIAGNOSIS — Z510 Encounter for antineoplastic radiation therapy: Secondary | ICD-10-CM | POA: Diagnosis not present

## 2019-05-22 DIAGNOSIS — C61 Malignant neoplasm of prostate: Secondary | ICD-10-CM | POA: Diagnosis not present

## 2019-05-23 ENCOUNTER — Other Ambulatory Visit: Payer: Self-pay

## 2019-05-23 ENCOUNTER — Ambulatory Visit
Admission: RE | Admit: 2019-05-23 | Discharge: 2019-05-23 | Disposition: A | Discharge status: Home / Self Care | Payer: Medicare HMO | Source: Ambulatory Visit | Attending: Radiation Oncology | Admitting: Radiation Oncology

## 2019-05-23 DIAGNOSIS — C61 Malignant neoplasm of prostate: Secondary | ICD-10-CM | POA: Diagnosis not present

## 2019-05-23 DIAGNOSIS — Z510 Encounter for antineoplastic radiation therapy: Secondary | ICD-10-CM | POA: Diagnosis not present

## 2019-05-26 ENCOUNTER — Ambulatory Visit
Admission: RE | Admit: 2019-05-26 | Discharge: 2019-05-26 | Disposition: A | Discharge status: Home / Self Care | Payer: Medicare HMO | Source: Ambulatory Visit | Attending: Radiation Oncology | Admitting: Radiation Oncology

## 2019-05-26 ENCOUNTER — Other Ambulatory Visit: Payer: Self-pay

## 2019-05-26 DIAGNOSIS — Z510 Encounter for antineoplastic radiation therapy: Secondary | ICD-10-CM | POA: Diagnosis not present

## 2019-05-26 DIAGNOSIS — C61 Malignant neoplasm of prostate: Secondary | ICD-10-CM | POA: Diagnosis not present

## 2019-05-27 ENCOUNTER — Ambulatory Visit
Admission: RE | Admit: 2019-05-27 | Discharge: 2019-05-27 | Disposition: A | Discharge status: Home / Self Care | Payer: Medicare HMO | Source: Ambulatory Visit | Attending: Radiation Oncology | Admitting: Radiation Oncology

## 2019-05-27 ENCOUNTER — Other Ambulatory Visit: Payer: Self-pay

## 2019-05-27 DIAGNOSIS — Z510 Encounter for antineoplastic radiation therapy: Secondary | ICD-10-CM | POA: Diagnosis not present

## 2019-05-27 DIAGNOSIS — C61 Malignant neoplasm of prostate: Secondary | ICD-10-CM | POA: Diagnosis not present

## 2019-05-28 ENCOUNTER — Other Ambulatory Visit: Payer: Self-pay

## 2019-05-28 ENCOUNTER — Inpatient Hospital Stay: Payer: Medicare HMO

## 2019-05-28 ENCOUNTER — Ambulatory Visit
Admission: RE | Admit: 2019-05-28 | Discharge: 2019-05-28 | Disposition: A | Discharge status: Home / Self Care | Payer: Medicare HMO | Source: Ambulatory Visit | Attending: Radiation Oncology | Admitting: Radiation Oncology

## 2019-05-28 DIAGNOSIS — C61 Malignant neoplasm of prostate: Secondary | ICD-10-CM

## 2019-05-28 DIAGNOSIS — Z510 Encounter for antineoplastic radiation therapy: Secondary | ICD-10-CM | POA: Diagnosis not present

## 2019-05-28 LAB — CBC
HCT: 39 % (ref 39.0–52.0)
Hemoglobin: 13.6 g/dL (ref 13.0–17.0)
MCH: 35.6 pg — ABNORMAL HIGH (ref 26.0–34.0)
MCHC: 34.9 g/dL (ref 30.0–36.0)
MCV: 102.1 fL — ABNORMAL HIGH (ref 80.0–100.0)
Platelets: 193 10*3/uL (ref 150–400)
RBC: 3.82 MIL/uL — ABNORMAL LOW (ref 4.22–5.81)
RDW: 12.5 % (ref 11.5–15.5)
WBC: 4.6 10*3/uL (ref 4.0–10.5)
nRBC: 0 % (ref 0.0–0.2)

## 2019-05-29 ENCOUNTER — Other Ambulatory Visit: Payer: Self-pay

## 2019-05-29 ENCOUNTER — Ambulatory Visit
Admission: RE | Admit: 2019-05-29 | Discharge: 2019-05-29 | Disposition: A | Discharge status: Home / Self Care | Payer: Medicare HMO | Source: Ambulatory Visit | Attending: Radiation Oncology | Admitting: Radiation Oncology

## 2019-05-29 DIAGNOSIS — C61 Malignant neoplasm of prostate: Secondary | ICD-10-CM | POA: Diagnosis not present

## 2019-05-29 DIAGNOSIS — Z510 Encounter for antineoplastic radiation therapy: Secondary | ICD-10-CM | POA: Diagnosis not present

## 2019-05-30 ENCOUNTER — Ambulatory Visit
Admission: RE | Admit: 2019-05-30 | Discharge: 2019-05-30 | Disposition: A | Discharge status: Home / Self Care | Payer: Medicare HMO | Source: Ambulatory Visit | Attending: Radiation Oncology | Admitting: Radiation Oncology

## 2019-05-30 ENCOUNTER — Other Ambulatory Visit: Payer: Self-pay

## 2019-05-30 DIAGNOSIS — Z510 Encounter for antineoplastic radiation therapy: Secondary | ICD-10-CM | POA: Diagnosis not present

## 2019-05-30 DIAGNOSIS — C61 Malignant neoplasm of prostate: Secondary | ICD-10-CM | POA: Diagnosis not present

## 2019-06-02 ENCOUNTER — Ambulatory Visit
Admission: RE | Admit: 2019-06-02 | Discharge: 2019-06-02 | Disposition: A | Discharge status: Home / Self Care | Payer: Medicare HMO | Source: Ambulatory Visit | Attending: Radiation Oncology | Admitting: Radiation Oncology

## 2019-06-02 ENCOUNTER — Other Ambulatory Visit: Payer: Self-pay

## 2019-06-02 DIAGNOSIS — C61 Malignant neoplasm of prostate: Secondary | ICD-10-CM | POA: Diagnosis not present

## 2019-06-02 DIAGNOSIS — Z510 Encounter for antineoplastic radiation therapy: Secondary | ICD-10-CM | POA: Diagnosis not present

## 2019-06-03 ENCOUNTER — Ambulatory Visit
Admission: RE | Admit: 2019-06-03 | Discharge: 2019-06-03 | Disposition: A | Discharge status: Home / Self Care | Payer: Medicare HMO | Source: Ambulatory Visit | Attending: Radiation Oncology | Admitting: Radiation Oncology

## 2019-06-03 ENCOUNTER — Other Ambulatory Visit: Payer: Self-pay

## 2019-06-03 DIAGNOSIS — Z510 Encounter for antineoplastic radiation therapy: Secondary | ICD-10-CM | POA: Diagnosis not present

## 2019-06-03 DIAGNOSIS — C61 Malignant neoplasm of prostate: Secondary | ICD-10-CM | POA: Diagnosis not present

## 2019-06-04 ENCOUNTER — Ambulatory Visit
Admission: RE | Admit: 2019-06-04 | Discharge: 2019-06-04 | Disposition: A | Discharge status: Home / Self Care | Payer: Medicare HMO | Source: Ambulatory Visit | Attending: Radiation Oncology | Admitting: Radiation Oncology

## 2019-06-04 ENCOUNTER — Other Ambulatory Visit: Payer: Self-pay

## 2019-06-04 DIAGNOSIS — Z510 Encounter for antineoplastic radiation therapy: Secondary | ICD-10-CM | POA: Diagnosis not present

## 2019-06-04 DIAGNOSIS — C61 Malignant neoplasm of prostate: Secondary | ICD-10-CM | POA: Diagnosis not present

## 2019-06-05 ENCOUNTER — Ambulatory Visit
Admission: RE | Admit: 2019-06-05 | Discharge: 2019-06-05 | Disposition: A | Discharge status: Home / Self Care | Payer: Medicare HMO | Source: Ambulatory Visit | Attending: Radiation Oncology | Admitting: Radiation Oncology

## 2019-06-05 ENCOUNTER — Other Ambulatory Visit: Payer: Self-pay

## 2019-06-05 DIAGNOSIS — Z510 Encounter for antineoplastic radiation therapy: Secondary | ICD-10-CM | POA: Diagnosis not present

## 2019-06-05 DIAGNOSIS — C61 Malignant neoplasm of prostate: Secondary | ICD-10-CM | POA: Diagnosis not present

## 2019-06-06 ENCOUNTER — Other Ambulatory Visit: Payer: Self-pay

## 2019-06-06 ENCOUNTER — Ambulatory Visit
Admission: RE | Admit: 2019-06-06 | Discharge: 2019-06-06 | Disposition: A | Discharge status: Home / Self Care | Payer: Medicare HMO | Source: Ambulatory Visit | Attending: Radiation Oncology | Admitting: Radiation Oncology

## 2019-06-06 DIAGNOSIS — C61 Malignant neoplasm of prostate: Secondary | ICD-10-CM | POA: Diagnosis not present

## 2019-06-06 DIAGNOSIS — Z510 Encounter for antineoplastic radiation therapy: Secondary | ICD-10-CM | POA: Diagnosis not present

## 2019-06-09 ENCOUNTER — Ambulatory Visit
Admission: RE | Admit: 2019-06-09 | Discharge: 2019-06-09 | Disposition: A | Discharge status: Home / Self Care | Payer: Medicare HMO | Source: Ambulatory Visit | Attending: Radiation Oncology | Admitting: Radiation Oncology

## 2019-06-09 ENCOUNTER — Other Ambulatory Visit: Payer: Self-pay

## 2019-06-09 DIAGNOSIS — C61 Malignant neoplasm of prostate: Secondary | ICD-10-CM | POA: Diagnosis not present

## 2019-06-09 DIAGNOSIS — Z510 Encounter for antineoplastic radiation therapy: Secondary | ICD-10-CM | POA: Diagnosis not present

## 2019-06-10 ENCOUNTER — Ambulatory Visit
Admission: RE | Admit: 2019-06-10 | Discharge: 2019-06-10 | Disposition: A | Discharge status: Home / Self Care | Payer: Medicare HMO | Source: Ambulatory Visit | Attending: Radiation Oncology | Admitting: Radiation Oncology

## 2019-06-10 ENCOUNTER — Other Ambulatory Visit: Payer: Self-pay

## 2019-06-10 DIAGNOSIS — C61 Malignant neoplasm of prostate: Secondary | ICD-10-CM | POA: Diagnosis not present

## 2019-06-10 DIAGNOSIS — Z510 Encounter for antineoplastic radiation therapy: Secondary | ICD-10-CM | POA: Diagnosis not present

## 2019-07-14 ENCOUNTER — Other Ambulatory Visit: Payer: Self-pay | Admitting: *Deleted

## 2019-07-14 ENCOUNTER — Encounter: Payer: Self-pay | Admitting: Radiation Oncology

## 2019-07-14 ENCOUNTER — Other Ambulatory Visit: Payer: Self-pay

## 2019-07-14 ENCOUNTER — Ambulatory Visit
Admission: RE | Admit: 2019-07-14 | Discharge: 2019-07-14 | Disposition: A | Discharge status: Home / Self Care | Payer: Medicare HMO | Source: Ambulatory Visit | Attending: Radiation Oncology | Admitting: Radiation Oncology

## 2019-07-14 VITALS — BP 164/99 | HR 86 | Temp 96.8°F | Resp 18 | Wt 164.1 lb

## 2019-07-14 DIAGNOSIS — Z1283 Encounter for screening for malignant neoplasm of skin: Secondary | ICD-10-CM | POA: Diagnosis not present

## 2019-07-14 DIAGNOSIS — L814 Other melanin hyperpigmentation: Secondary | ICD-10-CM | POA: Diagnosis not present

## 2019-07-14 DIAGNOSIS — C61 Malignant neoplasm of prostate: Secondary | ICD-10-CM

## 2019-07-14 DIAGNOSIS — R351 Nocturia: Secondary | ICD-10-CM | POA: Insufficient documentation

## 2019-07-14 DIAGNOSIS — D225 Melanocytic nevi of trunk: Secondary | ICD-10-CM | POA: Diagnosis not present

## 2019-07-14 DIAGNOSIS — R197 Diarrhea, unspecified: Secondary | ICD-10-CM | POA: Diagnosis not present

## 2019-07-14 DIAGNOSIS — L821 Other seborrheic keratosis: Secondary | ICD-10-CM | POA: Diagnosis not present

## 2019-07-14 DIAGNOSIS — C44622 Squamous cell carcinoma of skin of right upper limb, including shoulder: Secondary | ICD-10-CM | POA: Diagnosis not present

## 2019-07-14 DIAGNOSIS — L820 Inflamed seborrheic keratosis: Secondary | ICD-10-CM | POA: Diagnosis not present

## 2019-07-14 DIAGNOSIS — Z923 Personal history of irradiation: Secondary | ICD-10-CM | POA: Insufficient documentation

## 2019-07-14 DIAGNOSIS — D2230 Melanocytic nevi of unspecified part of face: Secondary | ICD-10-CM | POA: Diagnosis not present

## 2019-07-14 DIAGNOSIS — D1800 Hemangioma unspecified site: Secondary | ICD-10-CM | POA: Diagnosis not present

## 2019-07-14 DIAGNOSIS — L578 Other skin changes due to chronic exposure to nonionizing radiation: Secondary | ICD-10-CM | POA: Diagnosis not present

## 2019-07-14 NOTE — Progress Notes (Signed)
Radiation Oncology Follow up Note  Name: Andrew Walsh   Date:   07/14/2019 MRN:  [id number] DOB: 1941/03/09    This 79 y.o. male presents to the clinic today for 1 month follow-up status post IMRT radiation therapy for a stage IIb.  Adenocarcinoma the prostate Gleason score 7 (4+3) presenting with a PSA of 9.4  REFERRING PROVIDER: Adin Hector, MD  HPI: Patient is a 78 year old male now at 1 month having completed IMRT radiation therapy for stage IIb (T2 cN0 M0) Gleason 7 (4+3) adenocarcinoma the prostate presenting with a PSA of 9.4.Marland Kitchen  We treated both his prostate and pelvic lymph nodes with external beam IMRT treatment.  Seen today 1 month out he is doing well.  Still has some slight intermittent diarrhea really according to patient not associated with certain meals.  She does not take Imodium regularly.  He also has some slight frequency of urination and nocturia x2.  COMPLICATIONS OF TREATMENT: none  FOLLOW UP COMPLIANCE: keeps appointments   PHYSICAL EXAM:  BP (!) 164/99 (BP Location: Left Arm, Patient Position: Sitting)   Pulse 86   Temp (!) 96.8 F (36 C) (Tympanic)   Resp 18   Wt 164 lb 1.6 oz (74.4 kg)   BMI 24.95 kg/m  Well-developed well-nourished patient in NAD. HEENT reveals PERLA, EOMI, discs not visualized.  Oral cavity is clear. No oral mucosal lesions are identified. Neck is clear without evidence of cervical or supraclavicular adenopathy. Lungs are clear to A&P. Cardiac examination is essentially unremarkable with regular rate and rhythm without murmur rub or thrill. Abdomen is benign with no organomegaly or masses noted. Motor sensory and DTR levels are equal and symmetric in the upper and lower extremities. Cranial nerves II through XII are grossly intact. Proprioception is intact. No peripheral adenopathy or edema is identified. No motor or sensory levels are noted. Crude visual fields are within normal range.  RADIOLOGY RESULTS: No current films for  review  PLAN: Present time patient is doing well 1 month out with very minor side effect profile.  I am pleased with his overall progress.  I have asked to see him back in 3 months for follow-up with a PSA prior to that visit.  Patient knows to call sooner with any concerns.  He is going to try some low residue diet for his diarrhea.  Patient is to call with any concerns.  I would like to take this opportunity to thank you for allowing me to participate in the care of your patient.Noreene Filbert, MD

## 2019-07-18 DIAGNOSIS — R972 Elevated prostate specific antigen [PSA]: Secondary | ICD-10-CM | POA: Diagnosis not present

## 2019-07-18 DIAGNOSIS — E7849 Other hyperlipidemia: Secondary | ICD-10-CM | POA: Diagnosis not present

## 2019-07-25 DIAGNOSIS — R739 Hyperglycemia, unspecified: Secondary | ICD-10-CM | POA: Diagnosis not present

## 2019-07-25 DIAGNOSIS — M1990 Unspecified osteoarthritis, unspecified site: Secondary | ICD-10-CM | POA: Diagnosis not present

## 2019-07-25 DIAGNOSIS — C61 Malignant neoplasm of prostate: Secondary | ICD-10-CM | POA: Diagnosis not present

## 2019-07-25 DIAGNOSIS — Z87891 Personal history of nicotine dependence: Secondary | ICD-10-CM | POA: Diagnosis not present

## 2019-07-25 DIAGNOSIS — E7849 Other hyperlipidemia: Secondary | ICD-10-CM | POA: Diagnosis not present

## 2019-07-25 DIAGNOSIS — Z85828 Personal history of other malignant neoplasm of skin: Secondary | ICD-10-CM | POA: Diagnosis not present

## 2019-07-25 DIAGNOSIS — Z79818 Long term (current) use of other agents affecting estrogen receptors and estrogen levels: Secondary | ICD-10-CM | POA: Diagnosis not present

## 2019-07-25 DIAGNOSIS — R269 Unspecified abnormalities of gait and mobility: Secondary | ICD-10-CM | POA: Diagnosis not present

## 2019-07-30 ENCOUNTER — Telehealth: Payer: Self-pay | Admitting: Urology

## 2019-07-30 NOTE — Telephone Encounter (Signed)
NO PA REQUIRED FOR LUPRON FOR [employer] 07-30-19 MICHELLE

## 2019-08-19 DIAGNOSIS — E7849 Other hyperlipidemia: Secondary | ICD-10-CM | POA: Diagnosis not present

## 2019-08-26 DIAGNOSIS — Z85828 Personal history of other malignant neoplasm of skin: Secondary | ICD-10-CM | POA: Diagnosis not present

## 2019-08-26 DIAGNOSIS — C61 Malignant neoplasm of prostate: Secondary | ICD-10-CM | POA: Diagnosis not present

## 2019-08-26 DIAGNOSIS — R739 Hyperglycemia, unspecified: Secondary | ICD-10-CM | POA: Diagnosis not present

## 2019-08-26 DIAGNOSIS — Z87891 Personal history of nicotine dependence: Secondary | ICD-10-CM | POA: Diagnosis not present

## 2019-08-26 DIAGNOSIS — M19011 Primary osteoarthritis, right shoulder: Secondary | ICD-10-CM | POA: Diagnosis not present

## 2019-08-26 DIAGNOSIS — D649 Anemia, unspecified: Secondary | ICD-10-CM | POA: Diagnosis not present

## 2019-08-26 DIAGNOSIS — E7849 Other hyperlipidemia: Secondary | ICD-10-CM | POA: Diagnosis not present

## 2019-08-29 DIAGNOSIS — M25511 Pain in right shoulder: Secondary | ICD-10-CM | POA: Diagnosis not present

## 2019-08-29 DIAGNOSIS — G8929 Other chronic pain: Secondary | ICD-10-CM | POA: Diagnosis not present

## 2019-08-29 DIAGNOSIS — M19011 Primary osteoarthritis, right shoulder: Secondary | ICD-10-CM | POA: Diagnosis not present

## 2019-09-09 ENCOUNTER — Other Ambulatory Visit: Payer: Self-pay

## 2019-09-10 ENCOUNTER — Inpatient Hospital Stay: Payer: Medicare HMO | Attending: Oncology | Admitting: Oncology

## 2019-09-10 ENCOUNTER — Encounter (INDEPENDENT_AMBULATORY_CARE_PROVIDER_SITE_OTHER): Payer: Self-pay

## 2019-09-10 ENCOUNTER — Other Ambulatory Visit: Payer: Self-pay

## 2019-09-10 ENCOUNTER — Encounter: Payer: Self-pay | Admitting: Oncology

## 2019-09-10 ENCOUNTER — Inpatient Hospital Stay: Payer: Medicare HMO

## 2019-09-10 DIAGNOSIS — C61 Malignant neoplasm of prostate: Secondary | ICD-10-CM | POA: Diagnosis not present

## 2019-09-10 DIAGNOSIS — Z923 Personal history of irradiation: Secondary | ICD-10-CM | POA: Insufficient documentation

## 2019-09-10 DIAGNOSIS — F1010 Alcohol abuse, uncomplicated: Secondary | ICD-10-CM | POA: Insufficient documentation

## 2019-09-10 DIAGNOSIS — Z7289 Other problems related to lifestyle: Secondary | ICD-10-CM

## 2019-09-10 DIAGNOSIS — R7989 Other specified abnormal findings of blood chemistry: Secondary | ICD-10-CM | POA: Insufficient documentation

## 2019-09-10 DIAGNOSIS — D539 Nutritional anemia, unspecified: Secondary | ICD-10-CM

## 2019-09-10 DIAGNOSIS — Z789 Other specified health status: Secondary | ICD-10-CM

## 2019-09-10 LAB — COMPREHENSIVE METABOLIC PANEL
ALT: 37 U/L (ref 0–44)
AST: 38 U/L (ref 15–41)
Albumin: 4.1 g/dL (ref 3.5–5.0)
Alkaline Phosphatase: 53 U/L (ref 38–126)
Anion gap: 9 (ref 5–15)
BUN: 15 mg/dL (ref 8–23)
CO2: 24 mmol/L (ref 22–32)
Calcium: 9.1 mg/dL (ref 8.9–10.3)
Chloride: 104 mmol/L (ref 98–111)
Creatinine, Ser: 0.79 mg/dL (ref 0.61–1.24)
GFR calc Af Amer: >60 mL/min (ref >60)
GFR calc non Af Amer: >60 mL/min (ref >60)
Glucose, Bld: 130 mg/dL — ABNORMAL HIGH (ref 70–99)
Potassium: 4.2 mmol/L (ref 3.5–5.1)
Sodium: 137 mmol/L (ref 135–145)
Total Bilirubin: 0.8 mg/dL (ref 0.3–1.2)
Total Protein: 6.9 g/dL (ref 6.5–8.1)

## 2019-09-10 LAB — CBC WITH DIFFERENTIAL/PLATELET
Abs Immature Granulocytes: 0.02 10*3/uL (ref 0.00–0.07)
Basophils Absolute: 0 10*3/uL (ref 0.0–0.1)
Basophils Relative: 1 %
Eosinophils Absolute: 0.2 10*3/uL (ref 0.0–0.5)
Eosinophils Relative: 6 %
HCT: 39.1 % (ref 39.0–52.0)
Hemoglobin: 12.9 g/dL — ABNORMAL LOW (ref 13.0–17.0)
Immature Granulocytes: 1 %
Lymphocytes Relative: 19 %
Lymphs Abs: 0.7 10*3/uL (ref 0.7–4.0)
MCH: 35.1 pg — ABNORMAL HIGH (ref 26.0–34.0)
MCHC: 33 g/dL (ref 30.0–36.0)
MCV: 106.5 fL — ABNORMAL HIGH (ref 80.0–100.0)
Monocytes Absolute: 0.4 10*3/uL (ref 0.1–1.0)
Monocytes Relative: 9 %
Neutro Abs: 2.6 10*3/uL (ref 1.7–7.7)
Neutrophils Relative %: 64 %
Platelets: 186 10*3/uL (ref 150–400)
RBC: 3.67 MIL/uL — ABNORMAL LOW (ref 4.22–5.81)
RDW: 12.6 % (ref 11.5–15.5)
WBC: 4 10*3/uL (ref 4.0–10.5)
nRBC: 0 % (ref 0.0–0.2)

## 2019-09-10 LAB — FERRITIN: Ferritin: 515 ng/mL — ABNORMAL HIGH (ref 24–336)

## 2019-09-10 LAB — IRON AND TIBC
Iron: 169 ug/dL (ref 45–182)
Saturation Ratios: 61 % — ABNORMAL HIGH (ref 17.9–39.5)
TIBC: 275 ug/dL (ref 250–450)
UIBC: 106 ug/dL

## 2019-09-10 LAB — PSA: Prostatic Specific Antigen: 0.01 ng/mL (ref 0.00–4.00)

## 2019-09-10 NOTE — Progress Notes (Signed)
Hematology/Oncology Consult note [facility] Telephone:([phone] [phone]   Patient Care Team: Adin Hector, MD as PCP - General (Internal Medicine)  REFERRING PROVIDER: Adin Hector, MD  CHIEF COMPLAINTS/REASON FOR VISIT:  Establish care for hemochromatosis management  HISTORY OF PRESENTING ILLNESS:  Andrew Walsh is a 78 y.o. male who was seen in consultation at the request of Adin Hector, MD for evaluation of elevated ferritin level/hemochromatosis management. Patient reports a history  hereditary hemochromatosis.  He was diagnosed in 109s by his previous primary care provider in [place] and he has been an active blood donor every 8 to 10 weeks for many years.  Recently he was diagnosed with prostate cancer Gleason score 4+3 with a PSA of 9.4 and underwent IMRT radiation by Dr. Baruch Gouty and he finished in September 2020. He is mildly anemic and also cannot further donate blood due to recent radiation and was referred to me for management of hemochromatosis and iron overload.   Context Shortness of breath: Patient reports mild shortness of breath after exertion recently and he attributes to Covid pandemic and not able to be physically active lately. Fatigue: denies He had blood work done at [facility] primary care provider's office recently. 08/19/2019, hemoglobin 14, hematocrit 40.7, MCV 105.7. Iron 254, ferritin 514,  07/18/2019, iron 259, ferritin 476, transferrin 193.5, TIBC 270, iron saturation 96  Review of Systems  Constitutional: Negative for appetite change, chills, fatigue, fever and unexpected weight change.  HENT:   Negative for hearing loss and voice change.   Eyes: Negative for eye problems and icterus.  Respiratory: Negative for chest tightness, cough and shortness of breath.   Cardiovascular: Negative for chest pain and leg swelling.  Gastrointestinal: Negative for abdominal distention and abdominal  pain.  Endocrine: Negative for hot flashes.  Genitourinary: Negative for difficulty urinating, dysuria and frequency.   Musculoskeletal: Negative for arthralgias.  Skin: Negative for itching and rash.  Neurological: Negative for light-headedness and numbness.  Hematological: Negative for adenopathy. Does not bruise/bleed easily.  Psychiatric/Behavioral: Negative for confusion.    MEDICAL HISTORY:  Past Medical History:  Diagnosis Date  . Ankle arthropathy 02/24/2014  . Arthritis of foot, degenerative 10/03/2012   Overview:  Subtalar arthritis   . Cancer ([employer])   . Hereditary hemochromatosis ([place]) 11/03/2015   Overview:  Treated by regular blood donation   . History of kidney stones   . HLD (hyperlipidemia) 11/03/2015  . Localized, primary osteoarthritis of ankle or foot 02/24/2014    SURGICAL HISTORY: Past Surgical History:  Procedure Laterality Date  . ANKLE FUSION Right 2001, 2016  . REVISION TOTAL HIP ARTHROPLASTY  2006   Right 2011, Left 2006    SOCIAL HISTORY: Social History   Socioeconomic History  . Marital status: Married    Spouse name: Not on file  . Number of children: Not on file  . Years of education: Not on file  . Highest education level: Not on file  Occupational History  . Not on file  Tobacco Use  . Smoking status: Former Smoker    Years: 20.00    Types: Cigarettes    Quit date: 09/10/1979    Years since quitting: 40.0  . Smokeless tobacco: Never Used  Substance and Sexual Activity  . Alcohol use: Yes    Alcohol/week: 0.0 standard drinks  . Drug use: No  . Sexual activity: Not on file  Other Topics Concern  . Not on file  Social  History Narrative  . Not on file   Social Determinants of Health   Financial Resource Strain:   . Difficulty of Paying Living Expenses: Not on file  Food Insecurity:   . Worried About Charity fundraiser in the Last Year: Not on file  . Ran Out of Food in the Last Year: Not on file  Transportation Needs:   . Lack  of Transportation (Medical): Not on file  . Lack of Transportation (Non-Medical): Not on file  Physical Activity:   . Days of Exercise per Week: Not on file  . Minutes of Exercise per Session: Not on file  Stress:   . Feeling of Stress : Not on file  Social Connections:   . Frequency of Communication with Friends and Family: Not on file  . Frequency of Social Gatherings with Friends and Family: Not on file  . Attends Religious Services: Not on file  . Active Member of Clubs or Organizations: Not on file  . Attends Archivist Meetings: Not on file  . Marital Status: Not on file  Intimate Partner Violence:   . Fear of Current or Ex-Partner: Not on file  . Emotionally Abused: Not on file  . Physically Abused: Not on file  . Sexually Abused: Not on file    FAMILY HISTORY: Family History  Problem Relation Age of Onset  . Bladder Cancer Neg Hx   . Prostate cancer Neg Hx   . Kidney cancer Neg Hx     ALLERGIES:  has No Known Allergies.  MEDICATIONS:  Current Outpatient Medications  Medication Sig Dispense Refill  . Calcium Carbonate-Vitamin D (CALCIUM-VITAMIN D3 PO) Take by mouth.    . Glucosamine 500 MG CAPS Take by mouth.    . Multiple Vitamin (MULTI-VITAMINS) TABS Take by mouth.    . simvastatin (ZOCOR) 20 MG tablet      No current facility-administered medications for this visit.     PHYSICAL EXAMINATION: ECOG PERFORMANCE STATUS: 1 - Symptomatic but completely ambulatory Vitals:   09/10/19 1102 09/10/19 1107  BP: (!) 184/112 (!) 167/97  Pulse: 80   Resp: 18   Temp: (!) 97.3 F (36.3 C)   SpO2: 100%    Filed Weights   09/10/19 1102  Weight: 171 lb 6.4 oz (77.7 kg)    Physical Exam Constitutional:      General: He is not in acute distress. HENT:     Head: Normocephalic and atraumatic.  Eyes:     General: No scleral icterus.    Pupils: Pupils are equal, round, and reactive to light.  Cardiovascular:     Rate and Rhythm: Normal rate and regular  rhythm.     Heart sounds: Normal heart sounds.  Pulmonary:     Effort: Pulmonary effort is normal.  Abdominal:     General: Bowel sounds are normal. There is no distension.     Palpations: Abdomen is soft. There is no mass.     Tenderness: There is no abdominal tenderness.  Musculoskeletal:        General: No swelling or deformity. Normal range of motion.     Cervical back: Normal range of motion and neck supple.  Skin:    General: Skin is warm and dry.     Findings: No erythema or rash.  Neurological:     Mental Status: He is alert and oriented to person, place, and time.     Cranial Nerves: No cranial nerve deficit.     Coordination: Coordination normal.  Psychiatric:        Mood and Affect: Mood normal.      LABORATORY DATA:  I have reviewed the data as listed Lab Results  Component Value Date   WBC 4.0 09/10/2019   HGB 12.9 (L) 09/10/2019   HCT 39.1 09/10/2019   MCV 106.5 (H) 09/10/2019   PLT 186 09/10/2019   Recent Labs    02/06/19 1407 04/02/19 0933 09/10/19 1148  NA  --   --  137  K  --   --  4.2  CL  --   --  104  CO2  --   --  24  GLUCOSE  --   --  130*  BUN  --   --  15  CREATININE 0.90 0.90 0.79  CALCIUM  --   --  9.1  GFRNONAA  --   --  >60  GFRAA  --   --  >60  PROT  --   --  6.9  ALBUMIN  --   --  4.1  AST  --   --  38  ALT  --   --  37  ALKPHOS  --   --  53  BILITOT  --   --  0.8   Iron/TIBC/Ferritin/ %Sat    Component Value Date/Time   IRON 169 09/10/2019 1148   TIBC 275 09/10/2019 1148   FERRITIN 515 (H) 09/10/2019 1148   IRONPCTSAT 61 (H) 09/10/2019 1148      RADIOGRAPHIC STUDIES: I have personally reviewed the radiological images as listed and agreed with the findings in the report.  No results found.   ASSESSMENT & PLAN:  1. Hereditary hemochromatosis ([place])   2. Elevated ferritin   3. Alcohol use   4. Macrocytic anemia    #Previous labs were reviewed and discussed with patient. History of hereditary hemochromatosis.  Since we do not have confirmatory testing in our system I recommend patient to have screening testing for hemochromatosis gene mutations. He agrees with the plan. Repeat CBC, iron, TIBC ferritin. I will arrange phlebotomy pending on hemochromatosis gene mutation profile. LFT is normal Patient had CT abdomen pelvis on 04/02/2019. Images were independently reviewed by me.  #Anemia, macrocytic with an MCV of 106.5. Reviewing his previous labs, macrocytosis seems to been a chronic issue for him and progressively worsening. Given the history of alcohol use, will need to rule out vitamin B12 and folate deficiencies. Will check at the next visit.  #Chronic alcohol use, moderate amount. Recommend patient to consider decrease consumption of alcohol. Alcohol and iron overload may have cumulative causing liver damage. Alcohol use also increase ferritin and iron saturation and further obscure his picture of iron overload.  Further management pending above work-up.  Orders Placed This Encounter  Procedures  . Hemochromatosis DNA-PCR(c282y,h63d)    Standing Status:   Future    Number of Occurrences:   1    Standing Expiration Date:   09/09/2020  . CBC with Differential    Standing Status:   Future    Number of Occurrences:   1    Standing Expiration Date:   09/09/2020  . Comprehensive metabolic panel    Standing Status:   Future    Number of Occurrences:   1    Standing Expiration Date:   09/09/2020  . Ferritin    Standing Status:   Future    Number of Occurrences:   1    Standing Expiration Date:   09/09/2020  . Iron and  TIBC    Standing Status:   Future    Number of Occurrences:   1    Standing Expiration Date:   09/09/2020    All questions were answered. The patient knows to call the clinic with any problems questions or concerns.  Cc Adin Hector, MD  Return of visit: 2 weeks Thank you for this kind referral and the opportunity to participate in the care of this patient. A copy  of today's note is routed to referring provider  Total face to face encounter time for this patient visit was 45 min. >50% of the time was  spent in counseling and coordination of care.    Earlie Server, MD, PhD Hematology Oncology [facility] at [facility] Pager- [phone] 09/10/2019

## 2019-09-10 NOTE — Progress Notes (Signed)
New patient for hematology evaluation. 

## 2019-09-15 LAB — HEMOCHROMATOSIS DNA-PCR(C282Y,H63D)

## 2019-09-25 ENCOUNTER — Encounter: Payer: Self-pay | Admitting: Oncology

## 2019-09-25 NOTE — Progress Notes (Signed)
Pt prescreened for follow up appt on 1/15. Pt reports having diarrhea incontinence since last radiation tx.

## 2019-09-26 ENCOUNTER — Encounter: Payer: Self-pay | Admitting: Oncology

## 2019-09-26 ENCOUNTER — Other Ambulatory Visit: Payer: Self-pay

## 2019-09-26 ENCOUNTER — Inpatient Hospital Stay: Payer: Medicare HMO | Attending: Oncology | Admitting: Oncology

## 2019-09-26 ENCOUNTER — Inpatient Hospital Stay: Payer: Medicare HMO

## 2019-09-26 DIAGNOSIS — Z789 Other specified health status: Secondary | ICD-10-CM

## 2019-09-26 DIAGNOSIS — Z79899 Other long term (current) drug therapy: Secondary | ICD-10-CM | POA: Insufficient documentation

## 2019-09-26 DIAGNOSIS — R7989 Other specified abnormal findings of blood chemistry: Secondary | ICD-10-CM

## 2019-09-26 DIAGNOSIS — C61 Malignant neoplasm of prostate: Secondary | ICD-10-CM | POA: Insufficient documentation

## 2019-09-26 DIAGNOSIS — R0602 Shortness of breath: Secondary | ICD-10-CM | POA: Insufficient documentation

## 2019-09-26 DIAGNOSIS — Z7289 Other problems related to lifestyle: Secondary | ICD-10-CM | POA: Diagnosis not present

## 2019-09-26 DIAGNOSIS — Z923 Personal history of irradiation: Secondary | ICD-10-CM | POA: Insufficient documentation

## 2019-09-26 DIAGNOSIS — D539 Nutritional anemia, unspecified: Secondary | ICD-10-CM | POA: Diagnosis not present

## 2019-09-26 NOTE — Progress Notes (Signed)
Hematology/Oncology note [facility] [phone] [phone]   Patient Care Team: Adin Hector, MD as PCP - General (Internal Medicine)  REFERRING PROVIDER: Adin Hector, MD  CHIEF COMPLAINTS/REASON FOR VISIT:  Establish care for hemochromatosis management  HISTORY OF PRESENTING ILLNESS:  Andrew Walsh is a 79 y.o. male who was seen in consultation at the request of Adin Hector, MD for evaluation of elevated ferritin level/hemochromatosis management. Patient reports a history  hereditary hemochromatosis.  He was diagnosed in 68s by his previous primary care provider in [place] and he has been an active blood donor every 8 to 10 weeks for many years.  Recently he was diagnosed with prostate cancer Gleason score 4+3 with a PSA of 9.4 and underwent IMRT radiation by Dr. Baruch Gouty and he finished in September 2020. He is mildly anemic and also cannot further donate blood due to recent radiation and was referred to me for management of hemochromatosis and iron overload.   Context Shortness of breath: Patient reports mild shortness of breath after exertion recently and he attributes to Covid pandemic and not able to be physically active lately. Fatigue: denies He had blood work done at [facility] primary care provider's office recently. 08/19/2019, hemoglobin 14, hematocrit 40.7, MCV 105.7. Iron 254, ferritin 514,  07/18/2019, iron 259, ferritin 476, transferrin 193.5, TIBC 270, iron saturation 96  INTERVAL HISTORY Andrew Walsh is a 79 y.o. male who has above history reviewed by me today presents for follow up visit for management of iron overload/hemochromatosis. Problems and complaints are listed below: During interval, patient has had blood work done.  He has no new complaints.  He presented to discuss about lab results and management plan.  Review of Systems  Constitutional: Negative for appetite change, chills,  fatigue, fever and unexpected weight change.  HENT:   Negative for hearing loss and voice change.   Eyes: Negative for eye problems and icterus.  Respiratory: Negative for chest tightness, cough and shortness of breath.   Cardiovascular: Negative for chest pain and leg swelling.  Gastrointestinal: Negative for abdominal distention and abdominal pain.  Endocrine: Negative for hot flashes.  Genitourinary: Negative for difficulty urinating, dysuria and frequency.   Musculoskeletal: Negative for arthralgias.  Skin: Negative for itching and rash.  Neurological: Negative for light-headedness and numbness.  Hematological: Negative for adenopathy. Does not bruise/bleed easily.  Psychiatric/Behavioral: Negative for confusion.    MEDICAL HISTORY:  Past Medical History:  Diagnosis Date  . Ankle arthropathy 02/24/2014  . Arthritis of foot, degenerative 10/03/2012   Overview:  Subtalar arthritis   . Cancer ([place])   . Hereditary hemochromatosis ([place]) 11/03/2015   Overview:  Treated by regular blood donation   . History of kidney stones   . HLD (hyperlipidemia) 11/03/2015  . Localized, primary osteoarthritis of ankle or foot 02/24/2014    SURGICAL HISTORY: Past Surgical History:  Procedure Laterality Date  . ANKLE FUSION Right 2001, 2016  . REVISION TOTAL HIP ARTHROPLASTY  2006   Right 2011, Left 2006    SOCIAL HISTORY: Social History   Socioeconomic History  . Marital status: Married    Spouse name: Not on file  . Number of children: Not on file  . Years of education: Not on file  . Highest education level: Not on file  Occupational History  . Not on file  Tobacco Use  . Smoking status: Former Smoker    Years: 20.00    Types: Cigarettes  Quit date: 09/10/1979    Years since quitting: 40.0  . Smokeless tobacco: Never Used  Substance and Sexual Activity  . Alcohol use: Yes    Alcohol/week: 0.0 standard drinks  . Drug use: No  . Sexual activity: Not on file  Other Topics  Concern  . Not on file  Social History Narrative  . Not on file   Social Determinants of Health   Financial Resource Strain:   . Difficulty of Paying Living Expenses: Not on file  Food Insecurity:   . Worried About Charity fundraiser in the Last Year: Not on file  . Ran Out of Food in the Last Year: Not on file  Transportation Needs:   . Lack of Transportation (Medical): Not on file  . Lack of Transportation (Non-Medical): Not on file  Physical Activity:   . Days of Exercise per Week: Not on file  . Minutes of Exercise per Session: Not on file  Stress:   . Feeling of Stress : Not on file  Social Connections:   . Frequency of Communication with Friends and Family: Not on file  . Frequency of Social Gatherings with Friends and Family: Not on file  . Attends Religious Services: Not on file  . Active Member of Clubs or Organizations: Not on file  . Attends Archivist Meetings: Not on file  . Marital Status: Not on file  Intimate Partner Violence:   . Fear of Current or Ex-Partner: Not on file  . Emotionally Abused: Not on file  . Physically Abused: Not on file  . Sexually Abused: Not on file    FAMILY HISTORY: Family History  Problem Relation Age of Onset  . Bladder Cancer Neg Hx   . Prostate cancer Neg Hx   . Kidney cancer Neg Hx     ALLERGIES:  has No Known Allergies.  MEDICATIONS:  Current Outpatient Medications  Medication Sig Dispense Refill  . Calcium Carbonate-Vitamin D (CALCIUM-VITAMIN D3 PO) Take by mouth.    . Glucosamine 500 MG CAPS Take by mouth.    . Multiple Vitamin (MULTI-VITAMINS) TABS Take by mouth.    . simvastatin (ZOCOR) 20 MG tablet      No current facility-administered medications for this visit.     PHYSICAL EXAMINATION: ECOG PERFORMANCE STATUS: 1 - Symptomatic but completely ambulatory Vitals:   09/26/19 1459  BP: (!) 145/88  Pulse: (!) 55  Resp: 18  Temp: (!) 96.5 F (35.8 C)   Filed Weights   09/26/19 1459  Weight:  170 lb 1.6 oz (77.2 kg)    Physical Exam Constitutional:      General: He is not in acute distress. HENT:     Head: Normocephalic and atraumatic.  Eyes:     General: No scleral icterus.    Pupils: Pupils are equal, round, and reactive to light.  Cardiovascular:     Rate and Rhythm: Normal rate and regular rhythm.     Heart sounds: Normal heart sounds.  Pulmonary:     Effort: Pulmonary effort is normal. No respiratory distress.     Breath sounds: No wheezing.  Abdominal:     General: Bowel sounds are normal. There is no distension.     Palpations: Abdomen is soft. There is no mass.     Tenderness: There is no abdominal tenderness.  Musculoskeletal:        General: No swelling or deformity. Normal range of motion.     Cervical back: Normal range of motion and  neck supple.  Skin:    General: Skin is warm and dry.     Findings: No erythema or rash.  Neurological:     Mental Status: He is alert and oriented to person, place, and time.     Cranial Nerves: No cranial nerve deficit.     Coordination: Coordination normal.  Psychiatric:        Mood and Affect: Mood normal.        Behavior: Behavior normal.        Thought Content: Thought content normal.      LABORATORY DATA:  I have reviewed the data as listed Lab Results  Component Value Date   WBC 4.0 09/10/2019   HGB 12.9 (L) 09/10/2019   HCT 39.1 09/10/2019   MCV 106.5 (H) 09/10/2019   PLT 186 09/10/2019   Recent Labs    02/06/19 1407 04/02/19 0933 09/10/19 1148  NA  --   --  137  K  --   --  4.2  CL  --   --  104  CO2  --   --  24  GLUCOSE  --   --  130*  BUN  --   --  15  CREATININE 0.90 0.90 0.79  CALCIUM  --   --  9.1  GFRNONAA  --   --  >60  GFRAA  --   --  >60  PROT  --   --  6.9  ALBUMIN  --   --  4.1  AST  --   --  38  ALT  --   --  37  ALKPHOS  --   --  53  BILITOT  --   --  0.8   Iron/TIBC/Ferritin/ %Sat    Component Value Date/Time   IRON 169 09/10/2019 1148   TIBC 275 09/10/2019 1148    FERRITIN 515 (H) 09/10/2019 1148   IRONPCTSAT 61 (H) 09/10/2019 1148      RADIOGRAPHIC STUDIES: I have personally reviewed the radiological images as listed and agreed with the findings in the report.  No results found.   ASSESSMENT & PLAN:  1. Hereditary hemochromatosis ([place])   2. Elevated ferritin   3. Macrocytic anemia   4. Alcohol use    #Previous labs were reviewed and discussed with patient. History of hereditary hemochromatosis.  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 Iron labs were reviewed with patient. Ferritin level 515, iron saturation 61.  Consistent with iron overload. I recommend phlebotomy 500 cc x 1 today.  Patient has mild anemia with a hemoglobin of 12.9 likely due to recent radiation. I recommend phlebotomy 500 cc every 2 weeks x 3 H&H will be obtained prior to the phlebotomy and phlebotomy will be held if hemoglobin is less than 11.5.  #Microcytic anemia, I will check vitamin B12 and folate level. #Chronic alcohol use, moderate amount.  Alcohol cessation   Orders Placed This Encounter  Procedures  . Hematocrit ([facility])    Standing Status:   Standing    Number of Occurrences:   3    Standing Expiration Date:   09/25/2020  . Hemoglobin [facility])    Standing Status:   Standing    Number of Occurrences:   3    Standing Expiration Date:   09/25/2020  . Hematocrit ([facility])    Standing Status:   Future    Standing Expiration Date:   09/25/2020  . Hemoglobin [facility])    Standing Status:   Future  Standing Expiration Date:   09/25/2020    All questions were answered. The patient knows to call the clinic with any problems questions or concerns. Return of visit:  10 weeks  Earlie Server, MD, PhD Hematology Oncology [facility] Pager- [phone] 09/26/2019

## 2019-09-26 NOTE — Progress Notes (Signed)
Pt tolerated phlebotomy well with no signs of complications. Pt was observed post phlebotomy with no complications. Pt stable for discharge. VSS  Andrew Walsh [employer]

## 2019-09-29 DIAGNOSIS — L578 Other skin changes due to chronic exposure to nonionizing radiation: Secondary | ICD-10-CM | POA: Diagnosis not present

## 2019-09-29 DIAGNOSIS — D0439 Carcinoma in situ of skin of other parts of face: Secondary | ICD-10-CM | POA: Diagnosis not present

## 2019-09-29 DIAGNOSIS — L814 Other melanin hyperpigmentation: Secondary | ICD-10-CM | POA: Diagnosis not present

## 2019-09-29 DIAGNOSIS — Z1283 Encounter for screening for malignant neoplasm of skin: Secondary | ICD-10-CM | POA: Diagnosis not present

## 2019-09-29 DIAGNOSIS — D229 Melanocytic nevi, unspecified: Secondary | ICD-10-CM | POA: Diagnosis not present

## 2019-09-29 DIAGNOSIS — L570 Actinic keratosis: Secondary | ICD-10-CM | POA: Diagnosis not present

## 2019-09-29 DIAGNOSIS — D225 Melanocytic nevi of trunk: Secondary | ICD-10-CM | POA: Diagnosis not present

## 2019-09-29 DIAGNOSIS — D2230 Melanocytic nevi of unspecified part of face: Secondary | ICD-10-CM | POA: Diagnosis not present

## 2019-09-29 DIAGNOSIS — L820 Inflamed seborrheic keratosis: Secondary | ICD-10-CM | POA: Diagnosis not present

## 2019-10-01 ENCOUNTER — Ambulatory Visit: Payer: Medicare HMO | Attending: Internal Medicine

## 2019-10-01 DIAGNOSIS — Z23 Encounter for immunization: Secondary | ICD-10-CM | POA: Insufficient documentation

## 2019-10-01 NOTE — Progress Notes (Signed)
   Covid-19 Vaccination Clinic  Name:  Andrew Walsh    MRN: [id number] DOB: 1940-12-29  10/01/2019  Mr. Hiegel was observed post Covid-19 immunization for 15 minutes without incidence. He was provided with Vaccine Information Sheet and instruction to access the V-Safe system.   Mr. Cerino was instructed to call 911 with any severe reactions post vaccine: Marland Kitchen Difficulty breathing  . Swelling of your face and throat  . A fast heartbeat  . A bad rash all over your body  . Dizziness and weakness    Immunizations Administered    Name Date Dose VIS Date Route   Pfizer COVID-19 Vaccine 10/01/2019 11:23 AM 0.3 mL 08/22/2019 Intramuscular   Manufacturer: [place]   Lot: [id number]   [place]: [id number]

## 2019-10-07 ENCOUNTER — Ambulatory Visit: Payer: Medicare HMO | Admitting: Urology

## 2019-10-07 ENCOUNTER — Encounter: Payer: Self-pay | Admitting: Urology

## 2019-10-07 ENCOUNTER — Other Ambulatory Visit: Payer: Self-pay

## 2019-10-07 VITALS — BP 173/93 | HR 88 | Ht 68.0 in | Wt 170.0 lb

## 2019-10-07 DIAGNOSIS — R159 Full incontinence of feces: Secondary | ICD-10-CM

## 2019-10-07 DIAGNOSIS — C61 Malignant neoplasm of prostate: Secondary | ICD-10-CM | POA: Diagnosis not present

## 2019-10-07 DIAGNOSIS — R350 Frequency of micturition: Secondary | ICD-10-CM | POA: Diagnosis not present

## 2019-10-07 NOTE — Progress Notes (Signed)
10/07/2019 7:16 PM   Andrew Walsh 10-14-1940 [id number]  Referring provider: Adin Hector, MD [place] [facility] [place],  [place] [address]  Chief Complaint  Patient presents with  . Prostate Cancer    HPI: 79 year old male with a personal history of unfavorable intermediate risk prostate cancer status post fusion biopsy 02/2019 now status post EBRT with Lupron x6 months.  He returns today for routine follow-up following these treatments.  He tolerated Lupron fairly well.  No issues with hot flashes currently.  In terms of radiation, this was completed in 05/2019.  He had ongoing issues with rectal pain, loose stool and fecal incontinence since radiation.  This is improving slightly but still fairly severe.  He does not want to go out of his house or engage in any activity due to the severity of this issue.  In terms of urinary symptoms, he has had no significant issues.  He does have some slight increase in frequency but this seems to be improving.  No dysuria or gross hematuria.  He is on no bladder prostate medications.  His most recent PSA in December 2020 was 0.01.   PMH: Past Medical History:  Diagnosis Date  . Ankle arthropathy 02/24/2014  . Arthritis of foot, degenerative 10/03/2012   Overview:  Subtalar arthritis   . Cancer ([place])   . Hereditary hemochromatosis ([place]) 11/03/2015   Overview:  Treated by regular blood donation   . History of kidney stones   . HLD (hyperlipidemia) 11/03/2015  . Localized, primary osteoarthritis of ankle or foot 02/24/2014    Surgical History: Past Surgical History:  Procedure Laterality Date  . ANKLE FUSION Right 2001, 2016  . REVISION TOTAL HIP ARTHROPLASTY  2006   Right 2011, Left 2006    Home Medications:  Allergies as of 10/07/2019   No Known Allergies     Medication List       Accurate as of October 07, 2019  7:16 PM. If you have any questions, ask your nurse or doctor.         CALCIUM-VITAMIN D3 PO Take by mouth.   Glucosamine 500 MG Caps Take by mouth.   Multi-Vitamins Tabs Take by mouth.   simvastatin 20 MG tablet Commonly known as: ZOCOR       Allergies: No Known Allergies  Family History: Family History  Problem Relation Age of Onset  . Bladder Cancer Neg Hx   . Prostate cancer Neg Hx   . Kidney cancer Neg Hx     Social History:  reports that he quit smoking about 40 years ago. His smoking use included cigarettes. He quit after 20.00 years of use. He has never used smokeless tobacco. He reports current alcohol use. He reports that he does not use drugs.  ROS: UROLOGY Frequent Urination?: No Hard to postpone urination?: No Burning/pain with urination?: No Get up at night to urinate?: No Leakage of urine?: No Urine stream starts and stops?: No Trouble starting stream?: No Do you have to strain to urinate?: No Blood in urine?: No Urinary tract infection?: No Sexually transmitted disease?: No Injury to kidneys or bladder?: No Painful intercourse?: No Weak stream?: No Erection problems?: No Penile pain?: No  Gastrointestinal Nausea?: No Vomiting?: No Indigestion/heartburn?: No Diarrhea?: No Constipation?: No  Constitutional Fever: No Night sweats?: No Weight loss?: No Fatigue?: No  Skin Skin rash/lesions?: No Itching?: No  Eyes Blurred vision?: No Double vision?: No  Ears/Nose/Throat Sore throat?: No Sinus problems?: No  Hematologic/Lymphatic Swollen glands?: No Easy bruising?: No  Cardiovascular Leg swelling?: No Chest pain?: No  Respiratory Cough?: No Shortness of breath?: No  Endocrine Excessive thirst?: No  Musculoskeletal Back pain?: No Joint pain?: No  Neurological Headaches?: No Dizziness?: No  Psychologic Depression?: No Anxiety?: No  Physical Exam: BP (!) 173/93   Pulse 88   Ht 5\' 8"  (1.727 m)   Wt 170 lb (77.1 kg)   BMI 25.85 kg/m   Constitutional:  Alert and oriented, No  acute distress. HEENT: [place] AT, moist mucus membranes.  Trachea midline, no masses. Cardiovascular: No clubbing, cyanosis, or edema. Respiratory: Normal respiratory effort, no increased work of breathing. Skin: No rashes, bruises or suspicious lesions. Neurologic: Grossly intact, no focal deficits, moving all 4 extremities. Psychiatric: Normal mood and affect.  Laboratory Data: Lab Results  Component Value Date   WBC 4.0 09/10/2019   HGB 12.9 (L) 09/10/2019   HCT 39.1 09/10/2019   MCV 106.5 (H) 09/10/2019   PLT 186 09/10/2019    Lab Results  Component Value Date   CREATININE 0.79 09/10/2019   PSA as above   Assessment & Plan:    1. Prostate cancer ([place]) Unfavorable intermediate risk prostate cancer diagnosed 02/2019  Status post IMRT with Lupron x6 months  No plans for further ADT at this time  PSA nearly undetectable as of December 2020  We discussed the concept of PSA nadir and PSA suppression with Lupron.  Anticipate the PSA will establish new baseline.  We discussed the definition of biochemical recurrence.  We will plan to follow his PSA on a every 6 month basis.  He is agreeable this plan.  2. Urinary frequency Improving -minimal bother  Walled off on further intervention unless his symptoms fail to return to baseline  3. Incontinence of feces, unspecified fecal incontinence type Significant bother without dramatic improvement severely impacting quality of life  Offered referral to gastroenterology which he gladly accepted - Ambulatory referral to Gastroenterology   Return in about 6 months (around 04/05/2020) for PSA prior.  Hollice Espy, MD  [facility] [address], [place], [place] [address] [phone]

## 2019-10-10 ENCOUNTER — Inpatient Hospital Stay: Payer: Medicare HMO

## 2019-10-10 ENCOUNTER — Other Ambulatory Visit: Payer: Self-pay

## 2019-10-10 DIAGNOSIS — F1090 Alcohol use, unspecified, uncomplicated: Secondary | ICD-10-CM

## 2019-10-10 DIAGNOSIS — Z7289 Other problems related to lifestyle: Secondary | ICD-10-CM

## 2019-10-10 DIAGNOSIS — R7989 Other specified abnormal findings of blood chemistry: Secondary | ICD-10-CM

## 2019-10-10 DIAGNOSIS — Z923 Personal history of irradiation: Secondary | ICD-10-CM | POA: Diagnosis not present

## 2019-10-10 DIAGNOSIS — R0602 Shortness of breath: Secondary | ICD-10-CM | POA: Diagnosis not present

## 2019-10-10 DIAGNOSIS — Z79899 Other long term (current) drug therapy: Secondary | ICD-10-CM | POA: Diagnosis not present

## 2019-10-10 DIAGNOSIS — D539 Nutritional anemia, unspecified: Secondary | ICD-10-CM | POA: Diagnosis not present

## 2019-10-10 DIAGNOSIS — C61 Malignant neoplasm of prostate: Secondary | ICD-10-CM | POA: Diagnosis not present

## 2019-10-10 DIAGNOSIS — Z789 Other specified health status: Secondary | ICD-10-CM

## 2019-10-10 LAB — VITAMIN B12: Vitamin B-12: 632 pg/mL (ref 180–914)

## 2019-10-10 LAB — HEMOGLOBIN AND HEMATOCRIT, BLOOD
HCT: 37.6 % — ABNORMAL LOW (ref 39.0–52.0)
Hemoglobin: 12.8 g/dL — ABNORMAL LOW (ref 13.0–17.0)

## 2019-10-10 LAB — FOLATE: Folate: 6.6 ng/mL (ref >5.9)

## 2019-10-13 ENCOUNTER — Other Ambulatory Visit: Payer: Self-pay

## 2019-10-13 ENCOUNTER — Inpatient Hospital Stay: Payer: Medicare HMO | Attending: Oncology

## 2019-10-13 DIAGNOSIS — C61 Malignant neoplasm of prostate: Secondary | ICD-10-CM

## 2019-10-13 LAB — PSA: Prostatic Specific Antigen: 0.01 ng/mL (ref 0.00–4.00)

## 2019-10-17 ENCOUNTER — Other Ambulatory Visit: Payer: Self-pay

## 2019-10-20 ENCOUNTER — Ambulatory Visit
Admission: RE | Admit: 2019-10-20 | Discharge: 2019-10-20 | Disposition: A | Discharge status: Home / Self Care | Payer: Medicare HMO | Source: Ambulatory Visit | Attending: Radiation Oncology | Admitting: Radiation Oncology

## 2019-10-20 ENCOUNTER — Encounter: Payer: Self-pay | Admitting: Radiation Oncology

## 2019-10-20 ENCOUNTER — Other Ambulatory Visit: Payer: Self-pay

## 2019-10-20 VITALS — BP 176/95 | HR 61 | Temp 96.0°F | Resp 20 | Wt 170.4 lb

## 2019-10-20 DIAGNOSIS — C61 Malignant neoplasm of prostate: Secondary | ICD-10-CM

## 2019-10-20 DIAGNOSIS — Z923 Personal history of irradiation: Secondary | ICD-10-CM | POA: Diagnosis not present

## 2019-10-20 NOTE — Progress Notes (Signed)
Radiation Oncology Follow up Note  Name: Andrew Walsh   Date:   10/20/2019 MRN:  [id number] DOB: 04/01/1941    This 79 y.o. male presents to the clinic today for 14-month follow-up status post IMRT radiation therapy for stage IIb adenocarcinoma the prostate Gleason score of 7 (4+3) presenting with a PSA of 9.4.  REFERRING PROVIDER: Adin Hector, MD  HPI: Patient is a 79 year old male now out 4 months having completed IMRT radiation therapy for stage IIb (T2 cN0 M0) Gleason 7 (4+3) adenocarcinoma prostate presenting with a PSA of 9.4.  We treated both his prostate and pelvic nodes with external beam IMRT treatment.  Seen today in routine follow-up he is doing fairly well does have intermittent approximately once a week fairly significant diarrhea does not follow a low residue diet or take Imodium.  He specifically denies any increased lower urinary tract symptoms.  He is got an appointment with gastroenterology.Marland Kitchen  His most recent PSA is 0.1  COMPLICATIONS OF TREATMENT: none  FOLLOW UP COMPLIANCE: keeps appointments   PHYSICAL EXAM:  BP (!) 176/95 (BP Location: Left Arm, Patient Position: Sitting, Cuff Size: Normal)   Pulse 61   Temp (!) 96 F (35.6 C) (Tympanic)   Resp 20   Wt 170 lb 6.4 oz (77.3 kg)   BMI 25.91 kg/m  Well-developed well-nourished patient in NAD. HEENT reveals PERLA, EOMI, discs not visualized.  Oral cavity is clear. No oral mucosal lesions are identified. Neck is clear without evidence of cervical or supraclavicular adenopathy. Lungs are clear to A&P. Cardiac examination is essentially unremarkable with regular rate and rhythm without murmur rub or thrill. Abdomen is benign with no organomegaly or masses noted. Motor sensory and DTR levels are equal and symmetric in the upper and lower extremities. Cranial nerves II through XII are grossly intact. Proprioception is intact. No peripheral adenopathy or edema is identified. No motor or sensory levels are noted. Crude  visual fields are within normal range.  RADIOLOGY RESULTS: No current films to review  PLAN: Present time patient is under excellent biochemical control of his prostate cancer.  I have suggested and given him a list of to follow for low residue diet but suggested taking some Imodium as needed.  He also see will see gastroenterology in the next several weeks.  I have asked to see him back in 6 months for follow-up with a PSA prior to that visit.  Patient is to call with any concerns.  I would like to take this opportunity to thank you for allowing me to participate in the care of your patient.Noreene Filbert, MD

## 2019-10-22 ENCOUNTER — Ambulatory Visit: Payer: Medicare HMO | Attending: Internal Medicine

## 2019-10-22 DIAGNOSIS — Z23 Encounter for immunization: Secondary | ICD-10-CM | POA: Insufficient documentation

## 2019-10-22 NOTE — Progress Notes (Signed)
   Covid-19 Vaccination Clinic  Name:  Andrew Walsh    MRN: [id number] DOB: [birth year]  10/22/2019  [name] was observed post Covid-19 immunization for 15 minutes without incidence. He was provided with Vaccine Information Sheet and instruction to access the V-Safe system.   [name] was instructed to call 911 with any severe reactions post vaccine: Marland Kitchen Difficulty breathing  . Swelling of your face and throat  . A fast heartbeat  . A bad rash all over your body  . Dizziness and weakness    Immunizations Administered    Name Date Dose VIS Date Route   Pfizer COVID-19 Vaccine 10/22/2019  3:39 PM 0.3 mL 08/22/2019 Intramuscular   Manufacturer: [place]   Lot: [id number]   [place]: [id number]

## 2019-10-23 ENCOUNTER — Other Ambulatory Visit: Payer: Self-pay

## 2019-10-24 ENCOUNTER — Inpatient Hospital Stay: Payer: Medicare HMO

## 2019-10-24 ENCOUNTER — Other Ambulatory Visit: Payer: Self-pay | Admitting: Oncology

## 2019-10-24 ENCOUNTER — Other Ambulatory Visit: Payer: Self-pay

## 2019-10-24 DIAGNOSIS — C61 Malignant neoplasm of prostate: Secondary | ICD-10-CM | POA: Diagnosis not present

## 2019-10-24 LAB — HEMOGLOBIN: Hemoglobin: 11.9 g/dL — ABNORMAL LOW (ref 13.0–17.0)

## 2019-10-24 LAB — HEMATOCRIT: HCT: 35.9 % — ABNORMAL LOW (ref 39.0–52.0)

## 2019-10-24 MED ORDER — SODIUM CHLORIDE 0.9 % IV SOLN
Freq: Once | INTRAVENOUS | Status: DC
  Filled 2019-10-24: qty 250

## 2019-11-04 DIAGNOSIS — L570 Actinic keratosis: Secondary | ICD-10-CM | POA: Diagnosis not present

## 2019-11-04 DIAGNOSIS — D0439 Carcinoma in situ of skin of other parts of face: Secondary | ICD-10-CM | POA: Diagnosis not present

## 2019-11-04 DIAGNOSIS — L578 Other skin changes due to chronic exposure to nonionizing radiation: Secondary | ICD-10-CM | POA: Diagnosis not present

## 2019-11-06 ENCOUNTER — Encounter: Payer: Self-pay | Admitting: Gastroenterology

## 2019-11-06 ENCOUNTER — Other Ambulatory Visit: Payer: Self-pay

## 2019-11-06 ENCOUNTER — Ambulatory Visit: Payer: Medicare HMO | Admitting: Gastroenterology

## 2019-11-06 VITALS — BP 134/93 | HR 79 | Temp 97.4°F | Ht 68.0 in | Wt 169.0 lb

## 2019-11-06 DIAGNOSIS — R197 Diarrhea, unspecified: Secondary | ICD-10-CM

## 2019-11-06 NOTE — Progress Notes (Signed)
Gastroenterology Consultation  Referring Provider:     Hollice Espy, MD Primary Care Physician:  Adin Hector, MD Primary Gastroenterologist:  Dr. Allen Norris     Reason for Consultation:     Diarrhea with fecal incontinence        HPI:   Andrew Walsh is a 79 y.o. y/o male referred for consultation & management of diarrhea with fecal incontinence by Dr. [name], Wendelyn Breslow III, MD.  This patient comes in today with a report of diarrhea with fecal incontinence.  He reports that the symptoms occur only once every 5 days.  In between those times the patient is having normal bowel movements.  He does report that he had a colonoscopy within the last year at [facility] although I cannot find any record of that in his chart.  He states that he is not having any unexplained weight loss fevers chills nausea or vomiting.  He also reports that he was told that his symptoms may be related to his history of radiation for prostate cancer.  The patient has not had any black stools or bloody stools.  He also denies any abdominal pain with his symptoms.  His main symptom is that when he has these attacks every 4 to 5 days he will have profuse diarrhea usually shortly after he eats.  Past Medical History:  Diagnosis Date  . Ankle arthropathy 02/24/2014  . Arthritis of foot, degenerative 10/03/2012   Overview:  Subtalar arthritis   . Cancer ([place])   . Hereditary hemochromatosis ([place]) 11/03/2015   Overview:  Treated by regular blood donation   . History of kidney stones   . HLD (hyperlipidemia) 11/03/2015  . Localized, primary osteoarthritis of ankle or foot 02/24/2014    Past Surgical History:  Procedure Laterality Date  . ANKLE FUSION Right 2001, 2016  . REVISION TOTAL HIP ARTHROPLASTY  2006   Right 2011, Left 2006    Prior to Admission medications   Medication Sig Start Date End Date Taking? Authorizing Provider  Calcium Carbonate-Vitamin D (CALCIUM-VITAMIN D3 PO) Take by mouth.     [provider]  Glucosamine 500 MG CAPS Take by mouth.    [provider]  Multiple Vitamin (MULTI-VITAMINS) TABS Take by mouth.    [provider]  simvastatin (ZOCOR) 20 MG tablet  11/07/15   [provider]    Family History  Problem Relation Age of Onset  . Bladder Cancer Neg Hx   . Prostate cancer Neg Hx   . Kidney cancer Neg Hx      Social History   Tobacco Use  . Smoking status: Former Smoker    Years: 20.00    Types: Cigarettes    Quit date: 09/10/1979    Years since quitting: 40.1  . Smokeless tobacco: Never Used  Substance Use Topics  . Alcohol use: Yes    Alcohol/week: 0.0 standard drinks  . Drug use: No    Allergies as of 11/06/2019  . (No Known Allergies)    Review of Systems:    All systems reviewed and negative except where noted in HPI.   Physical Exam:  There were no vitals taken for this visit. No LMP for male patient. General:   Alert,  Well-developed, well-nourished, pleasant and cooperative in NAD Head:  Normocephalic and atraumatic. Eyes:  Sclera clear, no icterus.   Conjunctiva pink. Ears:  Normal auditory acuity. Neck:  Supple; no masses or thyromegaly. Lungs:  Respirations even and  unlabored.  Clear throughout to auscultation.   No wheezes, crackles, or rhonchi. No acute distress. Heart:  Regular rate and rhythm; no murmurs, clicks, rubs, or gallops. Abdomen:  Normal bowel sounds.  No bruits.  Soft, non-tender and non-distended without masses, hepatosplenomegaly or hernias noted.  No guarding or rebound tenderness.  Negative Carnett sign.   Rectal:  Deferred.  Pulses:  Normal pulses noted. Extremities:  No clubbing or edema.  No cyanosis. Neurologic:  Alert and oriented x3;  grossly normal neurologically. Skin:  Intact without significant lesions or rashes.  No jaundice. Lymph Nodes:  No significant cervical adenopathy. Psych:  Alert and cooperative. Normal mood and affect.  Imaging Studies: No results  found.  Assessment and Plan:   Andrew Walsh is a 79 y.o. y/o male who comes in today with intermittent diarrhea.  The patient states that he is not having every day which would be expected if there was an organic cause.  It is more likely that this is caused by either irritable bowel syndrome or his diet.  Since the patient is only having diarrhea every 4 to 5 days with normal bowel movements between them I have asked him to try and keep a food log to see what foods may be causing him to have more diarrhea.  The patient has also been told to try and avoid milk products since dairy products have been associated with diarrhea and incontinence in the elderly.  The patient states that he will try to avoid dairy products for 2 weeks to see if his symptoms improve and if they do then he will move on to Lactaid milk versus soy milk.  The patient has been explained the plan and agrees with it.    Lucilla Lame, MD. [facility]    Note: This dictation was prepared with Dragon dictation along with smaller phrase technology. Any transcriptional errors that result from this process are unintentional.

## 2019-11-07 ENCOUNTER — Other Ambulatory Visit: Payer: Self-pay

## 2019-11-07 ENCOUNTER — Inpatient Hospital Stay: Payer: Medicare HMO

## 2019-11-07 DIAGNOSIS — C61 Malignant neoplasm of prostate: Secondary | ICD-10-CM | POA: Diagnosis not present

## 2019-11-07 LAB — HEMOGLOBIN: Hemoglobin: 12.3 g/dL — ABNORMAL LOW (ref 13.0–17.0)

## 2019-11-07 LAB — HEMATOCRIT: HCT: 37.3 % — ABNORMAL LOW (ref 39.0–52.0)

## 2019-11-17 DIAGNOSIS — E7849 Other hyperlipidemia: Secondary | ICD-10-CM | POA: Diagnosis not present

## 2019-11-17 DIAGNOSIS — C61 Malignant neoplasm of prostate: Secondary | ICD-10-CM | POA: Diagnosis not present

## 2019-11-24 DIAGNOSIS — C61 Malignant neoplasm of prostate: Secondary | ICD-10-CM | POA: Diagnosis not present

## 2019-11-24 DIAGNOSIS — R739 Hyperglycemia, unspecified: Secondary | ICD-10-CM | POA: Diagnosis not present

## 2019-11-24 DIAGNOSIS — E785 Hyperlipidemia, unspecified: Secondary | ICD-10-CM | POA: Diagnosis not present

## 2019-11-24 DIAGNOSIS — M19079 Primary osteoarthritis, unspecified ankle and foot: Secondary | ICD-10-CM | POA: Diagnosis not present

## 2019-11-24 DIAGNOSIS — Z0000 Encounter for general adult medical examination without abnormal findings: Secondary | ICD-10-CM | POA: Diagnosis not present

## 2019-11-24 DIAGNOSIS — R5383 Other fatigue: Secondary | ICD-10-CM | POA: Diagnosis not present

## 2019-11-24 DIAGNOSIS — R0600 Dyspnea, unspecified: Secondary | ICD-10-CM | POA: Diagnosis not present

## 2019-11-24 DIAGNOSIS — Z85828 Personal history of other malignant neoplasm of skin: Secondary | ICD-10-CM | POA: Diagnosis not present

## 2019-12-05 ENCOUNTER — Inpatient Hospital Stay: Payer: Medicare HMO | Attending: Oncology

## 2019-12-05 ENCOUNTER — Inpatient Hospital Stay: Payer: Medicare HMO

## 2019-12-05 ENCOUNTER — Other Ambulatory Visit: Payer: Self-pay

## 2019-12-05 ENCOUNTER — Inpatient Hospital Stay: Payer: Medicare HMO | Admitting: Oncology

## 2019-12-05 ENCOUNTER — Encounter: Payer: Self-pay | Admitting: Oncology

## 2019-12-05 DIAGNOSIS — Z7289 Other problems related to lifestyle: Secondary | ICD-10-CM | POA: Diagnosis not present

## 2019-12-05 DIAGNOSIS — F1010 Alcohol abuse, uncomplicated: Secondary | ICD-10-CM | POA: Insufficient documentation

## 2019-12-05 DIAGNOSIS — Z79899 Other long term (current) drug therapy: Secondary | ICD-10-CM | POA: Diagnosis not present

## 2019-12-05 DIAGNOSIS — Z87891 Personal history of nicotine dependence: Secondary | ICD-10-CM | POA: Insufficient documentation

## 2019-12-05 DIAGNOSIS — Z923 Personal history of irradiation: Secondary | ICD-10-CM | POA: Insufficient documentation

## 2019-12-05 DIAGNOSIS — Z8546 Personal history of malignant neoplasm of prostate: Secondary | ICD-10-CM | POA: Diagnosis not present

## 2019-12-05 DIAGNOSIS — Z789 Other specified health status: Secondary | ICD-10-CM

## 2019-12-05 DIAGNOSIS — D539 Nutritional anemia, unspecified: Secondary | ICD-10-CM | POA: Insufficient documentation

## 2019-12-05 DIAGNOSIS — C61 Malignant neoplasm of prostate: Secondary | ICD-10-CM | POA: Diagnosis not present

## 2019-12-05 LAB — HEMATOCRIT: HCT: 37.2 % — ABNORMAL LOW (ref 39.0–52.0)

## 2019-12-05 LAB — HEMOGLOBIN: Hemoglobin: 13.2 g/dL (ref 13.0–17.0)

## 2019-12-05 MED ORDER — SODIUM CHLORIDE 0.9 % IV SOLN
Freq: Once | INTRAVENOUS | Status: AC
  Filled 2019-12-05: qty 250

## 2019-12-05 MED ORDER — VITAMIN B-12 1000 MCG PO TABS: 1000.0000 ug | ORAL_TABLET | Freq: Every day | ORAL | 1 refills | Status: DC

## 2019-12-05 NOTE — Progress Notes (Signed)
Hematology/Oncology note [facility] [phone] [phone]   Patient Care Team: Adin Hector, MD as PCP - General (Internal Medicine)  REFERRING PROVIDER: Adin Hector, MD  CHIEF COMPLAINTS/REASON FOR VISIT:  Establish care for hemochromatosis management  HISTORY OF PRESENTING ILLNESS:  Andrew Walsh is a 79 y.o. male who was seen in consultation at the request of Adin Hector, MD for evaluation of elevated ferritin level/hemochromatosis management. Patient reports a history  hereditary hemochromatosis.  He was diagnosed in 105s by his previous primary care provider in [place] and he has been an active blood donor every 8 to 10 weeks for many years.  Recently he was diagnosed with prostate cancer Gleason score 4+3 with a PSA of 9.4 and underwent IMRT radiation by Dr. Baruch Gouty and he finished in September 2020. He is mildly anemic and also cannot further donate blood due to recent radiation and was referred to me for management of hemochromatosis and iron overload.   Context Shortness of breath: Patient reports mild shortness of breath after exertion recently and he attributes to Covid pandemic and not able to be physically active lately. Fatigue: denies He had blood work done at [facility] primary care provider's office recently. 08/19/2019, hemoglobin 14, hematocrit 40.7, MCV 105.7. Iron 254, ferritin 514,  07/18/2019, iron 259, ferritin 476, transferrin 193.5, TIBC 270, iron saturation 96  INTERVAL HISTORY Andrew Walsh is a 79 y.o. male who has above history reviewed by me today presents for follow up visit for management of iron overload/hemochromatosis. Problems and complaints are listed below: Patient has been on phlebotomy monthly. He reports feeling some dizziness after each phlebotomy. Otherwise he tolerates fine. He drinks couple glasses of wine daily.  Review of Systems  Constitutional: Negative for  appetite change, chills, fatigue, fever and unexpected weight change.  HENT:   Negative for hearing loss and voice change.   Eyes: Negative for eye problems and icterus.  Respiratory: Negative for chest tightness, cough and shortness of breath.   Cardiovascular: Negative for chest pain and leg swelling.  Gastrointestinal: Negative for abdominal distention and abdominal pain.  Endocrine: Negative for hot flashes.  Genitourinary: Negative for difficulty urinating, dysuria and frequency.   Musculoskeletal: Negative for arthralgias.  Skin: Negative for itching and rash.  Neurological: Negative for light-headedness and numbness.  Hematological: Negative for adenopathy. Does not bruise/bleed easily.  Psychiatric/Behavioral: Negative for confusion.    MEDICAL HISTORY:  Past Medical History:  Diagnosis Date  . Ankle arthropathy 02/24/2014  . Arthritis of foot, degenerative 10/03/2012   Overview:  Subtalar arthritis   . Cancer ([place])   . Hereditary hemochromatosis ([place]) 11/03/2015   Overview:  Treated by regular blood donation   . History of kidney stones   . HLD (hyperlipidemia) 11/03/2015  . Localized, primary osteoarthritis of ankle or foot 02/24/2014    SURGICAL HISTORY: Past Surgical History:  Procedure Laterality Date  . ANKLE FUSION Right 2001, 2016  . REVISION TOTAL HIP ARTHROPLASTY  2006   Right 2011, Left 2006    SOCIAL HISTORY: Social History   Socioeconomic History  . Marital status: Married    Spouse name: Not on file  . Number of children: Not on file  . Years of education: Not on file  . Highest education level: Not on file  Occupational History  . Not on file  Tobacco Use  . Smoking status: Former Smoker    Years: 20.00    Types: Cigarettes  Quit date: 09/10/1979    Years since quitting: 40.2  . Smokeless tobacco: Never Used  Substance and Sexual Activity  . Alcohol use: Yes    Alcohol/week: 0.0 standard drinks  . Drug use: No  . Sexual activity: Not on  file  Other Topics Concern  . Not on file  Social History Narrative  . Not on file   Social Determinants of Health   Financial Resource Strain:   . Difficulty of Paying Living Expenses:   Food Insecurity:   . Worried About Charity fundraiser in the Last Year:   . Arboriculturist in the Last Year:   Transportation Needs:   . Film/video editor (Medical):   Marland Kitchen Lack of Transportation (Non-Medical):   Physical Activity:   . Days of Exercise per Week:   . Minutes of Exercise per Session:   Stress:   . Feeling of Stress :   Social Connections:   . Frequency of Communication with Friends and Family:   . Frequency of Social Gatherings with Friends and Family:   . Attends Religious Services:   . Active Member of Clubs or Organizations:   . Attends Archivist Meetings:   Marland Kitchen Marital Status:   Intimate Partner Violence:   . Fear of Current or Ex-Partner:   . Emotionally Abused:   Marland Kitchen Physically Abused:   . Sexually Abused:     FAMILY HISTORY: Family History  Problem Relation Age of Onset  . Bladder Cancer Neg Hx   . Prostate cancer Neg Hx   . Kidney cancer Neg Hx     ALLERGIES:  has No Known Allergies.  MEDICATIONS:  Current Outpatient Medications  Medication Sig Dispense Refill  . Calcium Carbonate-Vitamin D (CALCIUM-VITAMIN D3 PO) Take by mouth.    . Glucosamine 500 MG CAPS Take by mouth.    . Multiple Vitamin (MULTI-VITAMINS) TABS Take by mouth.    . simvastatin (ZOCOR) 20 MG tablet      No current facility-administered medications for this visit.     PHYSICAL EXAMINATION: ECOG PERFORMANCE STATUS: 1 - Symptomatic but completely ambulatory Vitals:   12/05/19 1341  BP: (!) 158/84  Pulse: (!) 52  Resp: 18  Temp: (!) 97.3 F (36.3 C)   Filed Weights   12/05/19 1341  Weight: 168 lb 4.8 oz (76.3 kg)    Physical Exam Constitutional:      General: He is not in acute distress. HENT:     Head: Normocephalic and atraumatic.  Eyes:     General: No  scleral icterus.    Pupils: Pupils are equal, round, and reactive to light.  Cardiovascular:     Rate and Rhythm: Normal rate and regular rhythm.     Heart sounds: Normal heart sounds.  Pulmonary:     Effort: Pulmonary effort is normal. No respiratory distress.     Breath sounds: No wheezing.  Abdominal:     General: Bowel sounds are normal. There is no distension.     Palpations: Abdomen is soft. There is no mass.     Tenderness: There is no abdominal tenderness.  Musculoskeletal:        General: No swelling or deformity. Normal range of motion.     Cervical back: Normal range of motion and neck supple.  Skin:    General: Skin is warm and dry.     Findings: No erythema or rash.  Neurological:     Mental Status: He is alert and oriented to  person, place, and time.     Cranial Nerves: No cranial nerve deficit.     Coordination: Coordination normal.  Psychiatric:        Mood and Affect: Mood normal.        Behavior: Behavior normal.        Thought Content: Thought content normal.      LABORATORY DATA:  I have reviewed the data as listed Lab Results  Component Value Date   WBC 4.0 09/10/2019   HGB 13.2 12/05/2019   HCT 37.2 (L) 12/05/2019   MCV 106.5 (H) 09/10/2019   PLT 186 09/10/2019   Recent Labs    02/06/19 1407 04/02/19 0933 09/10/19 1148  NA  --   --  137  K  --   --  4.2  CL  --   --  104  CO2  --   --  24  GLUCOSE  --   --  130*  BUN  --   --  15  CREATININE 0.90 0.90 0.79  CALCIUM  --   --  9.1  GFRNONAA  --   --  >60  GFRAA  --   --  >60  PROT  --   --  6.9  ALBUMIN  --   --  4.1  AST  --   --  38  ALT  --   --  37  ALKPHOS  --   --  53  BILITOT  --   --  0.8   Iron/TIBC/Ferritin/ %Sat    Component Value Date/Time   IRON 169 09/10/2019 1148   TIBC 275 09/10/2019 1148   FERRITIN 515 (H) 09/10/2019 1148   IRONPCTSAT 61 (H) 09/10/2019 1148      RADIOGRAPHIC STUDIES: I have personally reviewed the radiological images as listed and agreed  with the findings in the report.  No results found.   ASSESSMENT & PLAN:  1. Hereditary hemochromatosis ([place])   2. Malignant neoplasm of prostate ([place])   3. Macrocytic anemia   4. Alcohol use    #History of hereditary hemochromatosis.  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 Labs reviewed and discussed with patient. Hemoglobin 13.2, hematocrit 37.2 Patient had blood work done with primary care provider on 11/17/2019.  Ferritin decreased to 232, TIBC 295, iron saturation 49.  I discussed with patient that I recommend to keep his ferritin at least less than 100, preferably less than 50. Proceed with phlebotomy today. Patient will receive IV fluid after phlebotomy sessions.  #History of prostate cancer, status post radiation.  Follows up with Dr. Baruch Gouty.  #Macrocytic anemia, normal vitamin B12 and folate level. Likely due to chronic alcohol use.  Cannot rule out underlying MDS. Advise patient to take oral vitamin b12 supplementation maintenance. Rx sent to pharmacy #Chronic alcohol use, moderate amount.  Alcohol cessation was discussed with patient again.  Orders Placed This Encounter  Procedures  . Iron and TIBC    Standing Status:   Future    Standing Expiration Date:   12/04/2020  . Ferritin    Standing Status:   Future    Standing Expiration Date:   06/06/2021  . CBC with Differential/Platelet    Standing Status:   Future    Standing Expiration Date:   06/06/2021  . Comprehensive metabolic panel    Standing Status:   Future    Standing Expiration Date:   06/06/2021    All questions were answered. The patient knows to call the clinic with  any problems questions or concerns. Return of visit:  10 weeks  Earlie Server, MD, PhD Hematology Oncology [facility] Pager- [phone] 12/05/2019

## 2019-12-05 NOTE — Progress Notes (Signed)
Patient here for follow up. Pt reports completing radiation at the end of September and has had loose stools since then.

## 2019-12-22 DIAGNOSIS — Z85828 Personal history of other malignant neoplasm of skin: Secondary | ICD-10-CM | POA: Diagnosis not present

## 2019-12-22 DIAGNOSIS — R5383 Other fatigue: Secondary | ICD-10-CM | POA: Diagnosis not present

## 2019-12-22 DIAGNOSIS — C61 Malignant neoplasm of prostate: Secondary | ICD-10-CM | POA: Diagnosis not present

## 2019-12-22 DIAGNOSIS — R2689 Other abnormalities of gait and mobility: Secondary | ICD-10-CM | POA: Diagnosis not present

## 2019-12-22 DIAGNOSIS — E785 Hyperlipidemia, unspecified: Secondary | ICD-10-CM | POA: Diagnosis not present

## 2019-12-22 DIAGNOSIS — R600 Localized edema: Secondary | ICD-10-CM | POA: Diagnosis not present

## 2019-12-22 DIAGNOSIS — R739 Hyperglycemia, unspecified: Secondary | ICD-10-CM | POA: Diagnosis not present

## 2019-12-22 DIAGNOSIS — R0600 Dyspnea, unspecified: Secondary | ICD-10-CM | POA: Diagnosis not present

## 2020-01-02 ENCOUNTER — Other Ambulatory Visit: Payer: Self-pay

## 2020-01-02 ENCOUNTER — Inpatient Hospital Stay: Payer: Medicare HMO | Attending: Oncology

## 2020-01-02 ENCOUNTER — Inpatient Hospital Stay: Payer: Medicare HMO

## 2020-01-02 DIAGNOSIS — F1010 Alcohol abuse, uncomplicated: Secondary | ICD-10-CM | POA: Diagnosis not present

## 2020-01-02 DIAGNOSIS — Z8546 Personal history of malignant neoplasm of prostate: Secondary | ICD-10-CM | POA: Diagnosis not present

## 2020-01-02 DIAGNOSIS — Z923 Personal history of irradiation: Secondary | ICD-10-CM | POA: Diagnosis not present

## 2020-01-02 DIAGNOSIS — Z87891 Personal history of nicotine dependence: Secondary | ICD-10-CM | POA: Diagnosis not present

## 2020-01-02 DIAGNOSIS — D539 Nutritional anemia, unspecified: Secondary | ICD-10-CM | POA: Insufficient documentation

## 2020-01-02 DIAGNOSIS — Z79899 Other long term (current) drug therapy: Secondary | ICD-10-CM | POA: Insufficient documentation

## 2020-01-02 LAB — HEMATOCRIT: HCT: 38.4 % — ABNORMAL LOW (ref 39.0–52.0)

## 2020-01-02 LAB — HEMOGLOBIN: Hemoglobin: 13.7 g/dL (ref 13.0–17.0)

## 2020-01-02 MED ORDER — SODIUM CHLORIDE 0.9 % IV SOLN
Freq: Once | INTRAVENOUS | Status: AC
  Filled 2020-01-02: qty 250

## 2020-01-27 ENCOUNTER — Telehealth: Payer: Self-pay | Admitting: *Deleted

## 2020-01-27 IMAGING — NM NUCLEAR MEDICINE WHOLE BODY BONE SCINTIGRAPHY
2 series · 12 of 12 positions shown · non-contrast
Comparison: None

Correlation: CT abdomen and pelvis 04/02/2019

CLINICAL DATA: Prostate cancer, initial staging

EXAM:
NUCLEAR MEDICINE WHOLE BODY BONE SCAN
TECHNIQUE: Whole body anterior and posterior images were obtained approximately
3 hours after intravenous injection of radiopharmaceutical.
RADIOPHARMACEUTICALS:  23.98 mCi 4echnetium-HHm MDP IV

[Series 1000: statics · 2.40mm/px · 5 acquisitions, 10 frames shown]
[im 1/5]
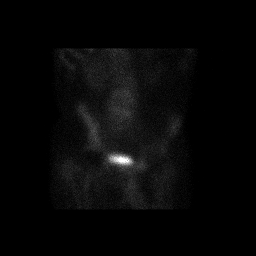
[im 1/5]
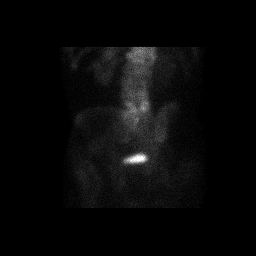
[im 2/5]
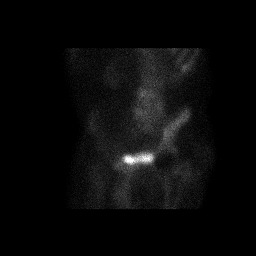
[im 2/5]
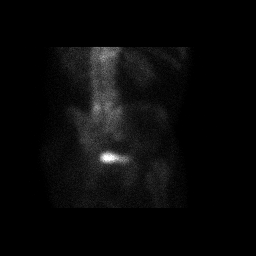
[im 3/5]
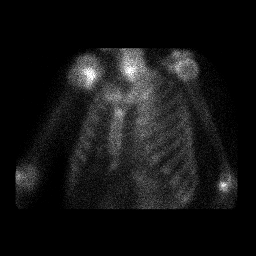
[im 3/5]
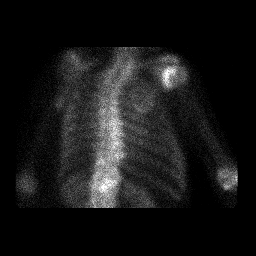
[im 4/5]
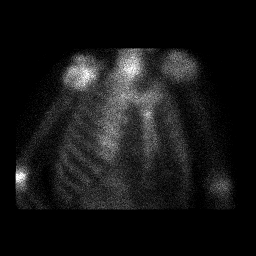
[im 4/5]
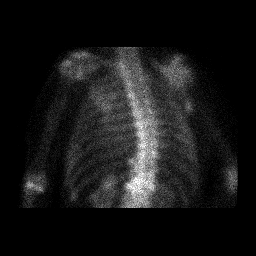
[im 5/5]
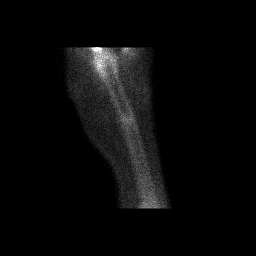
[im 5/5]
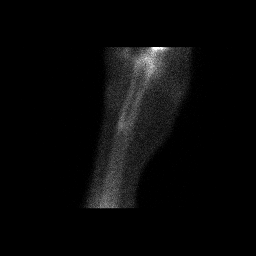

[Series 1000: 3 hr wholebody · 2.40mm/px · 2 of 2 frames shown]
[frame 1/2]
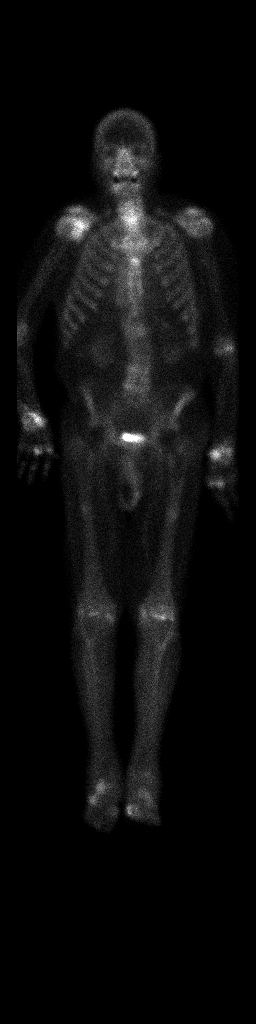
[frame 2/2]
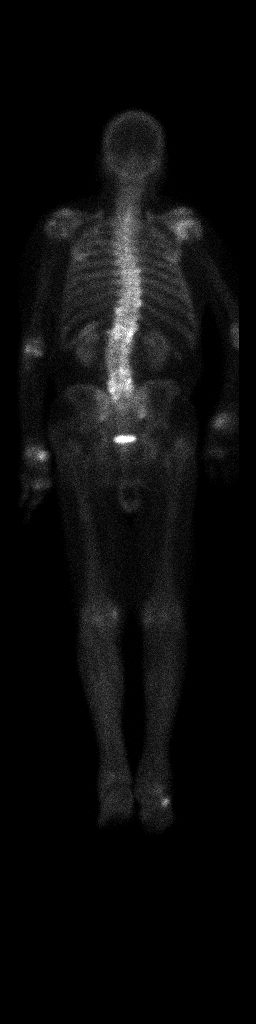

[12 of 12 positions shown; findings below may reference images not displayed]

FINDINGS: Uptake in shoulders, elbows, wrists/hands, knees, and feet,
typically degenerative.

Biconvex thoracolumbar scoliosis.

Uptake at multiple levels of the thoracic and lumbar spine
laterally, likely degenerative; accompanying CT demonstrates severe
multilevel degenerative disc and facet disease changes of the
thoracolumbar spine.

Photopenic defects at the hips bilaterally compatible with hip
prostheses.

Focal subtle uptake of tracer is seen at the tips of the femoral
components, less at the proximal aspects of the femoral components
bilaterally, nonspecific but could reflect prosthetic loosening if
patient is symptomatic at the hips.

No worrisome sites of osseous tracer accumulation are seen to
suggest osseous metastases.

Expected urinary tract and soft tissue distribution of tracer.
IMPRESSION: No scintigraphic evidence of osseous metastatic disease.

Extensive degenerative changes and thoracolumbar scoliosis as above.

BILATERAL hip prostheses with subtle uptake of tracer adjacent to
the femoral components, nonspecific but could reflect subtle
prosthetic loosening if patient is symptomatic at the hips.

## 2020-01-27 IMAGING — CT CT ABDOMEN AND PELVIS WITH CONTRAST
2 of 6 series · 13 of 46 positions shown, 15 images · IV contrast (omnipaque)
Comparison: None.

CLINICAL DATA: New diagnosis prostate cancer.

EXAM:
CT ABDOMEN AND PELVIS WITH CONTRAST
TECHNIQUE: Multidetector CT imaging of the abdomen and pelvis was performed
using the standard protocol following bolus administration of
intravenous contrast.
CONTRAST:  100mL OMNIPAQUE IOHEXOL 300 MG/ML  SOLN

[Series 5: coronal st · coronal · 0.72mm/px · 3 of 92 slices shown]
[im 31/92  soft-tissue]
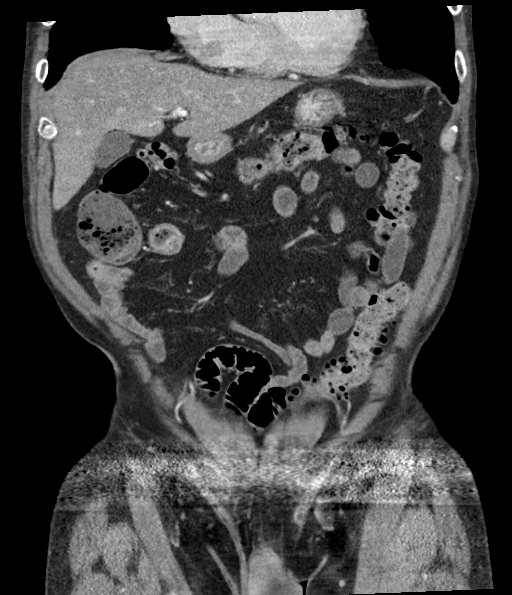
[im 41/92  soft-tissue]
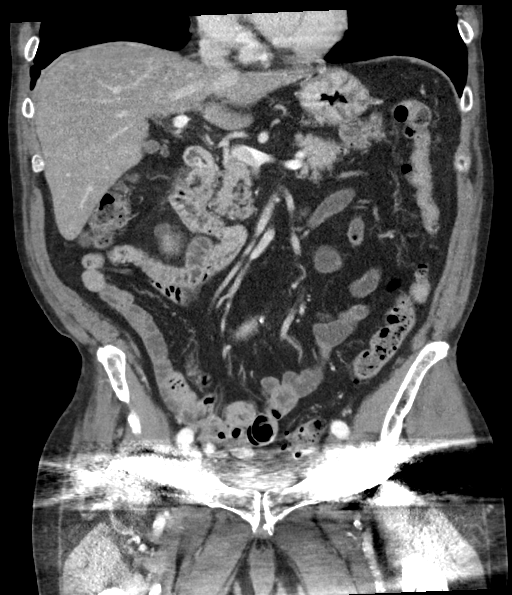
[im 51/92  soft-tissue]
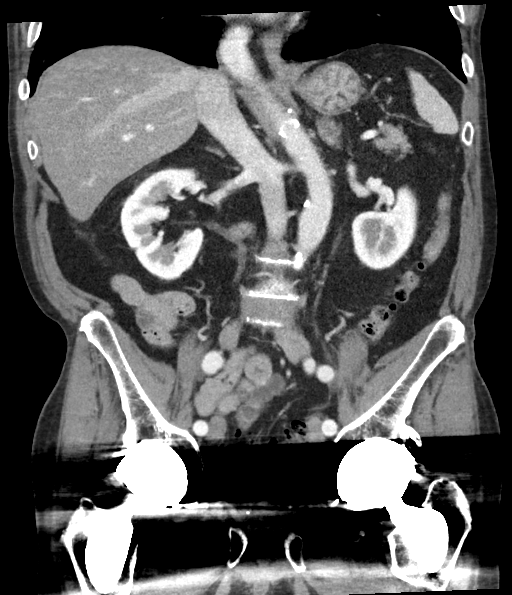

[Series 8: routine abd/pel with (person_name) · axial · 0.72mm/px · z∈[-490,-145]mm · 10 of 85 slices shown, 12 images]
[im 8/85  soft-tissue]
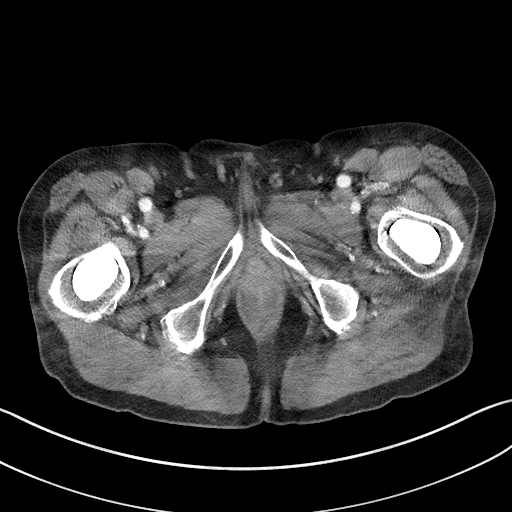
[im 8/85  bone]
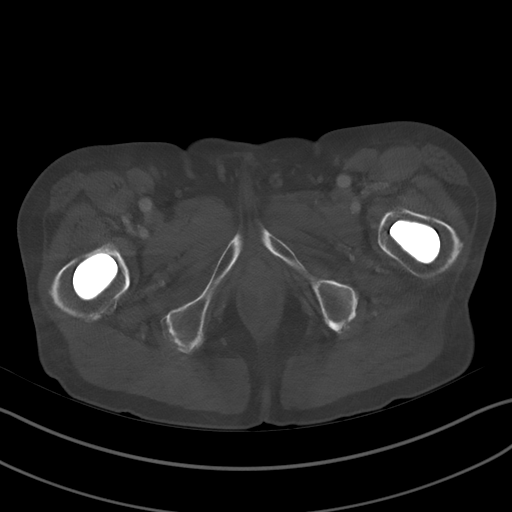
[im 16/85  soft-tissue]
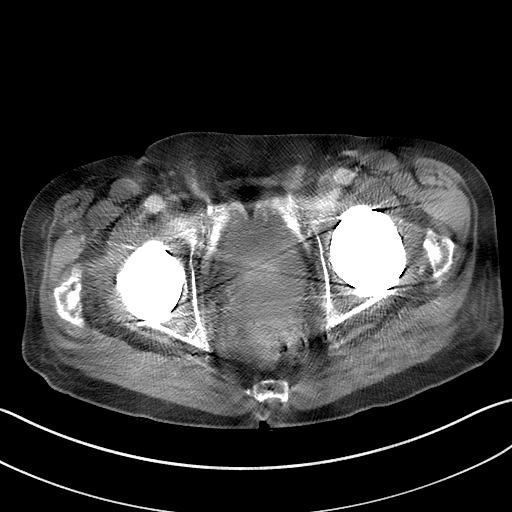
[im 23/85  soft-tissue]
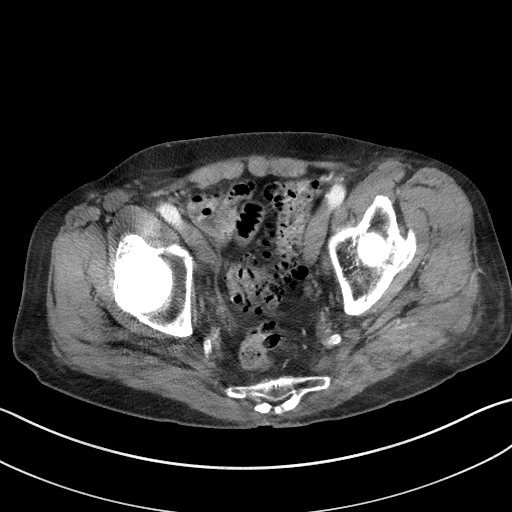
[im 31/85  soft-tissue]
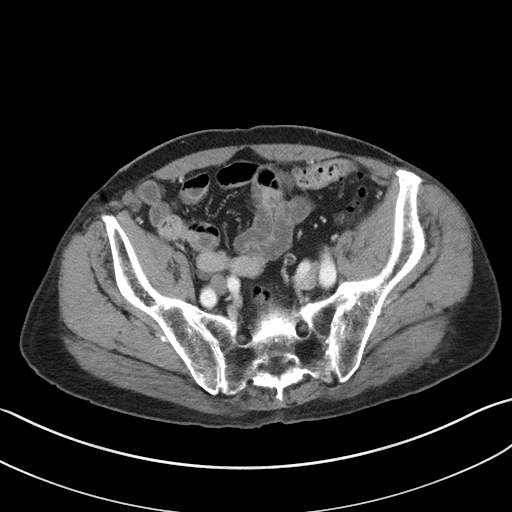
[im 39/85  soft-tissue]
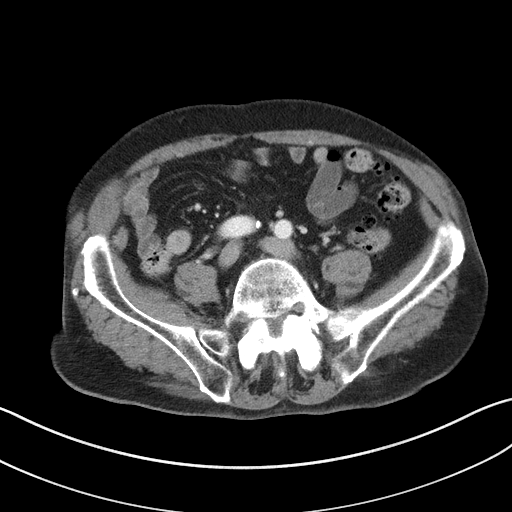
[im 46/85  soft-tissue]
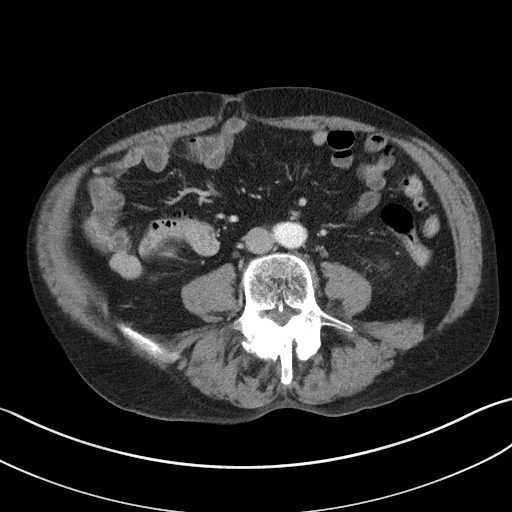
[im 54/85  soft-tissue]
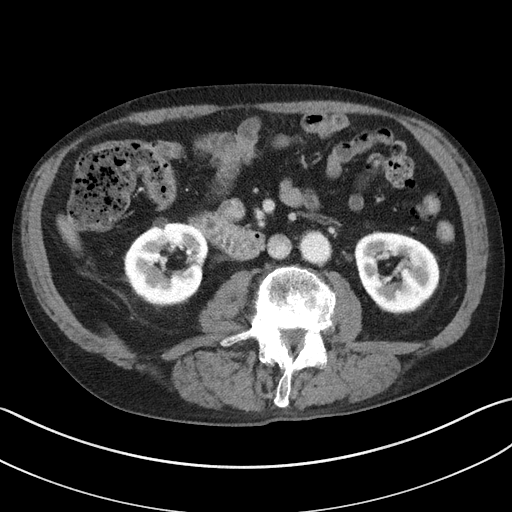
[im 62/85  soft-tissue]
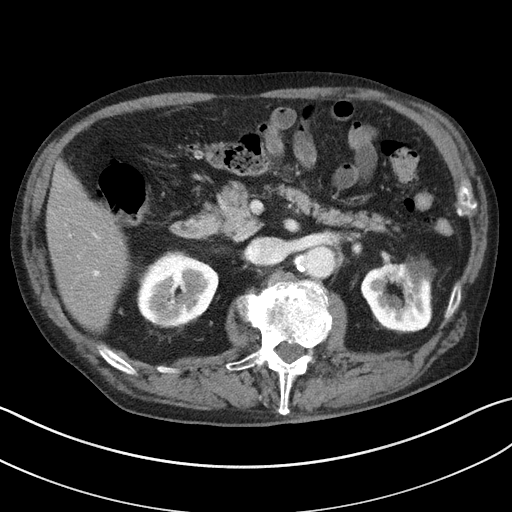
[im 69/85  soft-tissue]
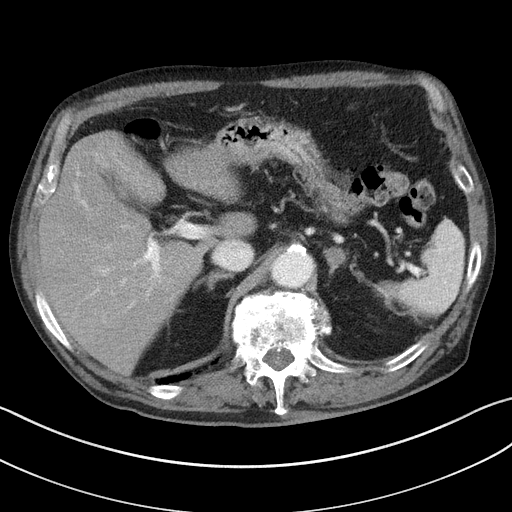
[im 69/85  bone]
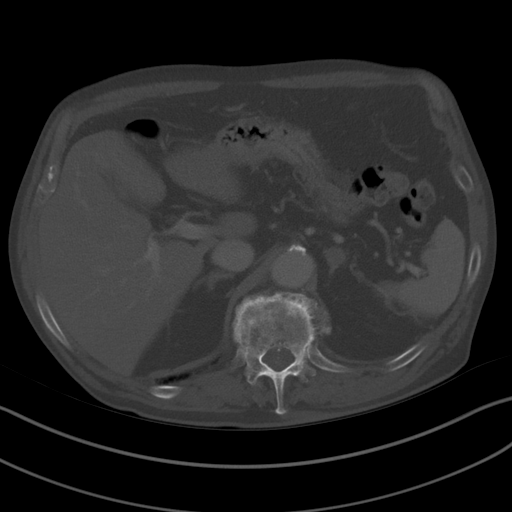
[im 77/85  soft-tissue]
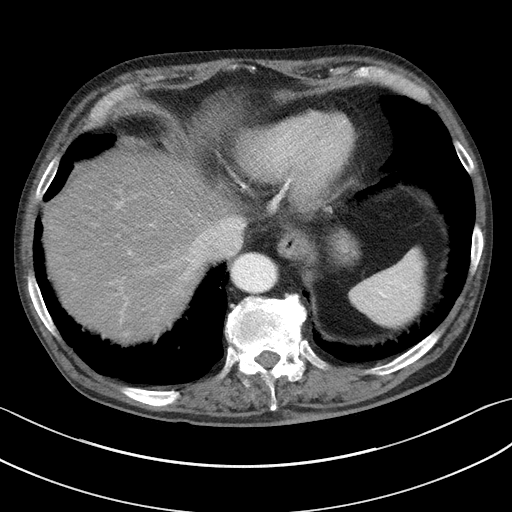

[13 of 46 positions shown; findings below may reference images not displayed]

FINDINGS: Lower chest: Unremarkable.

Hepatobiliary: No suspicious focal abnormality within the liver
parenchyma. There is no evidence for gallstones, gallbladder wall
thickening, or pericholecystic fluid. No intrahepatic or
extrahepatic biliary dilation.

Pancreas: No focal mass lesion. No dilatation of the main duct. No
intraparenchymal cyst. No peripancreatic edema.

Spleen: No splenomegaly. No focal mass lesion.

Adrenals/Urinary Tract: 12 mm left adrenal nodule demonstrates
relative washout of 63%, compatible with adenoma. Right adrenal
gland unremarkable. 2.8 cm interpolar right renal cysts demonstrates
a probable thin septation posteriorly. 10 mm low-density lesion
lower pole right kidney is likely a cyst. 3.6 cm cyst interpolar
left kidney has simple features. Proximal ureters unremarkable.
Distal ureters and bladder obscured by streak artifact from
bilateral hip replacement.

Stomach/Bowel: Stomach is unremarkable. No gastric wall thickening.
No evidence of outlet obstruction. Abnormal position of the duodenum
and ligament of Treitz with duodenum extending inferiorly anterior
to the right kidney. No small bowel wall thickening. No small bowel
dilatation. Cecum is located high in the right upper quadrant along
the under surface of the liver. The terminal ileum is normal. The
appendix is normal. No gross colonic mass. No colonic wall
thickening. Diverticuli are seen scattered along the entire length
of the colon without CT findings of diverticulitis.

Vascular/Lymphatic: There is abdominal aortic atherosclerosis
without aneurysm. There is no gastrohepatic or hepatoduodenal
ligament lymphadenopathy. No intraperitoneal or retroperitoneal
lymphadenopathy. No common iliac lymphadenopathy. Pelvic sidewalls
are obscured by streak artifact bilaterally.

Reproductive: Prostate gland obscured by artifact.

Other: No intraperitoneal free fluid. Although lower pelvis is
obscured by artifact.

Musculoskeletal: Bilateral total hip replacement. Bones are
diffusely demineralized. Degenerative changes noted lumbar spine
with convex leftward scoliosis.
IMPRESSION: 1. No evidence for metastatic disease in the abdomen or pelvis
although the inferior pelvis is obscured by beam hardening artifact
from bilateral hip replacement.
2. 12 mm left adrenal adenoma.
3. Bilateral simple renal cysts with 2.8 cm Bosniak II cyst
interpolar right kidney.
4. Intestinal malrotation.
5.  Aortic Atherosclerois ([id number]-170.0)

## 2020-01-27 NOTE — Telephone Encounter (Signed)
Per pt request to have appt his 01/30/20 Lab/phleb & 500cc IVF appts R/S 1-2 weeks out due to a family emergency.  Appt were R/S to 02/11/20.

## 2020-01-30 ENCOUNTER — Inpatient Hospital Stay: Payer: Medicare HMO

## 2020-02-06 ENCOUNTER — Other Ambulatory Visit: Payer: Self-pay

## 2020-02-10 ENCOUNTER — Other Ambulatory Visit: Payer: Self-pay

## 2020-02-10 ENCOUNTER — Inpatient Hospital Stay: Payer: Medicare HMO

## 2020-02-10 ENCOUNTER — Inpatient Hospital Stay: Payer: Medicare HMO | Attending: Oncology

## 2020-02-10 DIAGNOSIS — Z79899 Other long term (current) drug therapy: Secondary | ICD-10-CM | POA: Diagnosis not present

## 2020-02-10 DIAGNOSIS — C61 Malignant neoplasm of prostate: Secondary | ICD-10-CM | POA: Diagnosis not present

## 2020-02-10 DIAGNOSIS — D539 Nutritional anemia, unspecified: Secondary | ICD-10-CM | POA: Diagnosis not present

## 2020-02-10 DIAGNOSIS — Z923 Personal history of irradiation: Secondary | ICD-10-CM | POA: Diagnosis not present

## 2020-02-10 LAB — HEMOGLOBIN AND HEMATOCRIT, BLOOD
HCT: 37.3 % — ABNORMAL LOW (ref 39.0–52.0)
Hemoglobin: 13.2 g/dL (ref 13.0–17.0)

## 2020-02-10 MED ORDER — SODIUM CHLORIDE 0.9 % IV SOLN
Freq: Once | INTRAVENOUS | Status: AC
  Filled 2020-02-10: qty 250

## 2020-02-10 NOTE — Progress Notes (Signed)
1405: Per Benjamine Mola RN per Dr. Tasia Catchings labs reviewed and to proceed with 500 cc Phlebotomy and 500 cc NS.   1418: Therapeutic phlebotomy performed per MD order removing 500 cc using 20 gauge PIV in left AC.  1419: 500 cc NS started per MD order.  1420: Per Benjamine Mola RN per Dr. Tasia Catchings okay to run NS at 999 ml/hr.  1452: Pt and VS stable at discharge, no s/s of distress noted.

## 2020-03-01 ENCOUNTER — Other Ambulatory Visit: Payer: Self-pay

## 2020-03-01 ENCOUNTER — Inpatient Hospital Stay: Payer: Medicare HMO

## 2020-03-01 DIAGNOSIS — C61 Malignant neoplasm of prostate: Secondary | ICD-10-CM

## 2020-03-01 DIAGNOSIS — D539 Nutritional anemia, unspecified: Secondary | ICD-10-CM

## 2020-03-01 DIAGNOSIS — Z923 Personal history of irradiation: Secondary | ICD-10-CM | POA: Diagnosis not present

## 2020-03-01 DIAGNOSIS — Z79899 Other long term (current) drug therapy: Secondary | ICD-10-CM | POA: Diagnosis not present

## 2020-03-01 DIAGNOSIS — Z789 Other specified health status: Secondary | ICD-10-CM

## 2020-03-01 LAB — COMPREHENSIVE METABOLIC PANEL
ALT: 29 U/L (ref 0–44)
AST: 31 U/L (ref 15–41)
Albumin: 4.1 g/dL (ref 3.5–5.0)
Alkaline Phosphatase: 52 U/L (ref 38–126)
Anion gap: 9 (ref 5–15)
BUN: 12 mg/dL (ref 8–23)
CO2: 27 mmol/L (ref 22–32)
Calcium: 9.2 mg/dL (ref 8.9–10.3)
Chloride: 102 mmol/L (ref 98–111)
Creatinine, Ser: 0.89 mg/dL (ref 0.61–1.24)
GFR calc Af Amer: >60 mL/min (ref >60)
GFR calc non Af Amer: >60 mL/min (ref >60)
Glucose, Bld: 94 mg/dL (ref 70–99)
Potassium: 4.4 mmol/L (ref 3.5–5.1)
Sodium: 138 mmol/L (ref 135–145)
Total Bilirubin: 0.9 mg/dL (ref 0.3–1.2)
Total Protein: 7.3 g/dL (ref 6.5–8.1)

## 2020-03-01 LAB — CBC WITH DIFFERENTIAL/PLATELET
Abs Immature Granulocytes: 0.04 10*3/uL (ref 0.00–0.07)
Basophils Absolute: 0 10*3/uL (ref 0.0–0.1)
Basophils Relative: 1 %
Eosinophils Absolute: 0.1 10*3/uL (ref 0.0–0.5)
Eosinophils Relative: 3 %
HCT: 37.1 % — ABNORMAL LOW (ref 39.0–52.0)
Hemoglobin: 13.1 g/dL (ref 13.0–17.0)
Immature Granulocytes: 1 %
Lymphocytes Relative: 22 %
Lymphs Abs: 1.1 10*3/uL (ref 0.7–4.0)
MCH: 37 pg — ABNORMAL HIGH (ref 26.0–34.0)
MCHC: 35.3 g/dL (ref 30.0–36.0)
MCV: 104.8 fL — ABNORMAL HIGH (ref 80.0–100.0)
Monocytes Absolute: 0.6 10*3/uL (ref 0.1–1.0)
Monocytes Relative: 13 %
Neutro Abs: 2.9 10*3/uL (ref 1.7–7.7)
Neutrophils Relative %: 60 %
Platelets: 229 10*3/uL (ref 150–400)
RBC: 3.54 MIL/uL — ABNORMAL LOW (ref 4.22–5.81)
RDW: 13.4 % (ref 11.5–15.5)
WBC: 4.7 10*3/uL (ref 4.0–10.5)
nRBC: 0 % (ref 0.0–0.2)

## 2020-03-01 LAB — IRON AND TIBC
Iron: 243 ug/dL — ABNORMAL HIGH (ref 45–182)
Saturation Ratios: 79 % — ABNORMAL HIGH (ref 17.9–39.5)
TIBC: 308 ug/dL (ref 250–450)
UIBC: 65 ug/dL

## 2020-03-01 LAB — PSA: Prostatic Specific Antigen: <0.01 ng/mL (ref 0.00–4.00)

## 2020-03-01 LAB — FERRITIN: Ferritin: 167 ng/mL (ref 24–336)

## 2020-03-03 ENCOUNTER — Other Ambulatory Visit: Payer: Self-pay

## 2020-03-03 ENCOUNTER — Encounter: Payer: Self-pay | Admitting: Oncology

## 2020-03-03 ENCOUNTER — Inpatient Hospital Stay: Payer: Medicare HMO | Admitting: Oncology

## 2020-03-03 ENCOUNTER — Inpatient Hospital Stay: Payer: Medicare HMO

## 2020-03-03 DIAGNOSIS — C61 Malignant neoplasm of prostate: Secondary | ICD-10-CM

## 2020-03-03 DIAGNOSIS — Z923 Personal history of irradiation: Secondary | ICD-10-CM | POA: Diagnosis not present

## 2020-03-03 DIAGNOSIS — Z79899 Other long term (current) drug therapy: Secondary | ICD-10-CM | POA: Diagnosis not present

## 2020-03-03 DIAGNOSIS — F1090 Alcohol use, unspecified, uncomplicated: Secondary | ICD-10-CM

## 2020-03-03 DIAGNOSIS — Z789 Other specified health status: Secondary | ICD-10-CM

## 2020-03-03 DIAGNOSIS — D539 Nutritional anemia, unspecified: Secondary | ICD-10-CM

## 2020-03-03 DIAGNOSIS — Z7289 Other problems related to lifestyle: Secondary | ICD-10-CM

## 2020-03-03 MED ORDER — SODIUM CHLORIDE 0.9 % IV SOLN
Freq: Once | INTRAVENOUS | Status: AC
  Administered 2020-03-03: 500 mL via INTRAVENOUS
  Filled 2020-03-03: qty 250

## 2020-03-03 NOTE — Progress Notes (Signed)
Patient here for oncology follow-up appointment, Has some bruises on arm he says comes and goes, otherwise expresses no complaints or concerns at this time.

## 2020-03-03 NOTE — Progress Notes (Signed)
Hematology/Oncology note [facility] [phone] [phone]   Patient Care Team: Adin Hector, MD as PCP - General (Internal Medicine)  REFERRING PROVIDER: Adin Hector, MD  CHIEF COMPLAINTS/REASON FOR VISIT:  Establish care for hemochromatosis management  HISTORY OF PRESENTING ILLNESS:  Andrew Walsh is a 79 y.o. male who was seen in consultation at the request of Adin Hector, MD for evaluation of elevated ferritin level/hemochromatosis management. Patient reports a history  hereditary hemochromatosis.  He was diagnosed in 42s by his previous primary care provider in [place] and he has been an active blood donor every 8 to 10 weeks for many years.  Recently he was diagnosed with prostate cancer Gleason score 4+3 with a PSA of 9.4 and underwent IMRT radiation by Dr. Baruch Gouty and he finished in September 2020. He is mildly anemic and also cannot further donate blood due to recent radiation and was referred to me for management of hemochromatosis and iron overload.   Context Shortness of breath: Patient reports mild shortness of breath after exertion recently and he attributes to Covid pandemic and not able to be physically active lately. Fatigue: denies He had blood work done at [place] [facility] primary care provider's office recently. 08/19/2019, hemoglobin 14, hematocrit 40.7, MCV 105.7. Iron 254, ferritin 514,  07/18/2019, iron 259, ferritin 476, transferrin 193.5, TIBC 270, iron saturation 96  INTERVAL HISTORY Andrew Walsh is a 79 y.o. male who has above history reviewed by me today presents for follow up visit for management of iron overload/hemochromatosis. Problems and complaints are listed below: Patient continues to drink alcohol, 2-3 glasses of wine.  He has no new complaints.    Review of Systems  Constitutional: Negative for appetite change, chills, fatigue, fever and unexpected weight change.  HENT:    Negative for hearing loss and voice change.   Eyes: Negative for eye problems and icterus.  Respiratory: Negative for chest tightness, cough and shortness of breath.   Cardiovascular: Negative for chest pain and leg swelling.  Gastrointestinal: Negative for abdominal distention and abdominal pain.  Endocrine: Negative for hot flashes.  Genitourinary: Negative for difficulty urinating, dysuria and frequency.   Musculoskeletal: Negative for arthralgias.  Skin: Negative for itching and rash.  Neurological: Negative for light-headedness and numbness.  Hematological: Negative for adenopathy. Does not bruise/bleed easily.  Psychiatric/Behavioral: Negative for confusion.    MEDICAL HISTORY:  Past Medical History:  Diagnosis Date  . Ankle arthropathy 02/24/2014  . Arthritis of foot, degenerative 10/03/2012   Overview:  Subtalar arthritis   . Cancer ([place])   . Hereditary hemochromatosis ([place]) 11/03/2015   Overview:  Treated by regular blood donation   . History of kidney stones   . HLD (hyperlipidemia) 11/03/2015  . Localized, primary osteoarthritis of ankle or foot 02/24/2014    SURGICAL HISTORY: Past Surgical History:  Procedure Laterality Date  . ANKLE FUSION Right 2001, 2016  . REVISION TOTAL HIP ARTHROPLASTY  2006   Right 2011, Left 2006    SOCIAL HISTORY: Social History   Socioeconomic History  . Marital status: Married    Spouse name: Not on file  . Number of children: Not on file  . Years of education: Not on file  . Highest education level: Not on file  Occupational History  . Not on file  Tobacco Use  . Smoking status: Former Smoker    Years: 20.00    Types: Cigarettes    Quit date: 09/10/1979  Years since quitting: 40.5  . Smokeless tobacco: Never Used  Substance and Sexual Activity  . Alcohol use: Yes    Alcohol/week: 0.0 standard drinks  . Drug use: No  . Sexual activity: Not on file  Other Topics Concern  . Not on file  Social History Narrative  . Not on  file   Social Determinants of Health   Financial Resource Strain:   . Difficulty of Paying Living Expenses:   Food Insecurity:   . Worried About Charity fundraiser in the Last Year:   . Arboriculturist in the Last Year:   Transportation Needs:   . Film/video editor (Medical):   Marland Kitchen Lack of Transportation (Non-Medical):   Physical Activity:   . Days of Exercise per Week:   . Minutes of Exercise per Session:   Stress:   . Feeling of Stress :   Social Connections:   . Frequency of Communication with Friends and Family:   . Frequency of Social Gatherings with Friends and Family:   . Attends Religious Services:   . Active Member of Clubs or Organizations:   . Attends Archivist Meetings:   Marland Kitchen Marital Status:   Intimate Partner Violence:   . Fear of Current or Ex-Partner:   . Emotionally Abused:   Marland Kitchen Physically Abused:   . Sexually Abused:     FAMILY HISTORY: Family History  Problem Relation Age of Onset  . Bladder Cancer Neg Hx   . Prostate cancer Neg Hx   . Kidney cancer Neg Hx     ALLERGIES:  has No Known Allergies.  MEDICATIONS:  Current Outpatient Medications  Medication Sig Dispense Refill  . Calcium Carbonate-Vitamin D (CALCIUM-VITAMIN D3 PO) Take by mouth.    . cyanocobalamin 1000 MCG tablet Take by mouth.    . Glucosamine 500 MG CAPS Take by mouth.    . naproxen sodium (ALEVE) 220 MG tablet Take 220 mg by mouth.    . simvastatin (ZOCOR) 20 MG tablet     . vitamin B-12 (CYANOCOBALAMIN) 1000 MCG tablet Take 1 tablet (1,000 mcg total) by mouth daily. 90 tablet 1  . Multiple Vitamin (MULTI-VITAMINS) TABS Take by mouth. (Patient not taking: Reported on 03/03/2020)     No current facility-administered medications for this visit.     PHYSICAL EXAMINATION: ECOG PERFORMANCE STATUS: 1 - Symptomatic but completely ambulatory Vitals:   03/03/20 1305  BP: 123/81  Pulse: 75  Resp: 18  Temp: 98 F (36.7 C)  SpO2: 99%   Filed Weights   03/03/20 1305   Weight: 163 lb 9.6 oz (74.2 kg)    Physical Exam Constitutional:      General: He is not in acute distress. HENT:     Head: Normocephalic and atraumatic.  Eyes:     General: No scleral icterus.    Pupils: Pupils are equal, round, and reactive to light.  Cardiovascular:     Rate and Rhythm: Normal rate and regular rhythm.     Heart sounds: Normal heart sounds.  Pulmonary:     Effort: Pulmonary effort is normal. No respiratory distress.     Breath sounds: No wheezing.  Abdominal:     General: Bowel sounds are normal. There is no distension.     Palpations: Abdomen is soft. There is no mass.     Tenderness: There is no abdominal tenderness.  Musculoskeletal:        General: No swelling or deformity. Normal range of motion.  Cervical back: Normal range of motion and neck supple.  Skin:    General: Skin is warm and dry.     Findings: No erythema or rash.  Neurological:     Mental Status: He is alert and oriented to person, place, and time. Mental status is at baseline.     Cranial Nerves: No cranial nerve deficit.  Psychiatric:        Mood and Affect: Mood normal.      LABORATORY DATA:  I have reviewed the data as listed Lab Results  Component Value Date   WBC 4.7 03/01/2020   HGB 13.1 03/01/2020   HCT 37.1 (L) 03/01/2020   MCV 104.8 (H) 03/01/2020   PLT 229 03/01/2020   Recent Labs    04/02/19 0933 09/10/19 1148 03/01/20 1319  NA  --  137 138  K  --  4.2 4.4  CL  --  104 102  CO2  --  24 27  GLUCOSE  --  130* 94  BUN  --  15 12  CREATININE 0.90 0.79 0.89  CALCIUM  --  9.1 9.2  GFRNONAA  --  >60 >60  GFRAA  --  >60 >60  PROT  --  6.9 7.3  ALBUMIN  --  4.1 4.1  AST  --  38 31  ALT  --  37 29  ALKPHOS  --  53 52  BILITOT  --  0.8 0.9   Iron/TIBC/Ferritin/ %Sat    Component Value Date/Time   IRON 243 (H) 03/01/2020 1319   TIBC 308 03/01/2020 1319   FERRITIN 167 03/01/2020 1319   IRONPCTSAT 79 (H) 03/01/2020 1319      RADIOGRAPHIC STUDIES: I  have personally reviewed the radiological images as listed and agreed with the findings in the report.  No results found.   ASSESSMENT & PLAN:  1. Hereditary hemochromatosis ([place])   2. Malignant neoplasm of prostate ([place])   3. Alcohol use   4. Macrocytic anemia    #History of hereditary hemochromatosis.  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 Labs are reviewed and discussed with patient. Ferritin 79, TIBC 308, saturation of 79. Alcohol cessation was discussed with patient.  I feel that his alcohol use also contributes to the elevation of iron saturation and ferritin.  Recommend proceed with phlebotomy and IV fluid today.   #History of prostate cancer, status post radiation.  Follows up with Dr. Baruch Gouty. PSA is <0.01.   #Macrocytic anemia, 1/29/20201 normal vitamin B12 and folate level. Likely due to chronic alcohol use.  Cannot rule out underlying MDS. Advise patient to take oral vitamin b12 supplementation maintenance. Rx sent to pharmacy   Orders Placed This Encounter  Procedures  . CBC with Differential/Platelet    Standing Status:   Future    Standing Expiration Date:   03/03/2021  . Ferritin    Standing Status:   Future    Standing Expiration Date:   03/03/2021  . Iron and TIBC    Standing Status:   Future    Standing Expiration Date:   03/03/2021  . Hepatic function panel    Standing Status:   Future    Standing Expiration Date:   03/03/2021    All questions were answered. The patient knows to call the clinic with any problems questions or concerns. Return of visit: 3 months.   Earlie Server, MD, PhD Hematology Oncology [facility] at [facility] Pager- [phone] 03/03/2020

## 2020-03-22 ENCOUNTER — Ambulatory Visit: Payer: Medicare HMO | Admitting: Dermatology

## 2020-04-05 ENCOUNTER — Other Ambulatory Visit: Payer: Self-pay

## 2020-04-05 DIAGNOSIS — C61 Malignant neoplasm of prostate: Secondary | ICD-10-CM

## 2020-04-05 NOTE — Progress Notes (Signed)
PSA

## 2020-04-06 ENCOUNTER — Other Ambulatory Visit: Payer: Medicare HMO

## 2020-04-06 ENCOUNTER — Other Ambulatory Visit: Payer: Self-pay

## 2020-04-06 DIAGNOSIS — C61 Malignant neoplasm of prostate: Secondary | ICD-10-CM | POA: Diagnosis not present

## 2020-04-07 LAB — PSA: Prostate Specific Ag, Serum: <0.1 ng/mL (ref 0.0–4.0)

## 2020-04-07 NOTE — Progress Notes (Signed)
04/08/2020 10:05 AM   Andrew Walsh Jul 03, 1941 [id number]  Referring provider: Adin Hector, MD [facility] [place],  [place] [address] Chief Complaint  Patient presents with  . Follow-up    HPI: Andrew Walsh is a 79 y.o. male with a personal history of unfavorable intermediate risk prostate cancer status post fusion biopsy 02/2019 now status post EBRT with Lupron x6 months. Patient returns today for a 6 month follow up.    In terms of urinary symptoms, he has had no significant issues.  Patient reports mild urgency and frequency. No incontinence.   His fecal incontinence is persistent, he has weekly onset diarrhea. He has been keeping a food diary to see if certain food trigger bowels. He is now lactose free. Reports constant diarrhea has effected his quality of life and he is worried to go out in public. He was seen in GI but was not impressed with their recommendations.   PSA trend:  12/01/2015: 4.9 06/02/2016: 5.5 06/01/2017: 6.9 12/26/2018: 9.4 09/10/2019: 0.01 10/13/2019: 0.01 03/01/2020: <0.01 04/06/2020: <0.1    IPSS    Row Name 04/08/20 1000         International Prostate Symptom Score   How often have you had the sensation of not emptying your bladder? Not at All     How often have you had to urinate less than every two hours? Less than half the time     How often have you found you stopped and started again several times when you urinated? Less than 1 in 5 times     How often have you found it difficult to postpone urination? More than half the time     How often have you had a weak urinary stream? More than half the time     How often have you had to strain to start urination? Not at All     How many times did you typically get up at night to urinate? 2 Times     Total IPSS Score 13       Quality of Life due to urinary symptoms   If you were to spend the rest of your life with your urinary condition just the way it  is now how would you feel about that? Mixed            Score:  1-7 Mild 8-19 Moderate 20-35 Severe  PMH: Past Medical History:  Diagnosis Date  . Actinic keratosis 01/31/2016   Left lateral neck post auricular. AK and prurigo nodularis  . Actinic keratosis 01/31/2016   Left lateral neck post auricular, inferior. AK and prurigo nodularis  . Actinic keratosis 12/19/2017   Left ear anti-helix  . Ankle arthropathy 02/24/2014  . Arthritis of foot, degenerative 10/03/2012   Overview:  Subtalar arthritis   . Basal cell carcinoma 04/30/2008   Left mid dorsum nose.  . Basal cell carcinoma 07/12/2009   Left post. deltoid sup. lat. edge of cancer scar.   . Cancer ([place])   . Hereditary hemochromatosis ([place]) 11/03/2015   Overview:  Treated by regular blood donation   . History of kidney stones   . HLD (hyperlipidemia) 11/03/2015  . Localized, primary osteoarthritis of ankle or foot 02/24/2014  . Squamous cell carcinoma of skin 05/17/2010   Left forearm. KA-like pattern. EDC.  Marland Kitchen Squamous cell carcinoma of skin 12/18/2016   Right mid lat. volar forearm.WD SCC. EDC  . Squamous cell carcinoma of skin 01/22/2019  Right lateral distal tricep near elbow.   . Squamous cell carcinoma of skin 07/14/2019   Right forearm. WD SCC. EDC  . Squamous cell carcinoma of skin 09/29/2019   Right proximal mandible. SCCis    Surgical History: Past Surgical History:  Procedure Laterality Date  . ANKLE FUSION Right 2001, 2016  . REVISION TOTAL HIP ARTHROPLASTY  2006   Right 2011, Left 2006    Home Medications:  Allergies as of 04/08/2020   No Known Allergies     Medication List       Accurate as of April 08, 2020 10:05 AM. If you have any questions, ask your nurse or doctor.        CALCIUM-VITAMIN D3 PO Take by mouth.   Glucosamine 500 MG Caps Take by mouth.   Multi-Vitamins Tabs Take by mouth.   naproxen sodium 220 MG tablet Commonly known as: ALEVE Take 220 mg by mouth.     simvastatin 20 MG tablet Commonly known as: ZOCOR   vitamin B-12 1000 MCG tablet Commonly known as: CYANOCOBALAMIN Take 1 tablet (1,000 mcg total) by mouth daily.   cyanocobalamin 1000 MCG tablet Take by mouth.       Allergies: No Known Allergies  Family History: Family History  Problem Relation Age of Onset  . Bladder Cancer Neg Hx   . Prostate cancer Neg Hx   . Kidney cancer Neg Hx     Social History:  reports that he quit smoking about 40 years ago. His smoking use included cigarettes. He quit after 20.00 years of use. He has never used smokeless tobacco. He reports current alcohol use. He reports that he does not use drugs.   Physical Exam: BP (!) 174/94 (BP Location: Left Arm, Patient Position: Sitting, Cuff Size: Normal)   Pulse 68   Ht 5\' 8"  (1.727 m)   Wt 164 lb (74.4 kg)   BMI 24.94 kg/m   Constitutional:  Alert and oriented, No acute distress. HEENT: [place] AT, moist mucus membranes.  Trachea midline, no masses. Cardiovascular: No clubbing, cyanosis, or edema. Respiratory: Normal respiratory effort, no increased work of breathing. Skin: No rashes, bruises or suspicious lesions. Neurologic: Grossly intact, no focal deficits, moving all 4 extremities. Psychiatric: Normal mood and affect.  Laboratory Data:  Lab Results  Component Value Date   CREATININE 0.89 03/01/2020     Assessment & Plan:    1. Prostate cancer ([place]) Unfavorable intermediate risk prostate cancer diagnosed 02/2019 Status post IMRT with Lupron x6 months PSA on 04/06/2020 was <0.1.  2. Urinary frequency Minimal bothersome symptoms Patient does not wish to pursue pharmaceutical options at this time. IPSS score 13/35, moderate  3. Incontinence of faces, unspecified fecal incontinence type Weekly episodes of diarrhea is more bothersome at this time. Discussed using imodium to improve symptoms and quality of life.    Return in 6 month for PSA labs only. F/u in 1 year with me PSA  prior.   [place] [address], [place], [place] [address] [phone]  I, Selena Batten, am acting as a scribe for Dr. Hollice Espy.  I have reviewed the above documentation for accuracy and completeness, and I agree with the above.   Hollice Espy, MD

## 2020-04-08 ENCOUNTER — Other Ambulatory Visit: Payer: Self-pay

## 2020-04-08 ENCOUNTER — Encounter: Payer: Self-pay | Admitting: Urology

## 2020-04-08 ENCOUNTER — Ambulatory Visit (INDEPENDENT_AMBULATORY_CARE_PROVIDER_SITE_OTHER): Payer: Medicare HMO | Admitting: Urology

## 2020-04-08 VITALS — BP 174/94 | HR 68 | Ht 68.0 in | Wt 164.0 lb

## 2020-04-08 DIAGNOSIS — R350 Frequency of micturition: Secondary | ICD-10-CM

## 2020-04-08 DIAGNOSIS — C61 Malignant neoplasm of prostate: Secondary | ICD-10-CM

## 2020-04-08 DIAGNOSIS — R159 Full incontinence of feces: Secondary | ICD-10-CM | POA: Diagnosis not present

## 2020-04-15 ENCOUNTER — Inpatient Hospital Stay: Payer: Medicare HMO | Attending: Oncology

## 2020-04-15 ENCOUNTER — Other Ambulatory Visit: Payer: Self-pay

## 2020-04-15 DIAGNOSIS — C61 Malignant neoplasm of prostate: Secondary | ICD-10-CM | POA: Diagnosis not present

## 2020-04-15 LAB — PSA: Prostatic Specific Antigen: <0.01 ng/mL (ref 0.00–4.00)

## 2020-04-22 ENCOUNTER — Ambulatory Visit: Payer: Medicare HMO | Admitting: Radiation Oncology

## 2020-04-23 ENCOUNTER — Encounter: Payer: Self-pay | Admitting: Radiation Oncology

## 2020-04-23 ENCOUNTER — Ambulatory Visit
Admission: RE | Admit: 2020-04-23 | Discharge: 2020-04-23 | Disposition: A | Discharge status: Home / Self Care | Payer: Medicare HMO | Source: Ambulatory Visit | Attending: Radiation Oncology | Admitting: Radiation Oncology

## 2020-04-23 ENCOUNTER — Other Ambulatory Visit: Payer: Self-pay

## 2020-04-23 VITALS — BP 169/90 | HR 76 | Resp 18 | Wt 163.0 lb

## 2020-04-23 DIAGNOSIS — R197 Diarrhea, unspecified: Secondary | ICD-10-CM | POA: Insufficient documentation

## 2020-04-23 DIAGNOSIS — Z923 Personal history of irradiation: Secondary | ICD-10-CM | POA: Insufficient documentation

## 2020-04-23 DIAGNOSIS — C61 Malignant neoplasm of prostate: Secondary | ICD-10-CM | POA: Diagnosis not present

## 2020-04-23 NOTE — Progress Notes (Signed)
Radiation Oncology Follow up Note  Name: Andrew Walsh   Date:   04/23/2020 MRN:  [id number] DOB: 1941/08/26    This 79 y.o. male presents to the clinic today for 5-month follow-up status post IMRT radiation therapy for stage IIb adenocarcinoma the prostate Gleason score of 7 presenting with a PSA of 9.4..  REFERRING PROVIDER: Adin Hector, MD  HPI: Patient is a 79 year old male now out 10 months having completed IMRT radiation therapy to his prostate and pelvic nodes for stage IIb (T2 cN0 M0) Gleason 7 (4+3) adenocarcinoma presenting with a PSA of 9.4 seen today in routine follow-up he is doing fairly well continues to have intermittent diarrhea does not really follow any low residue diet.  He has seen GI for that although not very helpful.  He has very little lower urinary tract symptoms has nocturia x2.  His most recent PSA is less than 0.01.Marland Kitchen  He is no longer on ADT therapy.  COMPLICATIONS OF TREATMENT: none  FOLLOW UP COMPLIANCE: keeps appointments   PHYSICAL EXAM:  BP (!) 169/90 (BP Location: Left Arm, Patient Position: Sitting)   Pulse 76   Resp 18   Wt 163 lb (73.9 kg)   BMI 24.78 kg/m  Well-developed well-nourished patient in NAD. HEENT reveals PERLA, EOMI, discs not visualized.  Oral cavity is clear. No oral mucosal lesions are identified. Neck is clear without evidence of cervical or supraclavicular adenopathy. Lungs are clear to A&P. Cardiac examination is essentially unremarkable with regular rate and rhythm without murmur rub or thrill. Abdomen is benign with no organomegaly or masses noted. Motor sensory and DTR levels are equal and symmetric in the upper and lower extremities. Cranial nerves II through XII are grossly intact. Proprioception is intact. No peripheral adenopathy or edema is identified. No motor or sensory levels are noted. Crude visual fields are within normal range.  RADIOLOGY RESULTS: No current films to review  PLAN: Present time patient is doing  well under excellent biochemical control of his prostate cancer.  And pleased with his overall progress.  I have suggested Activia yogurt to help with some of his intermittent diarrhea.  Otherwise and pleased with his overall progress I think I can go to once year follow-up appointments with PSA at that time.  Patient is to call with any concerns.  I would like to take this opportunity to thank you for allowing me to participate in the care of your patient.Noreene Filbert, MD

## 2020-06-04 ENCOUNTER — Inpatient Hospital Stay: Payer: Medicare HMO

## 2020-06-07 ENCOUNTER — Inpatient Hospital Stay: Payer: Medicare HMO | Admitting: Oncology

## 2020-06-07 ENCOUNTER — Inpatient Hospital Stay: Payer: Medicare HMO

## 2020-06-16 ENCOUNTER — Ambulatory Visit: Payer: Medicare HMO | Admitting: Dermatology

## 2020-06-16 ENCOUNTER — Other Ambulatory Visit: Payer: Self-pay

## 2020-06-16 DIAGNOSIS — C44229 Squamous cell carcinoma of skin of left ear and external auricular canal: Secondary | ICD-10-CM | POA: Diagnosis not present

## 2020-06-16 DIAGNOSIS — D229 Melanocytic nevi, unspecified: Secondary | ICD-10-CM | POA: Diagnosis not present

## 2020-06-16 DIAGNOSIS — D1800 Hemangioma unspecified site: Secondary | ICD-10-CM | POA: Diagnosis not present

## 2020-06-16 DIAGNOSIS — L814 Other melanin hyperpigmentation: Secondary | ICD-10-CM

## 2020-06-16 DIAGNOSIS — L570 Actinic keratosis: Secondary | ICD-10-CM | POA: Diagnosis not present

## 2020-06-16 DIAGNOSIS — D485 Neoplasm of uncertain behavior of skin: Secondary | ICD-10-CM | POA: Diagnosis not present

## 2020-06-16 DIAGNOSIS — Z1283 Encounter for screening for malignant neoplasm of skin: Secondary | ICD-10-CM

## 2020-06-16 DIAGNOSIS — L820 Inflamed seborrheic keratosis: Secondary | ICD-10-CM | POA: Diagnosis not present

## 2020-06-16 DIAGNOSIS — D492 Neoplasm of unspecified behavior of bone, soft tissue, and skin: Secondary | ICD-10-CM

## 2020-06-16 DIAGNOSIS — Z85828 Personal history of other malignant neoplasm of skin: Secondary | ICD-10-CM

## 2020-06-16 DIAGNOSIS — L821 Other seborrheic keratosis: Secondary | ICD-10-CM

## 2020-06-16 DIAGNOSIS — L578 Other skin changes due to chronic exposure to nonionizing radiation: Secondary | ICD-10-CM

## 2020-06-16 DIAGNOSIS — D692 Other nonthrombocytopenic purpura: Secondary | ICD-10-CM

## 2020-06-16 NOTE — Patient Instructions (Signed)

## 2020-06-16 NOTE — Progress Notes (Signed)
Follow-Up Visit   Subjective  Andrew Walsh is a 79 y.o. male who presents for the following: Annual Exam (Pt presents for TBSE, hx of skin cancer, hx of Aks ). Pt concerned about red patch on his L ear that will not go away.  The patient presents for Total-Body Skin Exam (TBSE) for skin cancer screening and mole check.    The following portions of the chart were reviewed this encounter and updated as appropriate:  Tobacco  Allergies  Meds  Problems  Med Hx  Surg Hx  Fam Hx     Review of Systems:  No other skin or systemic complaints except as noted in HPI or Assessment and Plan.  Objective  Well appearing patient in no apparent distress; mood and affect are within normal limits.  A full examination was performed including scalp, head, eyes, ears, nose, lips, neck, chest, axillae, abdomen, back, buttocks, bilateral upper extremities, bilateral lower extremities, hands, feet, fingers, toes, fingernails, and toenails. All findings within normal limits unless otherwise noted below.  Objective  multiple see history: Well healed scar with no evidence of recurrence.   Objective  multiple see history: Well healed scar with no evidence of recurrence.   Objective  L ear sup helix: 0.6 cm crusted papule        Objective  trunk (8): Erythematous thin papules/macules with gritty scale.   Objective  trunk (17): Erythematous keratotic or waxy stuck-on papule or plaque.    Assessment & Plan  History of SCC (squamous cell carcinoma) of skin multiple see history  Clear. Observe for recurrence. Call clinic for new or changing lesions.  Recommend regular skin exams, daily broad-spectrum spf 30+ sunscreen use, and photoprotection.     History of basal cell carcinoma (BCC) multiple see history  Clear. Observe for recurrence. Call clinic for new or changing lesions.  Recommend regular skin exams, daily broad-spectrum spf 30+ sunscreen use, and photoprotection.     Neoplasm  of skin L ear sup helix  Skin / nail biopsy Type of biopsy: tangential   Informed consent: discussed and consent obtained   Patient was prepped and draped in usual sterile fashion: area prepped with alochol. Anesthesia: the lesion was anesthetized in a standard fashion   Anesthetic:  1% lidocaine w/ epinephrine 1-100,000 buffered w/ 8.4% NaHCO3 Instrument used: flexible razor blade   Hemostasis achieved with: pressure, aluminum chloride and electrodesiccation   Outcome: patient tolerated procedure well   Post-procedure details: wound care instructions given   Post-procedure details comment:  Ointment and small bandage  Specimen 1 - Surgical pathology Differential Diagnosis: R/O BCC Check Margins: No 0.6 cm crusted papule  AK (actinic keratosis) (8) trunk  Destruction of lesion - trunk Complexity: simple   Destruction method: cryotherapy   Informed consent: discussed and consent obtained   Timeout:  patient name, date of birth, surgical site, and procedure verified Lesion destroyed using liquid nitrogen: Yes   Region frozen until ice ball extended beyond lesion: Yes   Outcome: patient tolerated procedure well with no complications   Post-procedure details: wound care instructions given    Inflamed seborrheic keratosis (17) trunk  Destruction of lesion - trunk Complexity: simple   Destruction method: cryotherapy   Informed consent: discussed and consent obtained   Timeout:  patient name, date of birth, surgical site, and procedure verified Lesion destroyed using liquid nitrogen: Yes   Region frozen until ice ball extended beyond lesion: Yes   Outcome: patient tolerated procedure well with no  complications   Post-procedure details: wound care instructions given     Lentigines - Scattered tan macules - Discussed due to sun exposure - Benign, observe - Call for any changes  Seborrheic Keratoses - Stuck-on, waxy, tan-brown papules and plaques  - Discussed benign  etiology and prognosis. - Observe - Call for any changes  Melanocytic Nevi - Tan-brown and/or pink-flesh-colored symmetric macules and papules - Benign appearing on exam today - Observation - Call clinic for new or changing moles - Recommend daily use of broad spectrum spf 30+ sunscreen to sun-exposed areas.   Hemangiomas - Red papules - Discussed benign nature - Observe - Call for any changes  Actinic Damage - diffuse scaly erythematous macules with underlying dyspigmentation - Recommend daily broad spectrum sunscreen SPF 30+ to sun-exposed areas, reapply every 2 hours as needed.  - Call for new or changing lesions.  Purpura/Stasis changes  Lower legs - Violaceous macules and patches - Benign - Related to age, sun damage and/or use of blood thinners - Observe - Can use OTC arnica containing moisturizer such as Dermend Bruise Formula if desired - Call for worsening or other concerns  Skin cancer screening performed today.  Return in about 6 months (around 12/15/2020).  IMarye Round, CMA, am acting as scribe for Sarina Ser, MD .  Documentation: I have reviewed the above documentation for accuracy and completeness, and I agree with the above.  Sarina Ser, MD

## 2020-06-17 ENCOUNTER — Encounter: Payer: Self-pay | Admitting: Dermatology

## 2020-06-21 ENCOUNTER — Telehealth: Payer: Self-pay

## 2020-06-21 NOTE — Telephone Encounter (Signed)
Discussed biopsy results with pt  °

## 2020-06-21 NOTE — Telephone Encounter (Signed)
-----   Message from Ralene Bathe, MD sent at 06/18/2020  5:14 PM EDT -----   Diagnosis Skin , left ear sup helix WELL DIFFERENTIATED SQUAMOUS CELL CARCINOMA WITH SUPERFICIAL INFILTRATION, INFLAMED  Cancer - SCC Schedule for surgery

## 2020-06-22 ENCOUNTER — Other Ambulatory Visit: Payer: Self-pay

## 2020-06-22 ENCOUNTER — Inpatient Hospital Stay: Payer: Medicare HMO | Attending: Oncology

## 2020-06-22 DIAGNOSIS — Z87442 Personal history of urinary calculi: Secondary | ICD-10-CM | POA: Diagnosis not present

## 2020-06-22 DIAGNOSIS — Z85828 Personal history of other malignant neoplasm of skin: Secondary | ICD-10-CM | POA: Insufficient documentation

## 2020-06-22 DIAGNOSIS — Z87891 Personal history of nicotine dependence: Secondary | ICD-10-CM | POA: Diagnosis not present

## 2020-06-22 DIAGNOSIS — E785 Hyperlipidemia, unspecified: Secondary | ICD-10-CM | POA: Insufficient documentation

## 2020-06-22 DIAGNOSIS — Z923 Personal history of irradiation: Secondary | ICD-10-CM | POA: Insufficient documentation

## 2020-06-22 DIAGNOSIS — D7589 Other specified diseases of blood and blood-forming organs: Secondary | ICD-10-CM | POA: Insufficient documentation

## 2020-06-22 DIAGNOSIS — Z79899 Other long term (current) drug therapy: Secondary | ICD-10-CM | POA: Diagnosis not present

## 2020-06-22 DIAGNOSIS — D649 Anemia, unspecified: Secondary | ICD-10-CM | POA: Diagnosis not present

## 2020-06-22 DIAGNOSIS — C61 Malignant neoplasm of prostate: Secondary | ICD-10-CM | POA: Diagnosis not present

## 2020-06-22 LAB — CBC WITH DIFFERENTIAL/PLATELET
Abs Immature Granulocytes: 0.02 10*3/uL (ref 0.00–0.07)
Basophils Absolute: 0 10*3/uL (ref 0.0–0.1)
Basophils Relative: 1 %
Eosinophils Absolute: 0.1 10*3/uL (ref 0.0–0.5)
Eosinophils Relative: 2 %
HCT: 40 % (ref 39.0–52.0)
Hemoglobin: 14 g/dL (ref 13.0–17.0)
Immature Granulocytes: 0 %
Lymphocytes Relative: 27 %
Lymphs Abs: 1.3 10*3/uL (ref 0.7–4.0)
MCH: 36.2 pg — ABNORMAL HIGH (ref 26.0–34.0)
MCHC: 35 g/dL (ref 30.0–36.0)
MCV: 103.4 fL — ABNORMAL HIGH (ref 80.0–100.0)
Monocytes Absolute: 0.5 10*3/uL (ref 0.1–1.0)
Monocytes Relative: 10 %
Neutro Abs: 2.9 10*3/uL (ref 1.7–7.7)
Neutrophils Relative %: 60 %
Platelets: 204 10*3/uL (ref 150–400)
RBC: 3.87 MIL/uL — ABNORMAL LOW (ref 4.22–5.81)
RDW: 12.6 % (ref 11.5–15.5)
WBC: 4.9 10*3/uL (ref 4.0–10.5)
nRBC: 0 % (ref 0.0–0.2)

## 2020-06-22 LAB — IRON AND TIBC
Iron: 255 ug/dL — ABNORMAL HIGH (ref 45–182)
Saturation Ratios: 84 % — ABNORMAL HIGH (ref 17.9–39.5)
TIBC: 304 ug/dL (ref 250–450)
UIBC: 49 ug/dL

## 2020-06-22 LAB — HEPATIC FUNCTION PANEL
ALT: 28 U/L (ref 0–44)
AST: 32 U/L (ref 15–41)
Albumin: 4.2 g/dL (ref 3.5–5.0)
Alkaline Phosphatase: 44 U/L (ref 38–126)
Bilirubin, Direct: 0.2 mg/dL (ref 0.0–0.2)
Indirect Bilirubin: 0.9 mg/dL (ref 0.3–0.9)
Total Bilirubin: 1.1 mg/dL (ref 0.3–1.2)
Total Protein: 7.2 g/dL (ref 6.5–8.1)

## 2020-06-22 LAB — FERRITIN: Ferritin: 227 ng/mL (ref 24–336)

## 2020-06-24 ENCOUNTER — Inpatient Hospital Stay: Payer: Medicare HMO

## 2020-06-24 ENCOUNTER — Encounter: Payer: Self-pay | Admitting: Oncology

## 2020-06-24 ENCOUNTER — Other Ambulatory Visit: Payer: Medicare HMO

## 2020-06-24 ENCOUNTER — Other Ambulatory Visit: Payer: Self-pay

## 2020-06-24 ENCOUNTER — Inpatient Hospital Stay: Payer: Medicare HMO | Admitting: Oncology

## 2020-06-24 DIAGNOSIS — Z789 Other specified health status: Secondary | ICD-10-CM

## 2020-06-24 DIAGNOSIS — Z85828 Personal history of other malignant neoplasm of skin: Secondary | ICD-10-CM | POA: Diagnosis not present

## 2020-06-24 DIAGNOSIS — D7589 Other specified diseases of blood and blood-forming organs: Secondary | ICD-10-CM

## 2020-06-24 DIAGNOSIS — C61 Malignant neoplasm of prostate: Secondary | ICD-10-CM | POA: Diagnosis not present

## 2020-06-24 DIAGNOSIS — D649 Anemia, unspecified: Secondary | ICD-10-CM | POA: Diagnosis not present

## 2020-06-24 DIAGNOSIS — Z87891 Personal history of nicotine dependence: Secondary | ICD-10-CM | POA: Diagnosis not present

## 2020-06-24 DIAGNOSIS — Z7289 Other problems related to lifestyle: Secondary | ICD-10-CM | POA: Diagnosis not present

## 2020-06-24 DIAGNOSIS — Z87442 Personal history of urinary calculi: Secondary | ICD-10-CM | POA: Diagnosis not present

## 2020-06-24 DIAGNOSIS — Z923 Personal history of irradiation: Secondary | ICD-10-CM | POA: Diagnosis not present

## 2020-06-24 DIAGNOSIS — E785 Hyperlipidemia, unspecified: Secondary | ICD-10-CM | POA: Diagnosis not present

## 2020-06-24 MED ORDER — SODIUM CHLORIDE 0.9 % IV SOLN
Freq: Once | INTRAVENOUS | Status: AC
  Filled 2020-06-24: qty 250

## 2020-06-24 NOTE — Progress Notes (Signed)
OK for patient's post-phlebotomy 577ml NS to infuse over 96ml/hr per Dr. Tasia Catchings.

## 2020-06-24 NOTE — Progress Notes (Signed)
Hematology/Oncology note [facility] Telephone:([phone] [phone]   Patient Care Team: Adin Hector, MD as PCP - General (Internal Medicine)  REFERRING PROVIDER: Adin Hector, MD  CHIEF COMPLAINTS/REASON FOR VISIT:  Establish care for hemochromatosis management  HISTORY OF PRESENTING ILLNESS:  Andrew Walsh is a 79 y.o. male who was seen in consultation at the request of Adin Hector, MD for evaluation of elevated ferritin level/hemochromatosis management. Patient reports a history  hereditary hemochromatosis.  He was diagnosed in 54s by his previous primary care provider in [place] and he has been an active blood donor every 8 to 10 weeks for many years.  Recently he was diagnosed with prostate cancer Gleason score 4+3 with a PSA of 9.4 and underwent IMRT radiation by Dr. Baruch Gouty and he finished in September 2020. He is mildly anemic and also cannot further donate blood due to recent radiation and was referred to me for management of hemochromatosis and iron overload.   Context Shortness of breath: Patient reports mild shortness of breath after exertion recently and he attributes to Covid pandemic and not able to be physically active lately. Fatigue: denies He had blood work done at [facility] primary care provider's office recently. 08/19/2019, hemoglobin 14, hematocrit 40.7, MCV 105.7. Iron 254, ferritin 514,  07/18/2019, iron 259, ferritin 476, transferrin 193.5, TIBC 270, iron saturation 96  INTERVAL HISTORY BRONC BROSSEAU is a 79 y.o. male who has above history reviewed by me today presents for follow up visit for management of iron overload/hemochromatosis. Problems and complaints are listed below: Patient continues to drink alcohol, 3-4 glasses of wine.  He has no new complaints.    Review of Systems  Constitutional: Negative for appetite change, chills, fatigue, fever and unexpected weight change.  HENT:    Negative for hearing loss and voice change.   Eyes: Negative for eye problems and icterus.  Respiratory: Negative for chest tightness, cough and shortness of breath.   Cardiovascular: Negative for chest pain and leg swelling.  Gastrointestinal: Negative for abdominal distention and abdominal pain.  Endocrine: Negative for hot flashes.  Genitourinary: Negative for difficulty urinating, dysuria and frequency.   Musculoskeletal: Negative for arthralgias.  Skin: Negative for itching and rash.  Neurological: Negative for light-headedness and numbness.  Hematological: Negative for adenopathy. Does not bruise/bleed easily.  Psychiatric/Behavioral: Negative for confusion.    MEDICAL HISTORY:  Past Medical History:  Diagnosis Date  . Actinic keratosis 01/31/2016   Left lateral neck post auricular. AK and prurigo nodularis  . Actinic keratosis 01/31/2016   Left lateral neck post auricular, inferior. AK and prurigo nodularis  . Actinic keratosis 12/19/2017   Left ear anti-helix  . Ankle arthropathy 02/24/2014  . Arthritis of foot, degenerative 10/03/2012   Overview:  Subtalar arthritis   . Basal cell carcinoma 04/30/2008   Left mid dorsum nose.  . Basal cell carcinoma 07/12/2009   Left post. deltoid sup. lat. edge of cancer scar.   . Cancer ([place])   . Hereditary hemochromatosis ([place]) 11/03/2015   Overview:  Treated by regular blood donation   . History of kidney stones   . HLD (hyperlipidemia) 11/03/2015  . Localized, primary osteoarthritis of ankle or foot 02/24/2014  . Squamous cell carcinoma of skin 05/17/2010   Left forearm. KA-like pattern. ED[facility].  Marland Kitchen Squamous cell carcinoma of skin 12/18/2016   Right mid lat. volar forearm.WD SCC. EDC  . Squamous cell carcinoma of skin 01/22/2019   Right  lateral distal tricep near elbow.   . Squamous cell carcinoma of skin 07/14/2019   Right forearm. WD SCC. EDC  . Squamous cell carcinoma of skin 09/29/2019   Right proximal mandible. SCCis     SURGICAL HISTORY: Past Surgical History:  Procedure Laterality Date  . ANKLE FUSION Right 2001, 2016  . REVISION TOTAL HIP ARTHROPLASTY  2006   Right 2011, Left 2006    SOCIAL HISTORY: Social History   Socioeconomic History  . Marital status: Married    Spouse name: Not on file  . Number of children: Not on file  . Years of education: Not on file  . Highest education level: Not on file  Occupational History  . Not on file  Tobacco Use  . Smoking status: Former Smoker    Years: 20.00    Types: Cigarettes    Quit date: 09/10/1979    Years since quitting: 40.8  . Smokeless tobacco: Never Used  Substance and Sexual Activity  . Alcohol use: Yes    Alcohol/week: 0.0 standard drinks  . Drug use: No  . Sexual activity: Not on file  Other Topics Concern  . Not on file  Social History Narrative  . Not on file   Social Determinants of Health   Financial Resource Strain:   . Difficulty of Paying Living Expenses: Not on file  Food Insecurity:   . Worried About Charity fundraiser in the Last Year: Not on file  . Ran Out of Food in the Last Year: Not on file  Transportation Needs:   . Lack of Transportation (Medical): Not on file  . Lack of Transportation (Non-Medical): Not on file  Physical Activity:   . Days of Exercise per Week: Not on file  . Minutes of Exercise per Session: Not on file  Stress:   . Feeling of Stress : Not on file  Social Connections:   . Frequency of Communication with Friends and Family: Not on file  . Frequency of Social Gatherings with Friends and Family: Not on file  . Attends Religious Services: Not on file  . Active Member of Clubs or Organizations: Not on file  . Attends Archivist Meetings: Not on file  . Marital Status: Not on file  Intimate Partner Violence:   . Fear of Current or Ex-Partner: Not on file  . Emotionally Abused: Not on file  . Physically Abused: Not on file  . Sexually Abused: Not on file    FAMILY  HISTORY: Family History  Problem Relation Age of Onset  . Bladder Cancer Neg Hx   . Prostate cancer Neg Hx   . Kidney cancer Neg Hx     ALLERGIES:  has No Known Allergies.  MEDICATIONS:  Current Outpatient Medications  Medication Sig Dispense Refill  . Calcium Carbonate-Vitamin D (CALCIUM-VITAMIN D3 PO) Take by mouth.    . Glucosamine 500 MG CAPS Take by mouth.    . Multiple Vitamin (MULTI-VITAMINS) TABS Take by mouth.     . naproxen sodium (ALEVE) 220 MG tablet Take 220 mg by mouth.    . simvastatin (ZOCOR) 20 MG tablet     . vitamin B-12 (CYANOCOBALAMIN) 1000 MCG tablet Take 1 tablet (1,000 mcg total) by mouth daily. 90 tablet 1   No current facility-administered medications for this visit.     PHYSICAL EXAMINATION: ECOG PERFORMANCE STATUS: 1 - Symptomatic but completely ambulatory Vitals:   06/24/20 1108  BP: (!) 140/91  Pulse: 88  Resp: 16  Temp: (!)  97.5 F (36.4 C)   Filed Weights   06/24/20 1108  Weight: 165 lb 6.4 oz (75 kg)    Physical Exam Constitutional:      General: He is not in acute distress. HENT:     Head: Normocephalic and atraumatic.  Eyes:     General: No scleral icterus.    Pupils: Pupils are equal, round, and reactive to light.  Cardiovascular:     Rate and Rhythm: Normal rate and regular rhythm.     Heart sounds: Normal heart sounds.  Pulmonary:     Effort: Pulmonary effort is normal. No respiratory distress.     Breath sounds: No wheezing.  Abdominal:     General: Bowel sounds are normal. There is no distension.     Palpations: Abdomen is soft. There is no mass.     Tenderness: There is no abdominal tenderness.  Musculoskeletal:        General: No swelling or deformity. Normal range of motion.     Cervical back: Normal range of motion and neck supple.  Skin:    General: Skin is warm and dry.     Findings: No erythema or rash.  Neurological:     Mental Status: He is alert and oriented to person, place, and time. Mental status is  at baseline.     Cranial Nerves: No cranial nerve deficit.  Psychiatric:        Mood and Affect: Mood normal.      LABORATORY DATA:  I have reviewed the data as listed Lab Results  Component Value Date   WBC 4.9 06/22/2020   HGB 14.0 06/22/2020   HCT 40.0 06/22/2020   MCV 103.4 (H) 06/22/2020   PLT 204 06/22/2020   Recent Labs    09/10/19 1148 03/01/20 1319 06/22/20 1102  NA 137 138  --   K 4.2 4.4  --   CL 104 102  --   CO2 24 27  --   GLUCOSE 130* 94  --   BUN 15 12  --   CREATININE 0.79 0.89  --   CALCIUM 9.1 9.2  --   GFRNONAA >60 >60  --   GFRAA >60 >60  --   PROT 6.9 7.3 7.2  ALBUMIN 4.1 4.1 4.2  AST 38 31 32  ALT 37 29 28  ALKPHOS 53 52 44  BILITOT 0.8 0.9 1.1  BILIDIR  --   --  0.2  IBILI  --   --  0.9   Iron/TIBC/Ferritin/ %Sat    Component Value Date/Time   IRON 255 (H) 06/22/2020 1102   TIBC 304 06/22/2020 1102   FERRITIN 227 06/22/2020 1102   IRONPCTSAT 84 (H) 06/22/2020 1102      RADIOGRAPHIC STUDIES: I have personally reviewed the radiological images as listed and agreed with the findings in the report.  No results found.   ASSESSMENT & PLAN:  1. Hereditary hemochromatosis ([place])   2. Malignant neoplasm of prostate ([place])   3. Alcohol use   4. Macrocytosis    #History of hereditary hemochromatosis.  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 Labs are reviewed and discussed with patient.  Ferritin is 227, iron saturation 84. I had a lengthy discussion with the patient that iron panel can be falsely elevated due to chronic alcohol use. The current iron panel may not truly reflect his iron level. Ideally for homozygous hemochromatosis patients, recommend phlebotomy to keep ferritin less than 100, preferably less than 50.  Iron saturation  less than 45.  The in proportional increase of iron saturation may be secondary to alcohol use. Proceed with phlebotomy 500 cc x 1 today.  Patient will get IV fluid 500 cc. Repeat H&H, iron  TIBC ferritin in 6 weeks +/- phlebotomy. Follow-up with me in 3 months.  #History of prostate cancer, status post radiation.  Follows up with Dr. Baruch Gouty. 04/15/2020 PSA is <0.01.  #Alcohol cessation was discussed with patient. #Macrocytosis 1/29/20201 normal vitamin B12 and folate level. This is likely secondary to chronic alcohol use.  Advised patient to continue oral vitamin B12 supplementation .  Orders Placed This Encounter  Procedures  . Hemoglobin and hematocrit, blood    Standing Status:   Future    Standing Expiration Date:   06/24/2021  . Ferritin    Standing Status:   Future    Standing Expiration Date:   06/24/2021  . Iron and TIBC    Standing Status:   Future    Standing Expiration Date:   06/24/2021  . CBC with Differential/Platelet    Standing Status:   Future    Standing Expiration Date:   06/24/2021  . Hepatic function panel    Standing Status:   Future    Standing Expiration Date:   06/24/2021  . Iron and TIBC    Standing Status:   Future    Standing Expiration Date:   06/24/2021  . Ferritin    Standing Status:   Future    Standing Expiration Date:   06/24/2021    All questions were answered. The patient knows to call the clinic with any problems questions or concerns. Return of visit: 3 months.   Earlie Server, MD, PhD Hematology Oncology [facility] at [facility] Pager- [phone] 06/24/2020

## 2020-06-24 NOTE — Progress Notes (Signed)
Patient denies new problems/concerns today.   °

## 2020-07-01 DIAGNOSIS — R739 Hyperglycemia, unspecified: Secondary | ICD-10-CM | POA: Diagnosis not present

## 2020-07-01 DIAGNOSIS — E785 Hyperlipidemia, unspecified: Secondary | ICD-10-CM | POA: Diagnosis not present

## 2020-07-01 DIAGNOSIS — C61 Malignant neoplasm of prostate: Secondary | ICD-10-CM | POA: Diagnosis not present

## 2020-07-08 DIAGNOSIS — M25511 Pain in right shoulder: Secondary | ICD-10-CM | POA: Diagnosis not present

## 2020-07-08 DIAGNOSIS — Z8546 Personal history of malignant neoplasm of prostate: Secondary | ICD-10-CM | POA: Diagnosis not present

## 2020-07-08 DIAGNOSIS — R159 Full incontinence of feces: Secondary | ICD-10-CM | POA: Diagnosis not present

## 2020-07-08 DIAGNOSIS — M1990 Unspecified osteoarthritis, unspecified site: Secondary | ICD-10-CM | POA: Diagnosis not present

## 2020-07-12 DIAGNOSIS — G8929 Other chronic pain: Secondary | ICD-10-CM | POA: Diagnosis not present

## 2020-07-12 DIAGNOSIS — M25511 Pain in right shoulder: Secondary | ICD-10-CM | POA: Diagnosis not present

## 2020-07-12 DIAGNOSIS — R197 Diarrhea, unspecified: Secondary | ICD-10-CM | POA: Diagnosis not present

## 2020-07-12 DIAGNOSIS — Z923 Personal history of irradiation: Secondary | ICD-10-CM | POA: Diagnosis not present

## 2020-07-12 DIAGNOSIS — M19011 Primary osteoarthritis, right shoulder: Secondary | ICD-10-CM | POA: Diagnosis not present

## 2020-07-12 DIAGNOSIS — R152 Fecal urgency: Secondary | ICD-10-CM | POA: Diagnosis not present

## 2020-07-12 DIAGNOSIS — K921 Melena: Secondary | ICD-10-CM | POA: Diagnosis not present

## 2020-07-12 DIAGNOSIS — M4727 Other spondylosis with radiculopathy, lumbosacral region: Secondary | ICD-10-CM | POA: Diagnosis not present

## 2020-07-12 DIAGNOSIS — M5440 Lumbago with sciatica, unspecified side: Secondary | ICD-10-CM | POA: Diagnosis not present

## 2020-07-12 DIAGNOSIS — M5137 Other intervertebral disc degeneration, lumbosacral region: Secondary | ICD-10-CM | POA: Diagnosis not present

## 2020-07-12 DIAGNOSIS — R159 Full incontinence of feces: Secondary | ICD-10-CM | POA: Diagnosis not present

## 2020-08-02 ENCOUNTER — Other Ambulatory Visit: Payer: Self-pay

## 2020-08-02 ENCOUNTER — Inpatient Hospital Stay: Payer: Medicare HMO | Attending: Oncology

## 2020-08-02 DIAGNOSIS — K921 Melena: Secondary | ICD-10-CM | POA: Diagnosis not present

## 2020-08-02 DIAGNOSIS — R197 Diarrhea, unspecified: Secondary | ICD-10-CM | POA: Diagnosis not present

## 2020-08-02 LAB — HEMOGLOBIN AND HEMATOCRIT, BLOOD
HCT: 37.7 % — ABNORMAL LOW (ref 39.0–52.0)
Hemoglobin: 13.2 g/dL (ref 13.0–17.0)

## 2020-08-02 LAB — FERRITIN: Ferritin: 192 ng/mL (ref 24–336)

## 2020-08-02 LAB — IRON AND TIBC
Iron: 216 ug/dL — ABNORMAL HIGH (ref 45–182)
Saturation Ratios: 74 % — ABNORMAL HIGH (ref 17.9–39.5)
TIBC: 291 ug/dL (ref 250–450)
UIBC: 75 ug/dL

## 2020-08-03 ENCOUNTER — Telehealth: Payer: Self-pay

## 2020-08-03 ENCOUNTER — Inpatient Hospital Stay: Payer: Medicare HMO

## 2020-08-03 ENCOUNTER — Encounter: Payer: Self-pay | Admitting: Internal Medicine

## 2020-08-03 MED ORDER — SODIUM CHLORIDE 0.9 % IV SOLN
Freq: Once | INTRAVENOUS | Status: AC
  Filled 2020-08-03: qty 250

## 2020-08-03 NOTE — Telephone Encounter (Signed)
Pt informed. He will be coming today for phlebotomy.

## 2020-08-03 NOTE — Telephone Encounter (Signed)
Done. Pt is aware.

## 2020-08-03 NOTE — Progress Notes (Signed)
Patient for phlebotomy today. Has history of "feeling bad" after previous phlebotomies. NS 565ml started after removing 500 ml blood per order. At 193 ml patient reports he feels well and would not like to receive the rest of the IVF. VS stable. IVF stopped. Patient stable at discharge

## 2020-08-03 NOTE — Telephone Encounter (Signed)
-----   Message from Earlie Server, MD sent at 08/03/2020  1:07 AM EST ----- Please let him know that iron level still is high- proceed with phlebotomy on 11/23 and I recommend him to repeat iron tibc ferritin and H&H in 2 weeks, prior to possible H&H

## 2020-08-06 ENCOUNTER — Other Ambulatory Visit
Admission: RE | Admit: 2020-08-06 | Discharge: 2020-08-06 | Disposition: A | Discharge status: Home / Self Care | Payer: Medicare HMO | Source: Ambulatory Visit | Attending: Internal Medicine | Admitting: Internal Medicine

## 2020-08-06 ENCOUNTER — Other Ambulatory Visit: Payer: Self-pay

## 2020-08-06 DIAGNOSIS — Z01812 Encounter for preprocedural laboratory examination: Secondary | ICD-10-CM | POA: Diagnosis not present

## 2020-08-06 DIAGNOSIS — Z20822 Contact with and (suspected) exposure to covid-19: Secondary | ICD-10-CM | POA: Insufficient documentation

## 2020-08-07 LAB — SARS CORONAVIRUS 2 (TAT 6-24 HRS): SARS Coronavirus 2: NEGATIVE

## 2020-08-09 ENCOUNTER — Encounter: Payer: Self-pay | Admitting: Internal Medicine

## 2020-08-09 ENCOUNTER — Ambulatory Visit: Payer: Medicare HMO | Admitting: Certified Registered Nurse Anesthetist

## 2020-08-09 ENCOUNTER — Encounter
Admission: RE | Disposition: A | Discharge status: Home / Self Care | Payer: Self-pay | Source: Home / Self Care | Attending: Internal Medicine

## 2020-08-09 ENCOUNTER — Ambulatory Visit
Admission: RE | Admit: 2020-08-09 | Discharge: 2020-08-09 | Disposition: A | Discharge status: Home / Self Care | Payer: Medicare HMO | Attending: Internal Medicine | Admitting: Internal Medicine

## 2020-08-09 ENCOUNTER — Other Ambulatory Visit: Payer: Self-pay

## 2020-08-09 DIAGNOSIS — K591 Functional diarrhea: Secondary | ICD-10-CM | POA: Diagnosis not present

## 2020-08-09 DIAGNOSIS — K921 Melena: Secondary | ICD-10-CM | POA: Insufficient documentation

## 2020-08-09 DIAGNOSIS — K5520 Angiodysplasia of colon without hemorrhage: Secondary | ICD-10-CM | POA: Diagnosis not present

## 2020-08-09 DIAGNOSIS — Z79899 Other long term (current) drug therapy: Secondary | ICD-10-CM | POA: Insufficient documentation

## 2020-08-09 DIAGNOSIS — R159 Full incontinence of feces: Secondary | ICD-10-CM | POA: Diagnosis not present

## 2020-08-09 DIAGNOSIS — K5730 Diverticulosis of large intestine without perforation or abscess without bleeding: Secondary | ICD-10-CM | POA: Insufficient documentation

## 2020-08-09 DIAGNOSIS — K626 Ulcer of anus and rectum: Secondary | ICD-10-CM | POA: Insufficient documentation

## 2020-08-09 DIAGNOSIS — R197 Diarrhea, unspecified: Secondary | ICD-10-CM | POA: Diagnosis not present

## 2020-08-09 DIAGNOSIS — Z87891 Personal history of nicotine dependence: Secondary | ICD-10-CM | POA: Insufficient documentation

## 2020-08-09 DIAGNOSIS — K5790 Diverticulosis of intestine, part unspecified, without perforation or abscess without bleeding: Secondary | ICD-10-CM | POA: Diagnosis not present

## 2020-08-09 DIAGNOSIS — Z791 Long term (current) use of non-steroidal anti-inflammatories (NSAID): Secondary | ICD-10-CM | POA: Insufficient documentation

## 2020-08-09 DIAGNOSIS — D126 Benign neoplasm of colon, unspecified: Secondary | ICD-10-CM | POA: Insufficient documentation

## 2020-08-09 DIAGNOSIS — K627 Radiation proctitis: Secondary | ICD-10-CM | POA: Diagnosis not present

## 2020-08-09 HISTORY — DX: Unilateral inguinal hernia, without obstruction or gangrene, not specified as recurrent: K40.90

## 2020-08-09 HISTORY — DX: Hyperlipidemia, unspecified: E78.5

## 2020-08-09 HISTORY — PX: COLONOSCOPY WITH PROPOFOL: SHX5780

## 2020-08-09 HISTORY — DX: Unspecified osteoarthritis, unspecified site: M19.90

## 2020-08-09 HISTORY — DX: Elevated prostate specific antigen (PSA): R97.20

## 2020-08-09 SURGERY — COLONOSCOPY WITH PROPOFOL
Anesthesia: General

## 2020-08-09 MED ORDER — LIDOCAINE HCL (PF) 2 % IJ SOLN
INTRAMUSCULAR | Status: AC
  Filled 2020-08-09: qty 10

## 2020-08-09 MED ORDER — PROPOFOL 10 MG/ML IV BOLUS
INTRAVENOUS | Status: DC | PRN
  Administered 2020-08-09: 20 mg via INTRAVENOUS
  Administered 2020-08-09: 10 mg via INTRAVENOUS
  Administered 2020-08-09: 20 mg via INTRAVENOUS
  Administered 2020-08-09: 40 mg via INTRAVENOUS

## 2020-08-09 MED ORDER — LIDOCAINE HCL (CARDIAC) PF 100 MG/5ML IV SOSY
PREFILLED_SYRINGE | INTRAVENOUS | Status: DC | PRN
  Administered 2020-08-09: 80 mg via INTRATRACHEAL

## 2020-08-09 MED ORDER — PROPOFOL 500 MG/50ML IV EMUL
INTRAVENOUS | Status: DC | PRN
  Administered 2020-08-09: 130 ug/kg/min via INTRAVENOUS

## 2020-08-09 MED ORDER — SODIUM CHLORIDE 0.9 % IV SOLN
INTRAVENOUS | Status: DC
  Administered 2020-08-09: 20 mL/h via INTRAVENOUS

## 2020-08-09 MED ORDER — EPHEDRINE SULFATE 50 MG/ML IJ SOLN
INTRAMUSCULAR | Status: DC | PRN
  Administered 2020-08-09: 10 mg via INTRAVENOUS

## 2020-08-09 MED ORDER — PROPOFOL 10 MG/ML IV BOLUS
INTRAVENOUS | Status: AC
  Filled 2020-08-09: qty 20

## 2020-08-09 MED ORDER — PROPOFOL 500 MG/50ML IV EMUL
INTRAVENOUS | Status: AC
  Filled 2020-08-09: qty 50

## 2020-08-09 NOTE — Interval H&P Note (Signed)
History and Physical Interval Note:  08/09/2020 9:24 AM  Andrew Walsh  has presented today for surgery, with the diagnosis of DIARRHEA HEMATOCHEZIA.  The various methods of treatment have been discussed with the patient and family. After consideration of risks, benefits and other options for treatment, the patient has consented to  Procedure(s): COLONOSCOPY WITH PROPOFOL (N/A) as a surgical intervention.  The patient's history has been reviewed, patient examined, no change in status, stable for surgery.  I have reviewed the patient's chart and labs.  Questions were answered to the patient's satisfaction.     [place], [place]

## 2020-08-09 NOTE — Transfer of Care (Signed)
Immediate Anesthesia Transfer of Care Note  Patient: JAVARIE CRISP  Procedure(s) Performed: COLONOSCOPY WITH PROPOFOL (N/A )  Patient Location: PACU and Endoscopy Unit  Anesthesia Type:General  Level of Consciousness: drowsy  Airway & Oxygen Therapy: Patient Spontanous Breathing  Post-op Assessment: Report given to RN and Post -op Vital signs reviewed and stable  Post vital signs: Reviewed and stable  Last Vitals:  Vitals Value Taken Time  BP 97/63 08/09/20 1003  Temp 35.8 C 08/09/20 1001  Pulse 58 08/09/20 1004  Resp 18 08/09/20 1004  SpO2 99 % 08/09/20 1004  Vitals shown include unvalidated device data.  Last Pain:  Vitals:   08/09/20 1001  TempSrc: Temporal  PainSc: Asleep         Complications: No complications documented.

## 2020-08-09 NOTE — Anesthesia Preprocedure Evaluation (Signed)
Anesthesia Evaluation  Patient identified by MRN, date of birth, ID band Patient awake    Reviewed: Allergy & Precautions, NPO status , Patient's Chart, lab work & pertinent test results  History of Anesthesia Complications Negative for: history of anesthetic complications  Airway Mallampati: II       Dental   Pulmonary neg sleep apnea, neg COPD, Not current smoker, former smoker,           Cardiovascular (-) hypertension(-) Past MI and (-) CHF (-) dysrhythmias (-) Valvular Problems/Murmurs     Neuro/Psych neg Seizures    GI/Hepatic Neg liver ROS, neg GERD  ,  Endo/Other  neg diabetes  Renal/GU negative Renal ROS     Musculoskeletal   Abdominal   Peds  Hematology   Anesthesia Other Findings   Reproductive/Obstetrics                             Anesthesia Physical Anesthesia Plan  ASA: II  Anesthesia Plan: General   Post-op Pain Management:    Induction: Intravenous  PONV Risk Score and Plan: 2 and Propofol infusion and TIVA  Airway Management Planned: Nasal Cannula  Additional Equipment:   Intra-op Plan:   Post-operative Plan:   Informed Consent: I have reviewed the patients History and Physical, chart, labs and discussed the procedure including the risks, benefits and alternatives for the proposed anesthesia with the patient or authorized representative who has indicated his/her understanding and acceptance.       Plan Discussed with:   Anesthesia Plan Comments:         Anesthesia Quick Evaluation

## 2020-08-09 NOTE — Op Note (Signed)
[facility] Gastroenterology Patient Name: Andrew Walsh Procedure Date: 08/09/2020 9:31 AM MRN: [id number] Account #: [id number] Date of Birth: [birth year] Admit Type: Outpatient Age: 79 Room: [facility] ENDO ROOM 2 Gender: Male Note Status: Finalized Procedure:             Colonoscopy Indications:           Functional diarrhea, Hematochezia, Fecal incontinence Providers:             Benay Pike. [name] MD, MD Referring MD:          Ramonita Lab, MD (Referring MD) Medicines:             Propofol per Anesthesia Complications:         No immediate complications. Estimated blood loss:                         Minimal. Procedure:             Pre-Anesthesia Assessment:                        - The risks and benefits of the procedure and the                         sedation options and risks were discussed with the                         patient. All questions were answered and informed                         consent was obtained.                        - The risks and benefits of the procedure and the                         sedation options and risks were discussed with the                         patient. All questions were answered and informed                         consent was obtained.                        - Patient identification and proposed procedure were                         verified prior to the procedure by the nurse. The                         procedure was verified in the procedure room.                        - ASA Grade Assessment: III - A patient with severe                         systemic disease.                        - After reviewing the risks  and benefits, the patient                         was deemed in satisfactory condition to undergo the                         procedure.                        After obtaining informed consent, the colonoscope was                         passed under direct vision. Throughout the procedure,                          the patient's blood pressure, pulse, and oxygen                         saturations were monitored continuously. The                         Colonoscope was introduced through the anus and                         advanced to the the cecum, identified by appendiceal                         orifice and ileocecal valve. The colonoscopy was                         somewhat difficult due to multiple diverticula in the                         colon and significant looping. Successful completion                         of the procedure was aided by applying abdominal                         pressure. The patient tolerated the procedure well.                         The quality of the bowel preparation was good. The                         ileocecal valve, appendiceal orifice, and rectum were                         photographed. Findings:      The perianal and digital rectal examinations were normal. Pertinent       negatives include normal sphincter tone and no palpable rectal lesions.      Multiple small and large-mouthed diverticula were found in the sigmoid       colon and descending colon. There was no evidence of diverticular       bleeding.      A few medium-mouthed diverticula were found in the transverse colon and       ascending colon. There was no evidence of diverticular bleeding.      Normal mucosa was found  in the cecum, in the left colon and in the right       colon. Biopsies for histology were taken with a cold forceps from the       random colon for evaluation of microscopic colitis.      Numerous non-bleeding telangiectasias were noted in the distal rectum       compatible with radiation proctitis. Coagulation for bleeding prevention       using argon plasma at 0.8 liters/minute and 20 watts was successful.      A single (solitary) eight mm ulcer was found in the distal rectum. No       bleeding was present. No stigmata of recent bleeding were seen. Biopsies       were  taken with a cold forceps for histology. Coagulation for hemostasis       of bleeding caused by the procedure using argon plasma at 0.8       liters/minute and 20 watts was successful. Estimated blood loss was       minimal.      The exam was otherwise without abnormality on direct and retroflexion       views. Impression:            - Moderate diverticulosis in the sigmoid colon and in                         the descending colon. There was no evidence of                         diverticular bleeding.                        - Mild diverticulosis in the transverse colon and in                         the ascending colon. There was no evidence of                         diverticular bleeding.                        - Normal mucosa in the cecum, in the left colon and in                         the right colon. Biopsied.                        - A single (solitary) ulcer in the distal rectum.                         Biopsied. Treated with argon plasma coagulation (APC).                        - The examination was otherwise normal on direct and                         retroflexion views. Recommendation:        - Patient has a contact number available for                         emergencies. The signs and symptoms  of potential                         delayed complications were discussed with the patient.                         Return to normal activities tomorrow. Written                         discharge instructions were provided to the patient.                        - Resume previous diet.                        - Continue present medications.                        - Await pathology results.                        - No recommendation at this time regarding repeat                         colonoscopy due to age.                        - If biopsies are benign or adenomatous without                         dysplasia, I will advise NO further colonoscopy due to                          advanced age and/or severe comorbidity.                        - Return to GI office in 3 months.                        - The findings and recommendations were discussed with                         the patient. Procedure Code(s):     --- Professional ---                        [phone], 108, Colonoscopy, flexible; with control of                         bleeding, any method                        45380, Colonoscopy, flexible; with biopsy, single or                         multiple Diagnosis Code(s):     --- Professional ---                        K57.30, Diverticulosis of large intestine without                         perforation  or abscess without bleeding                        R15.9, Full incontinence of feces                        K92.1, Melena (includes Hematochezia)                        K59.1, Functional diarrhea                        K62.6, Ulcer of anus and rectum CPT copyright 2019 American Medical Association. All rights reserved. The codes documented in this report are preliminary and upon coder review may  be revised to meet current compliance requirements. Efrain Sella MD, MD 08/09/2020 10:07:14 AM This report has been signed electronically. Number of Addenda: 0 Note Initiated On: 08/09/2020 9:31 AM Scope Withdrawal Time: 0 hours 10 minutes 29 seconds  Total Procedure Duration: 0 hours 20 minutes 3 seconds  Estimated Blood Loss:  Estimated blood loss was minimal.      [facility]

## 2020-08-09 NOTE — Anesthesia Postprocedure Evaluation (Signed)
Anesthesia Post Note  Patient: Andrew Walsh  Procedure(s) Performed: COLONOSCOPY WITH PROPOFOL (N/A )  Patient location during evaluation: Endoscopy Anesthesia Type: General Level of consciousness: awake and alert Pain management: pain level controlled Vital Signs Assessment: post-procedure vital signs reviewed and stable Respiratory status: spontaneous breathing and respiratory function stable Cardiovascular status: stable Anesthetic complications: no   No complications documented.   Last Vitals:  Vitals:   08/09/20 1011 08/09/20 1021  BP: (!) 169/78 (!) 142/76  Pulse: 76 (!) 59  Resp: (!) 24 (!) 28  Temp:    SpO2: 100% 100%    Last Pain:  Vitals:   08/09/20 1021  TempSrc:   PainSc: 0-No pain                 Maisee Vollman K

## 2020-08-09 NOTE — H&P (Signed)
Outpatient short stay form Pre-procedure 08/09/2020 9:23 AM Andrew Walsh K. Andrew Walsh, M.D.  Primary Physician: Ramonita Lab III, M.D.  Reason for visit:  Hematochezia, watery diarrhea  History of present illness: 79 y/o male with history of intermittent watery diarrhea with hematochezia. Previous Colonoscopy not on record. Denies weight loss or personal or family history of inflammatory bowel disease or colon cancer/polyps.    Current Facility-Administered Medications:  .  0.9 %  sodium chloride infusion, , Intravenous, Continuous, [place], Benay Pike, MD, Last Rate: 20 mL/hr at 08/09/20 0908, 20 mL/hr at 08/09/20 0908  Medications Prior to Admission  Medication Sig Dispense Refill Last Dose  . Calcium Carbonate-Vitamin D (CALCIUM-VITAMIN D3 PO) Take by mouth.   Past Week at Unknown time  . Glucosamine 500 MG CAPS Take by mouth.   Past Week at Unknown time  . Multiple Vitamin (MULTI-VITAMINS) TABS Take by mouth.    Past Week at Unknown time  . naproxen sodium (ALEVE) 220 MG tablet Take 220 mg by mouth.   Past Week at Unknown time  . Probiotic Product (BIO-KULT INFANTIS PO) Take by mouth every morning.   Past Week at Unknown time  . simvastatin (ZOCOR) 20 MG tablet    Past Week at Unknown time  . vitamin B-12 (CYANOCOBALAMIN) 1000 MCG tablet Take 1 tablet (1,000 mcg total) by mouth daily. 90 tablet 1 Past Week at Unknown time     No Known Allergies   Past Medical History:  Diagnosis Date  . Actinic keratosis 01/31/2016   Left lateral neck post auricular. AK and prurigo nodularis  . Actinic keratosis 01/31/2016   Left lateral neck post auricular, inferior. AK and prurigo nodularis  . Actinic keratosis 12/19/2017   Left ear anti-helix  . Ankle arthropathy 02/24/2014  . Arthritis of foot, degenerative 10/03/2012   Overview:  Subtalar arthritis   . Basal cell carcinoma 04/30/2008   Left mid dorsum nose.  . Basal cell carcinoma 07/12/2009   Left post. deltoid sup. lat. edge of cancer scar.    . Cancer ([place])    PROSTATE  . Elevated PSA   . Hereditary hemochromatosis ([place]) 11/03/2015   Overview:  Treated by regular blood donation   . History of kidney stones   . HLD (hyperlipidemia) 11/03/2015  . Hyperlipidemia   . Inguinal hernia   . Localized, primary osteoarthritis of ankle or foot 02/24/2014  . Osteoarthritis   . Squamous cell carcinoma of skin 05/17/2010   Left forearm. KA-like pattern. EDC.  Marland Kitchen Squamous cell carcinoma of skin 12/18/2016   Right mid lat. volar forearm.WD SCC. EDC  . Squamous cell carcinoma of skin 01/22/2019   Right lateral distal tricep near elbow.   . Squamous cell carcinoma of skin 07/14/2019   Right forearm. WD SCC. EDC  . Squamous cell carcinoma of skin 09/29/2019   Right proximal mandible. SCCis    Review of systems:  Otherwise negative.    Physical Exam  Gen: Alert, oriented. Appears stated age.  HEENT: [place]/AT. PERRLA. Lungs: CTA, no wheezes. CV: RR nl S1, S2. Abd: soft, benign, no masses. BS+ Ext: No edema. Pulses 2+    Planned procedures: Proceed with colonoscopy. The patient understands the nature of the planned procedure, indications, risks, alternatives and potential complications including but not limited to bleeding, infection, perforation, damage to internal organs and possible oversedation/side effects from anesthesia. The patient agrees and gives consent to proceed.  Please refer to procedure notes for findings, recommendations and patient disposition/instructions.     [employer]  K. Andrew Walsh, M.D. Gastroenterology 08/09/2020  9:23 AM

## 2020-08-09 NOTE — Anesthesia Procedure Notes (Signed)
Procedure Name: MAC Date/Time: 08/09/2020 9:39 AM Performed by: Lily Peer, Jenin Birdsall, CRNA Pre-anesthesia Checklist: Patient identified, Emergency Drugs available, Suction available and Patient being monitored Oxygen Delivery Method: Nasal cannula

## 2020-08-10 ENCOUNTER — Encounter: Payer: Self-pay | Admitting: Internal Medicine

## 2020-08-11 LAB — SURGICAL PATHOLOGY

## 2020-08-17 ENCOUNTER — Encounter: Payer: Self-pay | Admitting: Dermatology

## 2020-08-17 ENCOUNTER — Ambulatory Visit (INDEPENDENT_AMBULATORY_CARE_PROVIDER_SITE_OTHER): Payer: Medicare HMO | Admitting: Dermatology

## 2020-08-17 ENCOUNTER — Other Ambulatory Visit: Payer: Self-pay

## 2020-08-17 DIAGNOSIS — L988 Other specified disorders of the skin and subcutaneous tissue: Secondary | ICD-10-CM | POA: Diagnosis not present

## 2020-08-17 DIAGNOSIS — C4492 Squamous cell carcinoma of skin, unspecified: Secondary | ICD-10-CM

## 2020-08-17 DIAGNOSIS — C44229 Squamous cell carcinoma of skin of left ear and external auricular canal: Secondary | ICD-10-CM

## 2020-08-17 MED ORDER — MUPIROCIN 2 % EX OINT: 1.0000 "application " | TOPICAL_OINTMENT | Freq: Every day | CUTANEOUS | 0 refills | Status: DC

## 2020-08-17 NOTE — Progress Notes (Signed)
   Follow-Up Visit   Subjective  Andrew Walsh is a 79 y.o. male who presents for the following: Procedure (Biopsy proven SCC of left ear sup helix - Excise today).  The following portions of the chart were reviewed this encounter and updated as appropriate:   Tobacco  Allergies  Meds  Problems  Med Hx  Surg Hx  Fam Hx     Review of Systems:  No other skin or systemic complaints except as noted in HPI or Assessment and Plan.  Objective  Well appearing patient in no apparent distress; mood and affect are within normal limits.  A focused examination was performed including L ear. Relevant physical exam findings are noted in the Assessment and Plan.  Objective  Left Superior Helix: 1.1 cm healing biopsy site   Assessment & Plan  Squamous cell carcinoma of skin Left Superior Helix  mupirocin ointment (BACTROBAN) 2 %  Skin excision  Lesion length (cm):  1.1 Lesion width (cm):  1.1 Margin per side (cm):  0.2 Total excision diameter (cm):  1.5 Informed consent: discussed and consent obtained   Timeout: patient name, date of birth, surgical site, and procedure verified   Procedure prep:  Patient was prepped and draped in usual sterile fashion Prep type:  Isopropyl alcohol and povidone-iodine Anesthesia: the lesion was anesthetized in a standard fashion   Anesthetic:  1% lidocaine w/ epinephrine 1-100,000 buffered w/ 8.4% NaHCO3 (3cc) Instrument used: #15 blade   Hemostasis achieved with: pressure   Hemostasis achieved with comment:  Electrocautery Outcome: patient tolerated procedure well with no complications   Post-procedure details: sterile dressing applied and wound care instructions given   Dressing type: bandage and pressure dressing (mupirocin)   Additional details:  Tag Lateral external 6:00 tip  Skin repair Complexity:  Intermediate Final length (cm):  2.2 Informed consent: discussed and consent obtained   Reason for type of repair: reduce tension to allow  closure, reduce the risk of dehiscence, infection, and necrosis, reduce subcutaneous dead space and avoid a hematoma, allow closure of the large defect, preserve normal anatomy, preserve normal anatomical and functional relationships and enhance both functionality and cosmetic results   Undermining: edges undermined   Undermining comment:  Underming defect 0.5cm Subcutaneous layers (deep stitches):  Suture size:  4-0 Suture type: Vicryl (polyglactin 910)   Subcutaneous suture technique: inverted dermal. Fine/surface layer approximation (top stitches):  Suture size:  4-0 Suture type: nylon   Suture type comment:  Nylon Stitches: horizontal mattress   Hemostasis achieved with: suture and pressure Hemostasis achieved with comment:  Electrocautery Outcome: patient tolerated procedure well with no complications   Post-procedure details: sterile dressing applied and wound care instructions given   Dressing type: bandage, pressure dressing and bacitracin (mupirocin)    Specimen 1 - Surgical pathology Differential Diagnosis: Biopsy proven SCC Check Margins: No 1.1 cm healing biopsy site [id number]  Biopsy proven SCC  Return in about 1 week (around 08/24/2020) for suture removal.   Documentation: I have reviewed the above documentation for accuracy and completeness, and I agree with the above.  Sarina Ser, MD

## 2020-08-17 NOTE — Patient Instructions (Signed)

## 2020-08-18 ENCOUNTER — Inpatient Hospital Stay: Payer: Medicare HMO | Attending: Oncology

## 2020-08-18 ENCOUNTER — Telehealth: Payer: Self-pay

## 2020-08-18 LAB — HEMOGLOBIN AND HEMATOCRIT, BLOOD
HCT: 37.2 % — ABNORMAL LOW (ref 39.0–52.0)
Hemoglobin: 13 g/dL (ref 13.0–17.0)

## 2020-08-18 LAB — FERRITIN: Ferritin: 105 ng/mL (ref 24–336)

## 2020-08-18 LAB — IRON AND TIBC
Iron: 138 ug/dL (ref 45–182)
Saturation Ratios: 45 % — ABNORMAL HIGH (ref 17.9–39.5)
TIBC: 305 ug/dL (ref 250–450)
UIBC: 167 ug/dL

## 2020-08-18 NOTE — Telephone Encounter (Signed)
Spoke with patient regarding surgery. He is doing fine/hd  

## 2020-08-20 ENCOUNTER — Inpatient Hospital Stay: Payer: Medicare HMO

## 2020-08-20 NOTE — Progress Notes (Signed)
When pt arrived to infusion for his phlebotomy, he expressed to RN that he would like to hold off on the phlebotomy today because he was feeling tired and "under the weather" MD notified. Per MD it is okay to hold phlebotomy today and keep appointments as scheduled. Pt updated and all questions answered at this time. VSS. Pt stable for discharge.   [place] [employer]

## 2020-08-23 ENCOUNTER — Encounter: Payer: Self-pay | Admitting: Dermatology

## 2020-08-24 ENCOUNTER — Ambulatory Visit (INDEPENDENT_AMBULATORY_CARE_PROVIDER_SITE_OTHER): Payer: Medicare HMO | Admitting: Dermatology

## 2020-08-24 ENCOUNTER — Other Ambulatory Visit: Payer: Self-pay

## 2020-08-24 DIAGNOSIS — Z4802 Encounter for removal of sutures: Secondary | ICD-10-CM

## 2020-08-24 DIAGNOSIS — Z85828 Personal history of other malignant neoplasm of skin: Secondary | ICD-10-CM

## 2020-08-24 NOTE — Progress Notes (Signed)
   Follow-Up Visit   Subjective  Andrew Walsh is a 79 y.o. male who presents for the following: Follow-up (Post op SCC margins free of left sup helix).  The following portions of the chart were reviewed this encounter and updated as appropriate:   Tobacco  Allergies  Meds  Problems  Med Hx  Surg Hx  Fam Hx     Review of Systems:  No other skin or systemic complaints except as noted in HPI or Assessment and Plan.  Objective  Well appearing patient in no apparent distress; mood and affect are within normal limits.  A focused examination was performed including L ear . Relevant physical exam findings are noted in the Assessment and Plan.  Objective  Left superior helix: Well healed scar with no evidence of recurrence, no lymphadenopathy.    Assessment & Plan  History of SCC (squamous cell carcinoma) of skin Left superior helix  Encounter for Removal of Sutures - Incision site at the L superior helix is clean, dry and intact - Wound cleansed, sutures removed, wound cleansed and steri strips applied.  - Discussed pathology results showing SCC - Patient advised to keep steri-strips dry until they fall off. - Scars remodel for a full year. - Once steri-strips fall off, patient can apply over-the-counter silicone scar cream each night to help with scar remodeling if desired. - Patient advised to call with any concerns or if they notice any new or changing lesions.   Return for as scheduled in April 2022 .  IMarye Round, CMA, am acting as scribe for Sarina Ser, MD .  Documentation: I have reviewed the above documentation for accuracy and completeness, and I agree with the above.  Sarina Ser, MD

## 2020-08-29 ENCOUNTER — Encounter: Payer: Self-pay | Admitting: Dermatology

## 2020-09-27 ENCOUNTER — Inpatient Hospital Stay: Payer: Medicare HMO | Attending: Oncology

## 2020-09-27 DIAGNOSIS — Z85828 Personal history of other malignant neoplasm of skin: Secondary | ICD-10-CM | POA: Diagnosis not present

## 2020-09-27 DIAGNOSIS — R7989 Other specified abnormal findings of blood chemistry: Secondary | ICD-10-CM | POA: Insufficient documentation

## 2020-09-27 DIAGNOSIS — Z79899 Other long term (current) drug therapy: Secondary | ICD-10-CM | POA: Diagnosis not present

## 2020-09-27 DIAGNOSIS — Z87891 Personal history of nicotine dependence: Secondary | ICD-10-CM | POA: Insufficient documentation

## 2020-09-27 DIAGNOSIS — D7589 Other specified diseases of blood and blood-forming organs: Secondary | ICD-10-CM | POA: Diagnosis not present

## 2020-09-27 DIAGNOSIS — Z87442 Personal history of urinary calculi: Secondary | ICD-10-CM | POA: Insufficient documentation

## 2020-09-27 DIAGNOSIS — C61 Malignant neoplasm of prostate: Secondary | ICD-10-CM | POA: Insufficient documentation

## 2020-09-27 DIAGNOSIS — Z923 Personal history of irradiation: Secondary | ICD-10-CM | POA: Diagnosis not present

## 2020-09-27 DIAGNOSIS — D649 Anemia, unspecified: Secondary | ICD-10-CM | POA: Diagnosis not present

## 2020-09-27 LAB — HEPATIC FUNCTION PANEL
ALT: 20 U/L (ref 0–44)
AST: 26 U/L (ref 15–41)
Albumin: 4 g/dL (ref 3.5–5.0)
Alkaline Phosphatase: 44 U/L (ref 38–126)
Bilirubin, Direct: 0.1 mg/dL (ref 0.0–0.2)
Indirect Bilirubin: 0.6 mg/dL (ref 0.3–0.9)
Total Bilirubin: 0.7 mg/dL (ref 0.3–1.2)
Total Protein: 7 g/dL (ref 6.5–8.1)

## 2020-09-27 LAB — CBC WITH DIFFERENTIAL/PLATELET
Abs Immature Granulocytes: 0.03 10*3/uL (ref 0.00–0.07)
Basophils Absolute: 0 10*3/uL (ref 0.0–0.1)
Basophils Relative: 0 %
Eosinophils Absolute: 0.1 10*3/uL (ref 0.0–0.5)
Eosinophils Relative: 2 %
HCT: 38.1 % — ABNORMAL LOW (ref 39.0–52.0)
Hemoglobin: 13.7 g/dL (ref 13.0–17.0)
Immature Granulocytes: 1 %
Lymphocytes Relative: 20 %
Lymphs Abs: 1.2 10*3/uL (ref 0.7–4.0)
MCH: 36.7 pg — ABNORMAL HIGH (ref 26.0–34.0)
MCHC: 36 g/dL (ref 30.0–36.0)
MCV: 102.1 fL — ABNORMAL HIGH (ref 80.0–100.0)
Monocytes Absolute: 0.6 10*3/uL (ref 0.1–1.0)
Monocytes Relative: 10 %
Neutro Abs: 3.9 10*3/uL (ref 1.7–7.7)
Neutrophils Relative %: 67 %
Platelets: 212 10*3/uL (ref 150–400)
RBC: 3.73 MIL/uL — ABNORMAL LOW (ref 4.22–5.81)
RDW: 11.8 % (ref 11.5–15.5)
WBC: 5.8 10*3/uL (ref 4.0–10.5)
nRBC: 0 % (ref 0.0–0.2)

## 2020-09-27 LAB — FERRITIN: Ferritin: 124 ng/mL (ref 24–336)

## 2020-09-27 LAB — IRON AND TIBC
Iron: 124 ug/dL (ref 45–182)
Saturation Ratios: 42 % — ABNORMAL HIGH (ref 17.9–39.5)
TIBC: 298 ug/dL (ref 250–450)
UIBC: 174 ug/dL

## 2020-09-28 ENCOUNTER — Encounter: Payer: Self-pay | Admitting: Oncology

## 2020-09-28 ENCOUNTER — Inpatient Hospital Stay: Payer: Medicare HMO

## 2020-09-28 ENCOUNTER — Inpatient Hospital Stay: Payer: Medicare HMO | Admitting: Oncology

## 2020-09-28 DIAGNOSIS — Z87442 Personal history of urinary calculi: Secondary | ICD-10-CM | POA: Diagnosis not present

## 2020-09-28 DIAGNOSIS — D649 Anemia, unspecified: Secondary | ICD-10-CM | POA: Diagnosis not present

## 2020-09-28 DIAGNOSIS — Z87891 Personal history of nicotine dependence: Secondary | ICD-10-CM | POA: Diagnosis not present

## 2020-09-28 DIAGNOSIS — Z79899 Other long term (current) drug therapy: Secondary | ICD-10-CM | POA: Diagnosis not present

## 2020-09-28 DIAGNOSIS — Z85828 Personal history of other malignant neoplasm of skin: Secondary | ICD-10-CM | POA: Diagnosis not present

## 2020-09-28 DIAGNOSIS — C61 Malignant neoplasm of prostate: Secondary | ICD-10-CM | POA: Diagnosis not present

## 2020-09-28 DIAGNOSIS — R7989 Other specified abnormal findings of blood chemistry: Secondary | ICD-10-CM | POA: Diagnosis not present

## 2020-09-28 DIAGNOSIS — D7589 Other specified diseases of blood and blood-forming organs: Secondary | ICD-10-CM | POA: Diagnosis not present

## 2020-09-28 NOTE — Progress Notes (Signed)
Hematology/Oncology note [facility] Telephone:([phone] [phone]   Patient Care Team: Adin Hector, MD as PCP - General (Internal Medicine)  REFERRING PROVIDER: Adin Hector, MD  CHIEF COMPLAINTS/REASON FOR VISIT:  Establish care for hemochromatosis management  HISTORY OF PRESENTING ILLNESS:  Andrew Walsh is a 80 y.o. male who was seen in consultation at the request of Adin Hector, MD for evaluation of elevated ferritin level/hemochromatosis management. Patient reports a history  hereditary hemochromatosis.  He was diagnosed in 48s by his previous primary care provider in [place] and he has been an active blood donor every 8 to 10 weeks for many years.  Recently he was diagnosed with prostate cancer Gleason score 4+3 with a PSA of 9.4 and underwent IMRT radiation by Dr. Baruch Gouty and he finished in September 2020. He is mildly anemic and also cannot further donate blood due to recent radiation and was referred to me for management of hemochromatosis and iron overload.   Context Shortness of breath: Patient reports mild shortness of breath after exertion recently and he attributes to Covid pandemic and not able to be physically active lately. Fatigue: denies He had blood work done at [place] [facility] primary care provider's office recently. 08/19/2019, hemoglobin 14, hematocrit 40.7, MCV 105.7. Iron 254, ferritin 514,  07/18/2019, iron 259, ferritin 476, transferrin 193.5, TIBC 270, iron saturation 96  INTERVAL HISTORY Andrew Walsh is a 80 y.o. male who has above history reviewed by me today presents for follow up visit for management of iron overload/hemochromatosis. Problems and complaints are listed below: Patient continues to drink alcohol, a few glasses of wine.  He has no new complaints.    Review of Systems  Constitutional: Negative for appetite change, chills, fatigue, fever and unexpected weight change.  HENT:    Negative for hearing loss and voice change.   Eyes: Negative for eye problems and icterus.  Respiratory: Negative for chest tightness, cough and shortness of breath.   Cardiovascular: Negative for chest pain and leg swelling.  Gastrointestinal: Negative for abdominal distention and abdominal pain.  Endocrine: Negative for hot flashes.  Genitourinary: Negative for difficulty urinating, dysuria and frequency.   Musculoskeletal: Negative for arthralgias.  Skin: Negative for itching and rash.  Neurological: Negative for light-headedness and numbness.  Hematological: Negative for adenopathy. Does not bruise/bleed easily.  Psychiatric/Behavioral: Negative for confusion.    MEDICAL HISTORY:  Past Medical History:  Diagnosis Date  . Actinic keratosis 01/31/2016   Left lateral neck post auricular. AK and prurigo nodularis  . Actinic keratosis 01/31/2016   Left lateral neck post auricular, inferior. AK and prurigo nodularis  . Actinic keratosis 12/19/2017   Left ear anti-helix  . Ankle arthropathy 02/24/2014  . Arthritis of foot, degenerative 10/03/2012   Overview:  Subtalar arthritis   . Basal cell carcinoma 04/30/2008   Left mid dorsum nose.  . Basal cell carcinoma 07/12/2009   Left post. deltoid sup. lat. edge of cancer scar.   . Cancer ([place])    PROSTATE  . Elevated PSA   . Hereditary hemochromatosis ([place]) 11/03/2015   Overview:  Treated by regular blood donation   . History of kidney stones   . HLD (hyperlipidemia) 11/03/2015  . Hyperlipidemia   . Inguinal hernia   . Localized, primary osteoarthritis of ankle or foot 02/24/2014  . Osteoarthritis   . Squamous cell carcinoma of skin 05/17/2010   Left forearm. KA-like pattern. EDC.  Marland Kitchen Squamous cell carcinoma of  skin 12/18/2016   Right mid lat. volar forearm.WD SCC. EDC  . Squamous cell carcinoma of skin 01/22/2019   Right lateral distal tricep near elbow.   . Squamous cell carcinoma of skin 07/14/2019   Right forearm. WD SCC. EDC   . Squamous cell carcinoma of skin 09/29/2019   Right proximal mandible. SCCis  . Squamous cell carcinoma of skin 08/17/2020   Left ear sup helix    SURGICAL HISTORY: Past Surgical History:  Procedure Laterality Date  . ANKLE FUSION Right 2001, 2016  . BONE MARROW ASPIRATION    . COLONOSCOPY WITH PROPOFOL N/A 08/09/2020   Procedure: COLONOSCOPY WITH PROPOFOL;  Surgeon: Toledo, Benay Pike, MD;  Location: [facility] ENDOSCOPY;  Service: Gastroenterology;  Laterality: N/A;  . HYDROCELE EXCISION / REPAIR    . JOINT REPLACEMENT    . REVISION TOTAL HIP ARTHROPLASTY  2006   Right 2011, Left 2006    SOCIAL HISTORY: Social History   Socioeconomic History  . Marital status: Married    Spouse name: Not on file  . Number of children: Not on file  . Years of education: Not on file  . Highest education level: Not on file  Occupational History  . Not on file  Tobacco Use  . Smoking status: Former Smoker    Years: 20.00    Types: Cigarettes    Quit date: 09/10/1979    Years since quitting: 41.0  . Smokeless tobacco: Never Used  Vaping Use  . Vaping Use: Never used  Substance and Sexual Activity  . Alcohol use: Yes    Alcohol/week: 32.0 standard drinks    Types: 16 Glasses of wine, 16 Standard drinks or equivalent per week  . Drug use: Never  . Sexual activity: Not on file  Other Topics Concern  . Not on file  Social History Narrative  . Not on file   Social Determinants of Health   Financial Resource Strain: Not on file  Food Insecurity: Not on file  Transportation Needs: Not on file  Physical Activity: Not on file  Stress: Not on file  Social Connections: Not on file  Intimate Partner Violence: Not on file    FAMILY HISTORY: Family History  Problem Relation Age of Onset  . Bladder Cancer Neg Hx   . Prostate cancer Neg Hx   . Kidney cancer Neg Hx     ALLERGIES:  has No Known Allergies.  MEDICATIONS:  Current Outpatient Medications  Medication Sig Dispense Refill  .  Calcium Carbonate-Vitamin D (CALCIUM-VITAMIN D3 PO) Take by mouth.    . Glucosamine 500 MG CAPS Take by mouth.    . Multiple Vitamin (MULTI-VITAMINS) TABS Take by mouth.     . mupirocin ointment (BACTROBAN) 2 % Apply 1 application topically daily. With dressing changes 22 g 0  . naproxen sodium (ALEVE) 220 MG tablet Take 220 mg by mouth.    . Probiotic Product (BIO-KULT INFANTIS PO) Take by mouth every morning.    . simvastatin (ZOCOR) 20 MG tablet     . vitamin B-12 (CYANOCOBALAMIN) 1000 MCG tablet Take 1 tablet (1,000 mcg total) by mouth daily. 90 tablet 1   No current facility-administered medications for this visit.     PHYSICAL EXAMINATION: ECOG PERFORMANCE STATUS: 1 - Symptomatic but completely ambulatory Vitals:   09/28/20 1311  BP: 137/89  Pulse: 73  Resp: 16  Temp: 98.5 F (36.9 C)   Filed Weights   09/28/20 1311  Weight: 166 lb (75.3 kg)    Physical  Exam Constitutional:      General: He is not in acute distress. HENT:     Head: Normocephalic and atraumatic.  Eyes:     General: No scleral icterus.    Pupils: Pupils are equal, round, and reactive to light.  Cardiovascular:     Rate and Rhythm: Normal rate and regular rhythm.     Heart sounds: Normal heart sounds.  Pulmonary:     Effort: Pulmonary effort is normal. No respiratory distress.     Breath sounds: No wheezing.  Abdominal:     General: Bowel sounds are normal. There is no distension.     Palpations: Abdomen is soft. There is no mass.     Tenderness: There is no abdominal tenderness.  Musculoskeletal:        General: No swelling or deformity. Normal range of motion.     Cervical back: Normal range of motion and neck supple.  Skin:    General: Skin is warm and dry.     Findings: No erythema or rash.  Neurological:     Mental Status: He is alert and oriented to person, place, and time. Mental status is at baseline.     Cranial Nerves: No cranial nerve deficit.  Psychiatric:        Mood and Affect:  Mood normal.      LABORATORY DATA:  I have reviewed the data as listed Lab Results  Component Value Date   WBC 5.8 09/27/2020   HGB 13.7 09/27/2020   HCT 38.1 (L) 09/27/2020   MCV 102.1 (H) 09/27/2020   PLT 212 09/27/2020   Recent Labs    03/01/20 1319 06/22/20 1102 09/27/20 1141  NA 138  --   --   K 4.4  --   --   CL 102  --   --   CO2 27  --   --   GLUCOSE 94  --   --   BUN 12  --   --   CREATININE 0.89  --   --   CALCIUM 9.2  --   --   GFRNONAA >60  --   --   GFRAA >60  --   --   PROT 7.3 7.2 7.0  ALBUMIN 4.1 4.2 4.0  AST 31 32 26  ALT 29 28 20   ALKPHOS 52 44 44  BILITOT 0.9 1.1 0.7  BILIDIR  --  0.2 0.1  IBILI  --  0.9 0.6   Iron/TIBC/Ferritin/ %Sat    Component Value Date/Time   IRON 124 09/27/2020 1141   TIBC 298 09/27/2020 1141   FERRITIN 124 09/27/2020 1141   IRONPCTSAT 42 (H) 09/27/2020 1141      RADIOGRAPHIC STUDIES: I have personally reviewed the radiological images as listed and agreed with the findings in the report.  No results found.   ASSESSMENT & PLAN:  1. Hereditary hemochromatosis ([place])   2. Malignant neoplasm of prostate [facility])    #History of hereditary hemochromatosis.  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 Labs reviewed and discussed with patient. Ferritin is 124, iron saturation 42. I had a discussion with the patient again that iron panel can be falsely affected due to chronic alcohol use or underlying liver disease. Currently only isolated increased iron saturation.  Stable liver functions. Recommend observation at this point. He is not interested in alcohol cessation,   #History of prostate cancer, status post radiation.  Follows up with Dr. Leda Quail. Erlene Quan.. 04/15/2020 PSA is <0.01.  Continue follow-up with  urology in radiation oncology. #Macrocytosis 1/29/20201 normal vitamin B12 and folate level. This is likely secondary to chronic alcohol use.  Continue vitamin B12 supplementation..   All  questions were answered. The patient knows to call the clinic with any problems questions or concerns. Return of visit: 3 months.  Lab MD, CBC and iron.  TIBC ferritin  Earlie Server, MD, PhD Hematology Oncology [facility]  Pager- [phone] 09/28/2020

## 2020-09-28 NOTE — Progress Notes (Signed)
Patient denies new problems/concerns today.   °

## 2020-10-06 DIAGNOSIS — H2513 Age-related nuclear cataract, bilateral: Secondary | ICD-10-CM | POA: Diagnosis not present

## 2020-10-07 ENCOUNTER — Other Ambulatory Visit: Payer: Self-pay

## 2020-10-07 DIAGNOSIS — K591 Functional diarrhea: Secondary | ICD-10-CM | POA: Diagnosis not present

## 2020-10-07 DIAGNOSIS — C61 Malignant neoplasm of prostate: Secondary | ICD-10-CM

## 2020-10-07 DIAGNOSIS — K627 Radiation proctitis: Secondary | ICD-10-CM | POA: Diagnosis not present

## 2020-10-07 DIAGNOSIS — K921 Melena: Secondary | ICD-10-CM | POA: Diagnosis not present

## 2020-10-07 DIAGNOSIS — R197 Diarrhea, unspecified: Secondary | ICD-10-CM | POA: Diagnosis not present

## 2020-10-08 ENCOUNTER — Other Ambulatory Visit: Payer: Medicare HMO

## 2020-10-08 ENCOUNTER — Other Ambulatory Visit: Payer: Self-pay

## 2020-10-08 DIAGNOSIS — C61 Malignant neoplasm of prostate: Secondary | ICD-10-CM

## 2020-10-09 LAB — PSA: Prostate Specific Ag, Serum: <0.1 ng/mL (ref 0.0–4.0)

## 2020-12-15 ENCOUNTER — Other Ambulatory Visit: Payer: Self-pay

## 2020-12-15 ENCOUNTER — Ambulatory Visit: Payer: Medicare HMO | Admitting: Dermatology

## 2020-12-15 DIAGNOSIS — L821 Other seborrheic keratosis: Secondary | ICD-10-CM

## 2020-12-15 DIAGNOSIS — Z85828 Personal history of other malignant neoplasm of skin: Secondary | ICD-10-CM

## 2020-12-15 DIAGNOSIS — D229 Melanocytic nevi, unspecified: Secondary | ICD-10-CM

## 2020-12-15 DIAGNOSIS — L570 Actinic keratosis: Secondary | ICD-10-CM | POA: Diagnosis not present

## 2020-12-15 DIAGNOSIS — Z1283 Encounter for screening for malignant neoplasm of skin: Secondary | ICD-10-CM | POA: Diagnosis not present

## 2020-12-15 DIAGNOSIS — D1800 Hemangioma unspecified site: Secondary | ICD-10-CM | POA: Diagnosis not present

## 2020-12-15 DIAGNOSIS — L578 Other skin changes due to chronic exposure to nonionizing radiation: Secondary | ICD-10-CM | POA: Diagnosis not present

## 2020-12-15 DIAGNOSIS — L814 Other melanin hyperpigmentation: Secondary | ICD-10-CM | POA: Diagnosis not present

## 2020-12-15 DIAGNOSIS — L820 Inflamed seborrheic keratosis: Secondary | ICD-10-CM | POA: Diagnosis not present

## 2020-12-15 DIAGNOSIS — D692 Other nonthrombocytopenic purpura: Secondary | ICD-10-CM

## 2020-12-15 NOTE — Patient Instructions (Signed)

## 2020-12-15 NOTE — Progress Notes (Signed)
Follow-Up Visit   Subjective  Andrew Walsh is a 80 y.o. male who presents for the following: Actinic Keratosis (6 month follow up - trunk treated with LN2) and Follow-up. The patient presents for Upper Body Skin Exam (UBSE) for skin cancer screening and mole check.  The following portions of the chart were reviewed this encounter and updated as appropriate:   Tobacco  Allergies  Meds  Problems  Med Hx  Surg Hx  Fam Hx     Review of Systems:  No other skin or systemic complaints except as noted in HPI or Assessment and Plan.  Objective  Well appearing patient in no apparent distress; mood and affect are within normal limits.  All skin waist up examined.  Objective  Face and ears x (5): Erythematous thin papules/macules with gritty scale.   Objective  Mid Back (5): Erythematous keratotic or waxy stuck-on papule or plaque.   Objective  Left sup helix: Well healed excision site with 1 remaining suture.   Assessment & Plan    Purpura - Chronic; persistent and recurrent.  Treatable, but not curable. - Violaceous macules and patches - Benign - Related to trauma, age, sun damage and/or use of blood thinners, chronic use of topical and/or oral steroids - Observe - Can use OTC arnica containing moisturizer such as Dermend Bruise Formula if desired - Call for worsening or other concerns  Lentigines - Scattered tan macules - Due to sun exposure - Benign-appering, observe - Recommend daily broad spectrum sunscreen SPF 30+ to sun-exposed areas, reapply every 2 hours as needed. - Call for any changes  Seborrheic Keratoses - Stuck-on, waxy, tan-brown papules and/or plaques  - Benign-appearing - Discussed benign etiology and prognosis. - Observe - Call for any changes  Melanocytic Nevi - Tan-brown and/or pink-flesh-colored symmetric macules and papules - Benign appearing on exam today - Observation - Call clinic for new or changing moles - Recommend daily use of  broad spectrum spf 30+ sunscreen to sun-exposed areas.   Hemangiomas - Red papules - Discussed benign nature - Observe - Call for any changes  Actinic Damage - Chronic condition, secondary to cumulative UV/sun exposure - diffuse scaly erythematous macules with underlying dyspigmentation - Recommend daily broad spectrum sunscreen SPF 30+ to sun-exposed areas, reapply every 2 hours as needed.  - Staying in the shade or wearing long sleeves, sun glasses (UVA+UVB protection) and wide brim hats (4-inch brim around the entire circumference of the hat) are also recommended for sun protection.  - Call for new or changing lesions.  Skin cancer screening performed today.  History of Basal Cell Carcinoma of the Skin - No evidence of recurrence today - Recommend regular full body skin exams - Recommend daily broad spectrum sunscreen SPF 30+ to sun-exposed areas, reapply every 2 hours as needed.  - Call if any new or changing lesions are noted between office visits  History of Squamous Cell Carcinoma of the Skin - No evidence of recurrence today - No lymphadenopathy - Recommend regular full body skin exams - Recommend daily broad spectrum sunscreen SPF 30+ to sun-exposed areas, reapply every 2 hours as needed.  - Call if any new or changing lesions are noted between office visits  AK (actinic keratosis) (5) Face and ears x5 Destruction of lesion - Face and ears x Complexity: simple   Destruction method: cryotherapy   Informed consent: discussed and consent obtained   Timeout:  patient name, date of birth, surgical site, and procedure verified Lesion destroyed  using liquid nitrogen: Yes   Region frozen until ice ball extended beyond lesion: Yes   Outcome: patient tolerated procedure well with no complications   Post-procedure details: wound care instructions given    Inflamed seborrheic keratosis (5) Mid Back Destruction of lesion - Mid Back Complexity: simple   Destruction method:  cryotherapy   Informed consent: discussed and consent obtained   Timeout:  patient name, date of birth, surgical site, and procedure verified Lesion destroyed using liquid nitrogen: Yes   Region frozen until ice ball extended beyond lesion: Yes   Outcome: patient tolerated procedure well with no complications   Post-procedure details: wound care instructions given    History of SCC (squamous cell carcinoma) of skin Left sup helix  Clear. Observe for recurrence. Call clinic for new or changing lesions.  Recommend regular skin exams, daily broad-spectrum spf 30+ sunscreen use, and photoprotection.    Suture removed today.  Skin cancer screening  Return in about 6 months (around 06/16/2021) for AK follow up.   I, Ashok Cordia, CMA, am acting as scribe for Sarina Ser, MD .  Documentation: I have reviewed the above documentation for accuracy and completeness, and I agree with the above.  Sarina Ser, MD

## 2020-12-21 ENCOUNTER — Encounter: Payer: Self-pay | Admitting: Dermatology

## 2020-12-24 ENCOUNTER — Other Ambulatory Visit: Payer: Medicare HMO

## 2020-12-27 DIAGNOSIS — E785 Hyperlipidemia, unspecified: Secondary | ICD-10-CM | POA: Diagnosis not present

## 2020-12-28 ENCOUNTER — Ambulatory Visit: Payer: Medicare HMO | Admitting: Oncology

## 2020-12-29 ENCOUNTER — Other Ambulatory Visit: Payer: Self-pay

## 2020-12-29 ENCOUNTER — Inpatient Hospital Stay: Payer: Medicare HMO | Attending: Oncology

## 2020-12-29 DIAGNOSIS — D649 Anemia, unspecified: Secondary | ICD-10-CM | POA: Insufficient documentation

## 2020-12-29 DIAGNOSIS — K921 Melena: Secondary | ICD-10-CM | POA: Insufficient documentation

## 2020-12-29 DIAGNOSIS — Z85828 Personal history of other malignant neoplasm of skin: Secondary | ICD-10-CM | POA: Insufficient documentation

## 2020-12-29 DIAGNOSIS — R0602 Shortness of breath: Secondary | ICD-10-CM | POA: Diagnosis not present

## 2020-12-29 DIAGNOSIS — Y842 Radiological procedure and radiotherapy as the cause of abnormal reaction of the patient, or of later complication, without mention of misadventure at the time of the procedure: Secondary | ICD-10-CM | POA: Diagnosis not present

## 2020-12-29 DIAGNOSIS — D7589 Other specified diseases of blood and blood-forming organs: Secondary | ICD-10-CM | POA: Insufficient documentation

## 2020-12-29 DIAGNOSIS — Z79899 Other long term (current) drug therapy: Secondary | ICD-10-CM | POA: Insufficient documentation

## 2020-12-29 DIAGNOSIS — Z923 Personal history of irradiation: Secondary | ICD-10-CM | POA: Diagnosis not present

## 2020-12-29 DIAGNOSIS — Z87891 Personal history of nicotine dependence: Secondary | ICD-10-CM | POA: Insufficient documentation

## 2020-12-29 DIAGNOSIS — Z8546 Personal history of malignant neoplasm of prostate: Secondary | ICD-10-CM | POA: Diagnosis not present

## 2020-12-29 DIAGNOSIS — Z87442 Personal history of urinary calculi: Secondary | ICD-10-CM | POA: Diagnosis not present

## 2020-12-29 LAB — CBC WITH DIFFERENTIAL/PLATELET
Abs Immature Granulocytes: 0.02 10*3/uL (ref 0.00–0.07)
Basophils Absolute: 0 10*3/uL (ref 0.0–0.1)
Basophils Relative: 0 %
Eosinophils Absolute: 0.2 10*3/uL (ref 0.0–0.5)
Eosinophils Relative: 4 %
HCT: 36 % — ABNORMAL LOW (ref 39.0–52.0)
Hemoglobin: 12.8 g/dL — ABNORMAL LOW (ref 13.0–17.0)
Immature Granulocytes: 0 %
Lymphocytes Relative: 15 %
Lymphs Abs: 0.7 10*3/uL (ref 0.7–4.0)
MCH: 36.6 pg — ABNORMAL HIGH (ref 26.0–34.0)
MCHC: 35.6 g/dL (ref 30.0–36.0)
MCV: 102.9 fL — ABNORMAL HIGH (ref 80.0–100.0)
Monocytes Absolute: 0.7 10*3/uL (ref 0.1–1.0)
Monocytes Relative: 14 %
Neutro Abs: 3.2 10*3/uL (ref 1.7–7.7)
Neutrophils Relative %: 67 %
Platelets: 200 10*3/uL (ref 150–400)
RBC: 3.5 MIL/uL — ABNORMAL LOW (ref 4.22–5.81)
RDW: 13.1 % (ref 11.5–15.5)
WBC: 4.8 10*3/uL (ref 4.0–10.5)
nRBC: 0 % (ref 0.0–0.2)

## 2020-12-29 LAB — FERRITIN: Ferritin: 154 ng/mL (ref 24–336)

## 2020-12-29 LAB — IRON AND TIBC
Iron: 91 ug/dL (ref 45–182)
Saturation Ratios: 32 % (ref 17.9–39.5)
TIBC: 287 ug/dL (ref 250–450)
UIBC: 196 ug/dL

## 2020-12-31 ENCOUNTER — Inpatient Hospital Stay: Payer: Medicare HMO

## 2020-12-31 ENCOUNTER — Other Ambulatory Visit: Payer: Self-pay

## 2020-12-31 ENCOUNTER — Inpatient Hospital Stay: Payer: Medicare HMO | Admitting: Oncology

## 2020-12-31 ENCOUNTER — Encounter: Payer: Self-pay | Admitting: Oncology

## 2020-12-31 DIAGNOSIS — Z8546 Personal history of malignant neoplasm of prostate: Secondary | ICD-10-CM | POA: Diagnosis not present

## 2020-12-31 DIAGNOSIS — D649 Anemia, unspecified: Secondary | ICD-10-CM | POA: Diagnosis not present

## 2020-12-31 DIAGNOSIS — D7589 Other specified diseases of blood and blood-forming organs: Secondary | ICD-10-CM | POA: Diagnosis not present

## 2020-12-31 DIAGNOSIS — K921 Melena: Secondary | ICD-10-CM

## 2020-12-31 DIAGNOSIS — Z79899 Other long term (current) drug therapy: Secondary | ICD-10-CM | POA: Diagnosis not present

## 2020-12-31 DIAGNOSIS — C61 Malignant neoplasm of prostate: Secondary | ICD-10-CM

## 2020-12-31 DIAGNOSIS — R0602 Shortness of breath: Secondary | ICD-10-CM | POA: Diagnosis not present

## 2020-12-31 DIAGNOSIS — Z85828 Personal history of other malignant neoplasm of skin: Secondary | ICD-10-CM | POA: Diagnosis not present

## 2020-12-31 DIAGNOSIS — Z87442 Personal history of urinary calculi: Secondary | ICD-10-CM | POA: Diagnosis not present

## 2020-12-31 NOTE — Progress Notes (Signed)
Pt has chronic diarrha which started ever since he had radiation therapy.

## 2020-12-31 NOTE — Progress Notes (Signed)
Hematology/Oncology note [facility] [phone] [phone]   Patient Care Team: Adin Hector, MD as PCP - General (Internal Medicine)  REFERRING PROVIDER: Adin Hector, MD  CHIEF COMPLAINTS/REASON FOR VISIT:  Establish care for hemochromatosis management  HISTORY OF PRESENTING ILLNESS:  Andrew Walsh is a 80 y.o. male who was seen in consultation at the request of Adin Hector, MD for evaluation of elevated ferritin level/hemochromatosis management. Patient reports a history  hereditary hemochromatosis.  He was diagnosed in 21s by his previous primary care provider in [place] and he has been an active blood donor every 8 to 10 weeks for many years.  Recently he was diagnosed with prostate cancer Gleason score 4+3 with a PSA of 9.4 and underwent IMRT radiation by Dr. Baruch Gouty and he finished in September 2020. He is mildly anemic and also cannot further donate blood due to recent radiation and was referred to me for management of hemochromatosis and iron overload.   Context Shortness of breath: Patient reports mild shortness of breath after exertion recently and he attributes to Covid pandemic and not able to be physically active lately. Fatigue: denies He had blood work done at [facility] primary care provider's office recently. 08/19/2019, hemoglobin 14, hematocrit 40.7, MCV 105.7. Iron 254, ferritin 514,  07/18/2019, iron 259, ferritin 476, transferrin 193.5, TIBC 270, iron saturation 96  INTERVAL HISTORY Andrew Walsh is a 80 y.o. male who has above history reviewed by me today presents for follow up visit for management of iron overload/hemochromatosis. Problems and complaints are listed below: Patient reports that he continues to drink alcohol sometimes. His main complaint today is that he has had chronic diarrhea, with rectal bleeding intermittently.  Patient was seen by gastroenterology Dr. [name] on  10/07/2020, his hematochezia was considered to be secondary to radiation-induced proctitis with telangiectasia.  Patient had a colonoscopy with APC on 08/09/2020. Patient was recommended to use Imodium.  As patient continues to have symptoms, he has discussed with Dr. Ricky Stabs team in early April 2022 and was advised to use Carafate enema twice daily, Patient reports that he has been doing that since 12/14/2020 and has not noticed any improvement.  He is frustrated about his symptoms and also his future appointment is not until June 2022.  Denies any fever, chills, abdominal pain.   Review of Systems  Constitutional: Negative for appetite change, chills, fatigue, fever and unexpected weight change.  HENT:   Negative for hearing loss and voice change.   Eyes: Negative for eye problems and icterus.  Respiratory: Negative for chest tightness, cough and shortness of breath.   Cardiovascular: Negative for chest pain and leg swelling.  Gastrointestinal: Positive for blood in stool and diarrhea. Negative for abdominal distention and abdominal pain.  Endocrine: Negative for hot flashes.  Genitourinary: Negative for difficulty urinating, dysuria and frequency.   Musculoskeletal: Negative for arthralgias.  Skin: Negative for itching and rash.  Neurological: Negative for light-headedness and numbness.  Hematological: Negative for adenopathy. Does not bruise/bleed easily.  Psychiatric/Behavioral: Negative for confusion.    MEDICAL HISTORY:  Past Medical History:  Diagnosis Date  . Actinic keratosis 01/31/2016   Left lateral neck post auricular. AK and prurigo nodularis  . Actinic keratosis 01/31/2016   Left lateral neck post auricular, inferior. AK and prurigo nodularis  . Actinic keratosis 12/19/2017   Left ear anti-helix  . Ankle arthropathy 02/24/2014  . Arthritis of foot, degenerative 10/03/2012   Overview:  Subtalar arthritis   . Basal cell carcinoma 04/30/2008   Left mid dorsum nose.  . Basal  cell carcinoma 07/12/2009   Left post. deltoid sup. lat. edge of cancer scar.   . Cancer ([place])    PROSTATE  . Elevated PSA   . Hereditary hemochromatosis ([place]) 11/03/2015   Overview:  Treated by regular blood donation   . History of kidney stones   . HLD (hyperlipidemia) 11/03/2015  . Hyperlipidemia   . Inguinal hernia   . Localized, primary osteoarthritis of ankle or foot 02/24/2014  . Osteoarthritis   . Squamous cell carcinoma of skin 05/17/2010   Left forearm. KA-like pattern. EDC.  Marland Kitchen Squamous cell carcinoma of skin 12/18/2016   Right mid lat. volar forearm.WD SCC. EDC  . Squamous cell carcinoma of skin 01/22/2019   Right lateral distal tricep near elbow.   . Squamous cell carcinoma of skin 07/14/2019   Right forearm. WD SCC. EDC  . Squamous cell carcinoma of skin 09/29/2019   Right proximal mandible. SCCis  . Squamous cell carcinoma of skin 08/17/2020   Left ear sup helix    SURGICAL HISTORY: Past Surgical History:  Procedure Laterality Date  . ANKLE FUSION Right 2001, 2016  . BONE MARROW ASPIRATION    . COLONOSCOPY WITH PROPOFOL N/A 08/09/2020   Procedure: COLONOSCOPY WITH PROPOFOL;  Surgeon: [place], Benay Pike, MD;  Location: [facility] ENDOSCOPY;  Service: Gastroenterology;  Laterality: N/A;  . HYDROCELE EXCISION / REPAIR    . JOINT REPLACEMENT    . REVISION TOTAL HIP ARTHROPLASTY  2006   Right 2011, Left 2006    SOCIAL HISTORY: Social History   Socioeconomic History  . Marital status: Married    Spouse name: Not on file  . Number of children: Not on file  . Years of education: Not on file  . Highest education level: Not on file  Occupational History  . Not on file  Tobacco Use  . Smoking status: Former Smoker    Years: 20.00    Types: Cigarettes    Quit date: 09/10/1979    Years since quitting: 41.3  . Smokeless tobacco: Never Used  Vaping Use  . Vaping Use: Never used  Substance and Sexual Activity  . Alcohol use: Yes    Alcohol/week: 32.0 standard drinks     Types: 16 Glasses of wine, 16 Standard drinks or equivalent per week  . Drug use: Never  . Sexual activity: Not on file  Other Topics Concern  . Not on file  Social History Narrative  . Not on file   Social Determinants of Health   Financial Resource Strain: Not on file  Food Insecurity: Not on file  Transportation Needs: Not on file  Physical Activity: Not on file  Stress: Not on file  Social Connections: Not on file  Intimate Partner Violence: Not on file    FAMILY HISTORY: Family History  Problem Relation Age of Onset  . Bladder Cancer Neg Hx   . Prostate cancer Neg Hx   . Kidney cancer Neg Hx     ALLERGIES:  has No Known Allergies.  MEDICATIONS:  Current Outpatient Medications  Medication Sig Dispense Refill  . Calcium Carbonate-Vitamin D (CALCIUM-VITAMIN D3 PO) Take by mouth.    . Glucosamine 500 MG CAPS Take by mouth.    . loperamide (IMODIUM) 2 MG capsule Take 4 mg by mouth daily.    . Multiple Vitamin (MULTI-VITAMINS) TABS Take by mouth.     . mupirocin ointment (BACTROBAN) 2 %  Apply 1 application topically daily. With dressing changes 22 g 0  . naproxen sodium (ALEVE) 220 MG tablet Take 220 mg by mouth.    . Probiotic Product (BIO-KULT INFANTIS PO) Take by mouth every morning.    . simvastatin (ZOCOR) 20 MG tablet     . sucralfate (CARAFATE) 1 GM/10ML suspension Take 20 mLs (2 g total) by mouth 2 (two) times daily. Dissolve 2 gram in 46mL of tap water.    . vitamin B-12 (CYANOCOBALAMIN) 1000 MCG tablet Take 1 tablet (1,000 mcg total) by mouth daily. 90 tablet 1   No current facility-administered medications for this visit.     PHYSICAL EXAMINATION: ECOG PERFORMANCE STATUS: 1 - Symptomatic but completely ambulatory Vitals:   12/31/20 1317  BP: (!) 152/88  Pulse: 69  Resp: 18  Temp: 99.2 F (37.3 C)   Filed Weights   12/31/20 1317  Weight: 182 lb 1.6 oz (82.6 kg)    Physical Exam Constitutional:      General: He is not in acute  distress. HENT:     Head: Normocephalic and atraumatic.  Eyes:     General: No scleral icterus.    Pupils: Pupils are equal, round, and reactive to light.  Cardiovascular:     Rate and Rhythm: Normal rate and regular rhythm.     Heart sounds: Normal heart sounds.  Pulmonary:     Effort: Pulmonary effort is normal. No respiratory distress.     Breath sounds: No wheezing.  Abdominal:     General: Bowel sounds are normal. There is no distension.     Palpations: Abdomen is soft. There is no mass.     Tenderness: There is no abdominal tenderness.  Musculoskeletal:        General: No swelling or deformity. Normal range of motion.     Cervical back: Normal range of motion and neck supple.  Skin:    General: Skin is warm and dry.     Findings: No erythema or rash.  Neurological:     Mental Status: He is alert and oriented to person, place, and time. Mental status is at baseline.     Cranial Nerves: No cranial nerve deficit.  Psychiatric:        Mood and Affect: Mood normal.      LABORATORY DATA:  I have reviewed the data as listed Lab Results  Component Value Date   WBC 4.8 12/29/2020   HGB 12.8 (L) 12/29/2020   HCT 36.0 (L) 12/29/2020   MCV 102.9 (H) 12/29/2020   PLT 200 12/29/2020   Recent Labs    03/01/20 1319 06/22/20 1102 09/27/20 1141  NA 138  --   --   K 4.4  --   --   CL 102  --   --   CO2 27  --   --   GLUCOSE 94  --   --   BUN 12  --   --   CREATININE 0.89  --   --   CALCIUM 9.2  --   --   GFRNONAA >60  --   --   GFRAA >60  --   --   PROT 7.3 7.2 7.0  ALBUMIN 4.1 4.2 4.0  AST 31 32 26  ALT 29 28 20   ALKPHOS 52 44 44  BILITOT 0.9 1.1 0.7  BILIDIR  --  0.2 0.1  IBILI  --  0.9 0.6   Iron/TIBC/Ferritin/ %Sat    Component Value Date/Time   IRON 91  12/29/2020 1327   TIBC 287 12/29/2020 1327   FERRITIN 154 12/29/2020 1327   IRONPCTSAT 32 12/29/2020 1327      RADIOGRAPHIC STUDIES: I have personally reviewed the radiological images as listed and  agreed with the findings in the report.  No results found.   ASSESSMENT & PLAN:  1. Hereditary hemochromatosis ([place])   2. Malignant neoplasm of prostate ([place])   3. Hematochezia    #History of hereditary hemochromatosis.  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 Labs reviewed and discussed with patient. Patient has mild anemia of 12.8, ferritin 154, iron saturation 32. I will hold off phlebotomy at this point given that iron saturation has improved and normalized, and ongoing hematochezia  Hematochezia, Reviewed gastroenterology note.  It was felt that hematochezia is secondary to the radiation proctitis with telangiectasia. I will forward this note to Dr. Ricky Stabs team.  I recommend patient to follow-up with gastroenterology for further evaluation. Continue sucralfate enema as instructed by GI team  Alcohol cessation was discussed with patient.  #History of prostate cancer, status post radiation.  Follows up with Dr. Leda Quail. Erlene Quan.. 10/08/2020 PSA is <0.1.  Continue follow-up with urology in radiation oncology.  #Macrocytosis 1/29/20201 normal vitamin B12 and folate level. This is likely secondary to chronic alcohol use.  Continue vitamin B12 supplementation.   All questions were answered. The patient knows to call the clinic with any problems questions or concerns. Return of visit: 3 months.  Lab MD, CBC and iron.  TIBC ferritin  Earlie Server, MD, PhD Hematology Oncology [facility] at [facility] Pager- [phone] 12/31/2020

## 2021-01-03 DIAGNOSIS — Z20822 Contact with and (suspected) exposure to covid-19: Secondary | ICD-10-CM | POA: Diagnosis not present

## 2021-01-03 DIAGNOSIS — E785 Hyperlipidemia, unspecified: Secondary | ICD-10-CM | POA: Diagnosis not present

## 2021-01-03 DIAGNOSIS — M19071 Primary osteoarthritis, right ankle and foot: Secondary | ICD-10-CM | POA: Diagnosis not present

## 2021-01-03 DIAGNOSIS — Z0000 Encounter for general adult medical examination without abnormal findings: Secondary | ICD-10-CM | POA: Diagnosis not present

## 2021-01-03 DIAGNOSIS — Z8546 Personal history of malignant neoplasm of prostate: Secondary | ICD-10-CM | POA: Diagnosis not present

## 2021-01-04 ENCOUNTER — Encounter: Payer: Self-pay | Admitting: Internal Medicine

## 2021-01-04 DIAGNOSIS — R152 Fecal urgency: Secondary | ICD-10-CM | POA: Diagnosis not present

## 2021-01-04 DIAGNOSIS — K921 Melena: Secondary | ICD-10-CM | POA: Diagnosis not present

## 2021-01-04 DIAGNOSIS — R159 Full incontinence of feces: Secondary | ICD-10-CM | POA: Diagnosis not present

## 2021-01-04 DIAGNOSIS — K627 Radiation proctitis: Secondary | ICD-10-CM | POA: Diagnosis not present

## 2021-01-04 DIAGNOSIS — Z923 Personal history of irradiation: Secondary | ICD-10-CM | POA: Diagnosis not present

## 2021-01-05 ENCOUNTER — Encounter
Admission: RE | Disposition: A | Discharge status: Home / Self Care | Payer: Self-pay | Source: Home / Self Care | Attending: Internal Medicine

## 2021-01-05 ENCOUNTER — Encounter: Payer: Self-pay | Admitting: Registered Nurse

## 2021-01-05 ENCOUNTER — Ambulatory Visit
Admission: RE | Admit: 2021-01-05 | Discharge: 2021-01-05 | Disposition: A | Discharge status: Home / Self Care | Payer: Medicare HMO | Attending: Internal Medicine | Admitting: Internal Medicine

## 2021-01-05 ENCOUNTER — Encounter: Payer: Self-pay | Admitting: Internal Medicine

## 2021-01-05 DIAGNOSIS — Z79899 Other long term (current) drug therapy: Secondary | ICD-10-CM | POA: Insufficient documentation

## 2021-01-05 DIAGNOSIS — Z8546 Personal history of malignant neoplasm of prostate: Secondary | ICD-10-CM | POA: Insufficient documentation

## 2021-01-05 DIAGNOSIS — K5730 Diverticulosis of large intestine without perforation or abscess without bleeding: Secondary | ICD-10-CM | POA: Insufficient documentation

## 2021-01-05 DIAGNOSIS — Z87442 Personal history of urinary calculi: Secondary | ICD-10-CM | POA: Diagnosis not present

## 2021-01-05 DIAGNOSIS — Z8719 Personal history of other diseases of the digestive system: Secondary | ICD-10-CM | POA: Diagnosis not present

## 2021-01-05 DIAGNOSIS — Z85828 Personal history of other malignant neoplasm of skin: Secondary | ICD-10-CM | POA: Diagnosis not present

## 2021-01-05 DIAGNOSIS — K627 Radiation proctitis: Secondary | ICD-10-CM | POA: Diagnosis not present

## 2021-01-05 DIAGNOSIS — K921 Melena: Secondary | ICD-10-CM | POA: Diagnosis not present

## 2021-01-05 DIAGNOSIS — E785 Hyperlipidemia, unspecified: Secondary | ICD-10-CM | POA: Insufficient documentation

## 2021-01-05 DIAGNOSIS — K5520 Angiodysplasia of colon without hemorrhage: Secondary | ICD-10-CM | POA: Insufficient documentation

## 2021-01-05 HISTORY — PX: FLEXIBLE SIGMOIDOSCOPY: SHX5431

## 2021-01-05 SURGERY — SIGMOIDOSCOPY, FLEXIBLE
Anesthesia: General

## 2021-01-05 MED ORDER — SODIUM CHLORIDE 0.9 % IV SOLN
INTRAVENOUS | Status: DC
  Administered 2021-01-05: 1000 mL via INTRAVENOUS

## 2021-01-05 NOTE — H&P (Signed)
Outpatient short stay form Pre-procedure 01/05/2021 2:58 PM Andrew Walsh K. [name], M.D.  Primary Physician: Ramonita Lab III, M.D.  Reason for visit:  Hematochezia, history of radiation proctitis, history of distal rectal ulcer.  History of present illness: AS above. Patient has recurrent painless small volume hematochezia with stable hemoglobin. This has been a significant distress to the patient however, due to staining of undergarments and interruption of daily activities.    Current Facility-Administered Medications:  .  0.9 %  sodium chloride infusion, , Intravenous, Continuous, [place], Benay Pike, MD, Last Rate: 20 mL/hr at 01/05/21 1446, 1,000 mL at 01/05/21 1446  Medications Prior to Admission  Medication Sig Dispense Refill Last Dose  . Calcium Carbonate-Vitamin D (CALCIUM-VITAMIN D3 PO) Take by mouth.   Past Week at Unknown time  . Glucosamine 500 MG CAPS Take by mouth.   Past Week at Unknown time  . loperamide (IMODIUM) 2 MG capsule Take 4 mg by mouth daily.   Past Week at Unknown time  . Multiple Vitamin (MULTI-VITAMINS) TABS Take by mouth.    Past Week at Unknown time  . mupirocin ointment (BACTROBAN) 2 % Apply 1 application topically daily. With dressing changes 22 g 0 Past Week at Unknown time  . naproxen sodium (ALEVE) 220 MG tablet Take 220 mg by mouth.   Past Month at Unknown time  . Probiotic Product (BIO-KULT INFANTIS PO) Take by mouth every morning.   Past Week at Unknown time  . simvastatin (ZOCOR) 20 MG tablet    Past Week at Unknown time  . sucralfate (CARAFATE) 1 GM/10ML suspension Take 20 mLs (2 g total) by mouth 2 (two) times daily. Dissolve 2 gram in 35mL of tap water.   Past Week at Unknown time  . vitamin B-12 (CYANOCOBALAMIN) 1000 MCG tablet Take 1 tablet (1,000 mcg total) by mouth daily. 90 tablet 1 Past Week at Unknown time  . predniSONE (DELTASONE) 10 MG tablet Take 10 mg by mouth daily with breakfast. (Patient not taking: Reported on 01/05/2021)   Not Taking at  Unknown time     No Known Allergies   Past Medical History:  Diagnosis Date  . Actinic keratosis 01/31/2016   Left lateral neck post auricular. AK and prurigo nodularis  . Actinic keratosis 01/31/2016   Left lateral neck post auricular, inferior. AK and prurigo nodularis  . Actinic keratosis 12/19/2017   Left ear anti-helix  . Ankle arthropathy 02/24/2014  . Arthritis of foot, degenerative 10/03/2012   Overview:  Subtalar arthritis   . Basal cell carcinoma 04/30/2008   Left mid dorsum nose.  . Basal cell carcinoma 07/12/2009   Left post. deltoid sup. lat. edge of cancer scar.   . Cancer ([place])    PROSTATE  . Elevated PSA   . Hereditary hemochromatosis ([place]) 11/03/2015   Overview:  Treated by regular blood donation   . History of kidney stones   . HLD (hyperlipidemia) 11/03/2015  . Hyperlipidemia   . Inguinal hernia   . Localized, primary osteoarthritis of ankle or foot 02/24/2014  . Osteoarthritis   . Squamous cell carcinoma of skin 05/17/2010   Left forearm. KA-like pattern. EDC.  Marland Kitchen Squamous cell carcinoma of skin 12/18/2016   Right mid lat. volar forearm.WD SCC. EDC  . Squamous cell carcinoma of skin 01/22/2019   Right lateral distal tricep near elbow.   . Squamous cell carcinoma of skin 07/14/2019   Right forearm. WD SCC. EDC  . Squamous cell carcinoma of skin 09/29/2019   Right proximal  mandible. SCCis  . Squamous cell carcinoma of skin 08/17/2020   Left ear sup helix    Review of systems:  Otherwise negative.    Physical Exam  Gen: Alert, oriented. Appears stated age.  HEENT: [place]/AT. PERRLA. Lungs: CTA, no wheezes. CV: RR nl S1, S2. Abd: soft, benign, no masses. BS+ Ext: No edema. Pulses 2+    Planned procedures: Proceed with flexible sigmoidoscopy with APC hemostasis. The patient understands the nature of the planned procedure, indications, risks, alternatives and potential complications including but not limited to bleeding, infection, perforation, damage  to internal organs and possible oversedation/side effects from anesthesia. The patient agrees and gives consent to proceed.  Please refer to procedure notes for findings, recommendations and patient disposition/instructions.     Andrew Walsh K. [name], M.D. Gastroenterology 01/05/2021  2:58 PM

## 2021-01-05 NOTE — Interval H&P Note (Signed)
History and Physical Interval Note:  01/05/2021 3:00 PM  Andrew Walsh  has presented today for surgery, with the diagnosis of Radiation proctitis, fecal incontinence and urgency.  The various methods of treatment have been discussed with the patient and family. After consideration of risks, benefits and other options for treatment, the patient has consented to  Procedure(s): FLEXIBLE SIGMOIDOSCOPY (N/A) as a surgical intervention.  The patient's history has been reviewed, patient examined, no change in status, stable for surgery.  I have reviewed the patient's chart and labs.  Questions were answered to the patient's satisfaction.     [place], [place]

## 2021-01-05 NOTE — Op Note (Signed)
[facility] Gastroenterology Patient Name: Andrew Walsh Procedure Date: 01/05/2021 2:53 PM MRN: [id number] Account #: [id number] Date of Birth: 1941-05-10 Admit Type: Outpatient Age: 80 Room: [facility] ENDO ROOM 2 Gender: Male Note Status: Finalized Procedure:             Flexible Sigmoidoscopy Indications:           Hematochezia, Arteriovenous malformation in the large                         intestine, Radiation proctitis Providers:             Benay Pike. Glorie Dowlen MD, MD Medicines:             None Complications:         No immediate complications. Estimated blood loss:                         Minimal. Procedure:             Pre-Anesthesia Assessment:                        - The risks and benefits of the procedure and the                         sedation options and risks were discussed with the                         patient. All questions were answered and informed                         consent was obtained.                        - Patient identification and proposed procedure were                         verified prior to the procedure by the nurse. The                         procedure was verified in the procedure room.                        - ASA Grade Assessment: III - A patient with severe                         systemic disease.                        - After reviewing the risks and benefits, the patient                         was deemed in satisfactory condition to undergo the                         procedure.                        - Prior to the procedure, no anesthesia or sedation  was planned.                        - The risks and benefits of the procedure and the                         sedation options and risks were discussed with the                         patient. All questions were answered and informed                         consent was obtained.                        - Patient identification and proposed  procedure were                         verified prior to the procedure by the nurse. The                         procedure was verified in the procedure room.                        After obtaining informed consent, the scope was passed                         under direct vision. The Endoscope was introduced                         through the and advanced to the distal sigmoid colon.                         The flexible sigmoidoscopy was somewhat difficult due                         to the patient's discomfort during the procedure as                         well as multiple bleeding telangiectasias requiring                         hemostasis. [Solution]. The quality of the bowel                         preparation was good. The flexible sigmoidoscopy was                         somewhat difficult due to excessive bleeding.                         Successful completion of the procedure was aided by                         performing the maneuvers documented (below) in this                         report. The quality of the bowel preparation was good. Findings:  The perianal and digital rectal examinations were normal. Pertinent       negatives include normal sphincter tone.      A few medium-mouthed diverticula were found in the sigmoid colon.      Numerous telangiectasias were noted in the distal rectum compatible with       radiation proctitis. Stigmata of active, slow bleeding was noted       emanating from numerous telangiectasias. Coagulation for hemostasis       using argon plasma at 0.8 liters/minute and 20 watts was successful.       Estimated blood loss was minimal. Impression:            - Diverticulosis in the sigmoid colon.                        - No specimens collected. Recommendation:        - Discharge patient to home with family.                        - Patient has a contact number available for                         emergencies. The signs and symptoms of  potential                         delayed complications were discussed with the patient.                         Return to normal activities tomorrow. Written                         discharge instructions were provided to the patient.                        - Advance diet as tolerated.                        - Return to physician assistant in 2 weeks.                        - The findings and recommendations were discussed with                         the patient.                        - If bleeding not improved or resolved, consider                         referral for cryotherapy of telengiectasias.                        - The findings and recommendations were discussed with                         the patient. Procedure Code(s):     --- Professional ---                        [phone], Sigmoidoscopy, flexible; with control of  bleeding, any method Diagnosis Code(s):     --- Professional ---                        K57.30, Diverticulosis of large intestine without                         perforation or abscess without bleeding                        K55.20, Angiodysplasia of colon without hemorrhage                        K92.1, Melena (includes Hematochezia) CPT copyright 2019 American Medical Association. All rights reserved. The codes documented in this report are preliminary and upon coder review may  be revised to meet current compliance requirements. Attending Participation:      I personally performed the entire procedure. Efrain Sella MD, MD 01/05/2021 3:44:34 PM This report has been signed electronically. Number of Addenda: 0 Note Initiated On: 01/05/2021 2:53 PM Total Procedure Duration: 0 hours 17 minutes 7 seconds  Estimated Blood Loss:  Estimated blood loss was minimal. Estimated blood loss                         was minimal.      [facility]

## 2021-01-06 ENCOUNTER — Encounter: Payer: Self-pay | Admitting: Internal Medicine

## 2021-01-11 DIAGNOSIS — Z20822 Contact with and (suspected) exposure to covid-19: Secondary | ICD-10-CM | POA: Diagnosis not present

## 2021-02-10 DIAGNOSIS — K921 Melena: Secondary | ICD-10-CM | POA: Diagnosis not present

## 2021-02-10 DIAGNOSIS — Z923 Personal history of irradiation: Secondary | ICD-10-CM | POA: Diagnosis not present

## 2021-02-10 DIAGNOSIS — C61 Malignant neoplasm of prostate: Secondary | ICD-10-CM | POA: Diagnosis not present

## 2021-02-10 DIAGNOSIS — K627 Radiation proctitis: Secondary | ICD-10-CM | POA: Diagnosis not present

## 2021-03-09 ENCOUNTER — Encounter: Admission: RE | Payer: Self-pay | Source: Home / Self Care

## 2021-03-09 ENCOUNTER — Ambulatory Visit: Admission: RE | Admit: 2021-03-09 | Payer: Medicare HMO | Source: Home / Self Care | Admitting: Internal Medicine

## 2021-03-09 SURGERY — SIGMOIDOSCOPY, FLEXIBLE
Anesthesia: General

## 2021-03-23 ENCOUNTER — Encounter: Payer: Self-pay | Admitting: Internal Medicine

## 2021-03-23 ENCOUNTER — Encounter
Admission: RE | Disposition: A | Discharge status: Home / Self Care | Payer: Self-pay | Source: Home / Self Care | Attending: Internal Medicine

## 2021-03-23 ENCOUNTER — Ambulatory Visit: Payer: Medicare HMO | Admitting: Anesthesiology

## 2021-03-23 ENCOUNTER — Ambulatory Visit
Admission: RE | Admit: 2021-03-23 | Discharge: 2021-03-23 | Disposition: A | Discharge status: Home / Self Care | Payer: Medicare HMO | Attending: Internal Medicine | Admitting: Internal Medicine

## 2021-03-23 DIAGNOSIS — K5730 Diverticulosis of large intestine without perforation or abscess without bleeding: Secondary | ICD-10-CM | POA: Diagnosis not present

## 2021-03-23 DIAGNOSIS — K627 Radiation proctitis: Secondary | ICD-10-CM | POA: Diagnosis not present

## 2021-03-23 DIAGNOSIS — K5521 Angiodysplasia of colon with hemorrhage: Secondary | ICD-10-CM | POA: Diagnosis not present

## 2021-03-23 DIAGNOSIS — Z79899 Other long term (current) drug therapy: Secondary | ICD-10-CM | POA: Diagnosis not present

## 2021-03-23 DIAGNOSIS — Z85828 Personal history of other malignant neoplasm of skin: Secondary | ICD-10-CM | POA: Diagnosis not present

## 2021-03-23 DIAGNOSIS — K921 Melena: Secondary | ICD-10-CM | POA: Diagnosis present

## 2021-03-23 HISTORY — PX: FLEXIBLE SIGMOIDOSCOPY: SHX5431

## 2021-03-23 SURGERY — SIGMOIDOSCOPY, FLEXIBLE
Anesthesia: General

## 2021-03-23 MED ORDER — FENTANYL CITRATE (PF) 100 MCG/2ML IJ SOLN
INTRAMUSCULAR | Status: AC
  Filled 2021-03-23: qty 2

## 2021-03-23 MED ORDER — SODIUM CHLORIDE 0.9 % IV SOLN: INTRAVENOUS | Status: DC

## 2021-03-23 MED ORDER — GLYCOPYRROLATE 0.2 MG/ML IJ SOLN
INTRAMUSCULAR | Status: AC
  Filled 2021-03-23: qty 2

## 2021-03-23 MED ORDER — PROPOFOL 500 MG/50ML IV EMUL
INTRAVENOUS | Status: DC | PRN
  Administered 2021-03-23: 120 ug/kg/min via INTRAVENOUS

## 2021-03-23 MED ORDER — EPHEDRINE SULFATE 50 MG/ML IJ SOLN
INTRAMUSCULAR | Status: DC | PRN
  Administered 2021-03-23 (×2): 10 mg via INTRAVENOUS

## 2021-03-23 MED ORDER — PROPOFOL 500 MG/50ML IV EMUL
INTRAVENOUS | Status: AC
  Filled 2021-03-23: qty 50

## 2021-03-23 MED ORDER — EPHEDRINE 5 MG/ML INJ
INTRAVENOUS | Status: AC
  Filled 2021-03-23: qty 10

## 2021-03-23 MED ORDER — PROPOFOL 10 MG/ML IV BOLUS
INTRAVENOUS | Status: AC
  Filled 2021-03-23: qty 20

## 2021-03-23 MED ORDER — FENTANYL CITRATE (PF) 100 MCG/2ML IJ SOLN
INTRAMUSCULAR | Status: DC | PRN
  Administered 2021-03-23 (×2): 25 ug via INTRAVENOUS
  Administered 2021-03-23: 50 ug via INTRAVENOUS

## 2021-03-23 MED ORDER — GLYCOPYRROLATE 0.2 MG/ML IJ SOLN
INTRAMUSCULAR | Status: DC | PRN
  Administered 2021-03-23: .2 mg via INTRAVENOUS

## 2021-03-23 NOTE — H&P (Signed)
Outpatient short stay form Pre-procedure 03/23/2021 8:12 AM Andrew Walsh K. [name], M.D.  Primary Physician: Ramonita Lab III, M.D.  Reason for visit:  Hematochezia, radiation proctitis.  History of present illness:  80 y/o male with history of radiation proctitis has recurrent hematochezia despite two previous treatments with APC. He presents as well with some diarrhea which is chronic for over a year.   No current facility-administered medications for this encounter.  Medications Prior to Admission  Medication Sig Dispense Refill Last Dose   Calcium Carbonate-Vitamin D (CALCIUM-VITAMIN D3 PO) Take by mouth.      Glucosamine 500 MG CAPS Take by mouth.      loperamide (IMODIUM) 2 MG capsule Take 4 mg by mouth daily.      Multiple Vitamin (MULTI-VITAMINS) TABS Take by mouth.       mupirocin ointment (BACTROBAN) 2 % Apply 1 application topically daily. With dressing changes 22 g 0    naproxen sodium (ALEVE) 220 MG tablet Take 220 mg by mouth.      predniSONE (DELTASONE) 10 MG tablet Take 10 mg by mouth daily with breakfast. (Patient not taking: Reported on 01/05/2021)      Probiotic Product (BIO-KULT INFANTIS PO) Take by mouth every morning.      simvastatin (ZOCOR) 20 MG tablet       sucralfate (CARAFATE) 1 GM/10ML suspension Take 20 mLs (2 g total) by mouth 2 (two) times daily. Dissolve 2 gram in 26mL of tap water.      vitamin B-12 (CYANOCOBALAMIN) 1000 MCG tablet Take 1 tablet (1,000 mcg total) by mouth daily. 90 tablet 1      No Known Allergies   Past Medical History:  Diagnosis Date   Actinic keratosis 01/31/2016   Left lateral neck post auricular. AK and prurigo nodularis   Actinic keratosis 01/31/2016   Left lateral neck post auricular, inferior. AK and prurigo nodularis   Actinic keratosis 12/19/2017   Left ear anti-helix   Ankle arthropathy 02/24/2014   Arthritis of foot, degenerative 10/03/2012   Overview:  Subtalar arthritis    Basal cell carcinoma 04/30/2008   Left mid  dorsum nose.   Basal cell carcinoma 07/12/2009   Left post. deltoid sup. lat. edge of cancer scar.    Cancer ([place])    PROSTATE   Elevated PSA    Hereditary hemochromatosis ([place]) 11/03/2015   Overview:  Treated by regular blood donation    History of kidney stones    HLD (hyperlipidemia) 11/03/2015   Hyperlipidemia    Inguinal hernia    Localized, primary osteoarthritis of ankle or foot 02/24/2014   Osteoarthritis    Squamous cell carcinoma of skin 05/17/2010   Left forearm. KA-like pattern. EDC.   Squamous cell carcinoma of skin 12/18/2016   Right mid lat. volar forearm.WD SCC. EDC   Squamous cell carcinoma of skin 01/22/2019   Right lateral distal tricep near elbow.    Squamous cell carcinoma of skin 07/14/2019   Right forearm. WD SCC. EDC   Squamous cell carcinoma of skin 09/29/2019   Right proximal mandible. SCCis   Squamous cell carcinoma of skin 08/17/2020   Left ear sup helix    Review of systems:  Otherwise negative.    Physical Exam  Gen: Alert, oriented. Appears stated age.  HEENT: [place]/AT. PERRLA. Lungs: CTA, no wheezes. CV: RR nl S1, S2. Abd: soft, benign, no masses. BS+ Ext: No edema. Pulses 2+    Planned procedures: Proceed with flexible sigmoidoscopy with cryotherapy. The patient understands the  nature of the planned procedure, indications, risks, alternatives and potential complications including but not limited to bleeding, infection, perforation, damage to internal organs and possible oversedation/side effects from anesthesia. The patient agrees and gives consent to proceed.  Please refer to procedure notes for findings, recommendations and patient disposition/instructions.     Caley Volkert K. [name], M.D. Gastroenterology 03/23/2021  8:12 AM

## 2021-03-23 NOTE — Op Note (Signed)
[facility] Gastroenterology Patient Name: Andrew Walsh Procedure Date: 03/23/2021 7:50 AM MRN: [id number] Account #: [id number] Date of Birth: 1941-02-16 Admit Type: Outpatient Age: 80 Room: [facility] ENDO ROOM 4 Gender: Male Note Status: Finalized Procedure:             Flexible Sigmoidoscopy Indications:           Hematochezia, Radiation proctitis Providers:             Boykin Nearing. Norma Fredrickson MD, MD Referring MD:          Daniel Nones, MD (Referring MD) Medicines:             Propofol per Anesthesia Complications:         No immediate complications. Estimated blood loss:                         Minimal. Procedure:             Pre-Anesthesia Assessment:                        - The risks and benefits of the procedure and the                         sedation options and risks were discussed with the                         patient. All questions were answered and informed                         consent was obtained.                        - Patient identification and proposed procedure were                         verified prior to the procedure by the nurse. The                         procedure was verified in the procedure room.                        - ASA Grade Assessment: III - A patient with severe                         systemic disease.                        - After reviewing the risks and benefits, the patient                         was deemed in satisfactory condition to undergo the                         procedure.                        After obtaining informed consent, the scope was passed                         under direct vision. The Endoscope was introduced  through the anus and advanced to the the sigmoid                         colon. The flexible sigmoidoscopy was somewhat                         difficult due to multiple diverticula in the colon.                         The quality of the bowel preparation was  adequate. Findings:      The perianal and digital rectal examinations were normal. Pertinent       negatives include normal sphincter tone and no palpable rectal lesions.      Multiple medium-sized localized angioectasias with bleeding on contact       were found in the distal rectum. The decision was made to ablate the       radiation proctitis in the rectum with spray cryotherapy. Endoscopic       visualization identified the ablation site, which was circumferential. A       guidewire was placed, and the endoscope was removed. Ventilation tubing       was inserted over the guidewire, and the wire removed. The endoscope was       reinserted and the ablation catheter was inserted via the working       channel. A total of two sites were ablated. Liquid nitrogen cryogen was       applied for 10 seconds after the first appearance of a frosting effect.       Suction-aided ventilation of gases through the ventilation tubing       continued during therapy and for 20 seconds thereafter. The ablated area       was allowed to thaw for 15 seconds. Ablation was repeated in a likewise       fashion at each site for a total of four cycles. No complications was       observed at the conclusion of therapy. The ventilation tubing was       removed. The endoscope was reinserted and the anatomical areas where the       abnormal mucosa had been ablated were examined. A good ablation effect       was observed. There was no unablated abnormal mucosa present. There was       no bleeding. The patient had no complications. There was no evidence for       perforation. Estimated blood loss was minimal.      The exam was otherwise without abnormality. Impression:            - Multiple colonic angioectasias. Ablated with spray                         cryotherapy using liquid nitrogen.                        - The examination was otherwise normal.                        - No specimens collected. Recommendation:         - The patient will be observed post-procedure, until  all discharge criteria are met.                        - Patient has a contact number available for                         emergencies. The signs and symptoms of potential                         delayed complications were discussed with the patient.                         Return to normal activities tomorrow. Written                         discharge instructions were provided to the patient.                        - Mechanical soft diet for 2 days, then advance as                         tolerated to resume regular diet.                        - Retreatment in 6-8 weeks as needed.                        - Return to my office in 1 month.                        - The findings and recommendations were discussed with                         the patient. Procedure Code(s):     --- Professional ---                        [phone], Sigmoidoscopy, flexible; with ablation of                         tumor(s), polyp(s), or other lesion(s) (includes pre-                         and post-dilation and guide wire passage, when                         performed) Diagnosis Code(s):     --- Professional ---                        K92.1, Melena (includes Hematochezia)                        K55.20, Angiodysplasia of colon without hemorrhage CPT copyright 2019 American Medical Association. All rights reserved. The codes documented in this report are preliminary and upon coder review may  be revised to meet current compliance requirements. Efrain Sella MD, MD 03/23/2021 9:22:01 AM This report has been signed electronically. Number of Addenda: 0 Note Initiated On: 03/23/2021 7:50 AM Total Procedure Duration: 0 hours 43 minutes 16 seconds  Estimated Blood Loss:  Estimated blood loss  was minimal.      [facility]

## 2021-03-23 NOTE — Interval H&P Note (Signed)
History and Physical Interval Note:  03/23/2021 8:14 AM  Andrew Walsh  has presented today for surgery, with the diagnosis of [place].  The various methods of treatment have been discussed with the patient and family. After consideration of risks, benefits and other options for treatment, the patient has consented to  Procedure(s) with comments: FLEXIBLE SIGMOIDOSCOPY (N/A) - CRYOTHERAPY PATIENT as a surgical intervention.  The patient's history has been reviewed, patient examined, no change in status, stable for surgery.  I have reviewed the patient's chart and labs.  Questions were answered to the patient's satisfaction.     [place], [place]

## 2021-03-23 NOTE — Transfer of Care (Signed)
Immediate Anesthesia Transfer of Care Note  Patient: Andrew Walsh  Procedure(s) Performed: FLEXIBLE SIGMOIDOSCOPY  Patient Location: PACU  Anesthesia Type:General  Level of Consciousness: awake and sedated  Airway & Oxygen Therapy: Patient Spontanous Breathing and Patient connected to nasal cannula oxygen  Post-op Assessment: Report given to RN and Post -op Vital signs reviewed and stable  Post vital signs: Reviewed and stable  Last Vitals:  Vitals Value Taken Time  BP    Temp    Pulse    Resp    SpO2      Last Pain:  Vitals:   03/23/21 0757  TempSrc: Temporal  PainSc: 0-No pain         Complications: No notable events documented.

## 2021-03-23 NOTE — Anesthesia Preprocedure Evaluation (Signed)
Anesthesia Evaluation  Patient identified by MRN, date of birth, ID band Patient awake    Reviewed: Allergy & Precautions, NPO status , Patient's Chart, lab work & pertinent test results  History of Anesthesia Complications Negative for: history of anesthetic complications  Airway Mallampati: II       Dental  (+) Teeth Intact   Pulmonary neg sleep apnea, neg COPD, Not current smoker, former smoker,    Pulmonary exam normal        Cardiovascular (-) hypertension(-) Past MI and (-) CHF Normal cardiovascular exam(-) dysrhythmias (-) Valvular Problems/Murmurs     Neuro/Psych neg Seizures    GI/Hepatic Neg liver ROS, neg GERD  ,  Endo/Other  neg diabetes  Renal/GU negative Renal ROS     Musculoskeletal  (+) Arthritis , Osteoarthritis,    Abdominal   Peds  Hematology   Anesthesia Other Findings Actinic keratosis 01/31/2016 Left lateral neck post auricular. AK and prurigo nodularis  Actinic keratosis 01/31/2016 Left lateral neck post auricular, inferior. AK and prurigo nodularis  Actinic keratosis 12/19/2017 Left ear anti-helix  Ankle arthropathy 02/24/2014   Arthritis of foot, degenerative 10/03/2012 Overview: Subtalar arthritis   Basal cell carcinoma 04/30/2008 Left mid dorsum nose.  Basal cell carcinoma 07/12/2009 Left post. deltoid sup. lat. edge of cancer scar.   Cancer ([place])  PROSTATE  Elevated PSA    Hereditary hemochromatosis ([place]) 11/03/2015 Overview: Treated by regular blood donation   History of kidney stones   HLD (hyperlipidemia) 11/03/2015  Hyperlipidemia    Inguinal hernia    Localized, primary osteoarthritis of ankle or foot 02/24/2014   Osteoarthritis    Squamous cell carcinoma of skin 05/17/2010 Left forearm. KA-like pattern. EDC.     Reproductive/Obstetrics                            Anesthesia Physical  Anesthesia Plan  ASA: 2  Anesthesia Plan: General   Post-op  Pain Management:    Induction: Intravenous  PONV Risk Score and Plan: 2 and Propofol infusion and TIVA  Airway Management Planned: Nasal Cannula and Natural Airway  Additional Equipment:   Intra-op Plan:   Post-operative Plan:   Informed Consent: I have reviewed the patients History and Physical, chart, labs and discussed the procedure including the risks, benefits and alternatives for the proposed anesthesia with the patient or authorized representative who has indicated his/her understanding and acceptance.       Plan Discussed with: CRNA, Anesthesiologist and Surgeon  Anesthesia Plan Comments:        Anesthesia Quick Evaluation

## 2021-03-23 NOTE — Anesthesia Procedure Notes (Signed)
Date/Time: 03/23/2021 8:28 AM Performed by: Vaughan Sine Pre-anesthesia Checklist: Patient identified, Emergency Drugs available, Suction available, Patient being monitored and Timeout performed Patient Re-evaluated:Patient Re-evaluated prior to induction Oxygen Delivery Method: Nasal cannula Preoxygenation: Pre-oxygenation with 100% oxygen Induction Type: IV induction Placement Confirmation: positive ETCO2 and CO2 detector

## 2021-03-23 NOTE — Anesthesia Postprocedure Evaluation (Signed)
Anesthesia Post Note  Patient: Andrew Walsh  Procedure(s) Performed: [place]  Patient location during evaluation: Phase II Anesthesia Type: General Level of consciousness: awake and alert, awake and oriented Pain management: pain level controlled Vital Signs Assessment: post-procedure vital signs reviewed and stable Respiratory status: spontaneous breathing, nonlabored ventilation and respiratory function stable Cardiovascular status: blood pressure returned to baseline and stable Postop Assessment: no apparent nausea or vomiting Anesthetic complications: no   No notable events documented.   Last Vitals:  Vitals:   03/23/21 0920 03/23/21 0930  BP: 111/71 122/65  Pulse: 85 81  Resp: (!) 22 (!) 23  Temp:    SpO2: 96% 92%    Last Pain:  Vitals:   03/23/21 0910  TempSrc: Temporal  PainSc:                  Phill Mutter

## 2021-03-24 ENCOUNTER — Encounter: Payer: Self-pay | Admitting: Internal Medicine

## 2021-04-04 ENCOUNTER — Inpatient Hospital Stay: Payer: Medicare HMO | Attending: Oncology

## 2021-04-04 DIAGNOSIS — Z79899 Other long term (current) drug therapy: Secondary | ICD-10-CM | POA: Insufficient documentation

## 2021-04-04 DIAGNOSIS — Z8546 Personal history of malignant neoplasm of prostate: Secondary | ICD-10-CM | POA: Diagnosis not present

## 2021-04-04 DIAGNOSIS — D7589 Other specified diseases of blood and blood-forming organs: Secondary | ICD-10-CM | POA: Diagnosis not present

## 2021-04-04 DIAGNOSIS — Z923 Personal history of irradiation: Secondary | ICD-10-CM | POA: Insufficient documentation

## 2021-04-04 DIAGNOSIS — D649 Anemia, unspecified: Secondary | ICD-10-CM | POA: Diagnosis not present

## 2021-04-04 LAB — CBC WITH DIFFERENTIAL/PLATELET
Abs Immature Granulocytes: 0.03 10*3/uL (ref 0.00–0.07)
Basophils Absolute: 0 10*3/uL (ref 0.0–0.1)
Basophils Relative: 1 %
Eosinophils Absolute: 0.1 10*3/uL (ref 0.0–0.5)
Eosinophils Relative: 2 %
HCT: 39.4 % (ref 39.0–52.0)
Hemoglobin: 13.6 g/dL (ref 13.0–17.0)
Immature Granulocytes: 1 %
Lymphocytes Relative: 25 %
Lymphs Abs: 1.5 10*3/uL (ref 0.7–4.0)
MCH: 35.8 pg — ABNORMAL HIGH (ref 26.0–34.0)
MCHC: 34.5 g/dL (ref 30.0–36.0)
MCV: 103.7 fL — ABNORMAL HIGH (ref 80.0–100.0)
Monocytes Absolute: 0.6 10*3/uL (ref 0.1–1.0)
Monocytes Relative: 10 %
Neutro Abs: 3.8 10*3/uL (ref 1.7–7.7)
Neutrophils Relative %: 61 %
Platelets: 239 10*3/uL (ref 150–400)
RBC: 3.8 MIL/uL — ABNORMAL LOW (ref 4.22–5.81)
RDW: 12.5 % (ref 11.5–15.5)
WBC: 6 10*3/uL (ref 4.0–10.5)
nRBC: 0 % (ref 0.0–0.2)

## 2021-04-04 LAB — IRON AND TIBC
Iron: 244 ug/dL — ABNORMAL HIGH (ref 45–182)
Saturation Ratios: 85 % — ABNORMAL HIGH (ref 17.9–39.5)
TIBC: 288 ug/dL (ref 250–450)
UIBC: 44 ug/dL

## 2021-04-04 LAB — FERRITIN: Ferritin: 242 ng/mL (ref 24–336)

## 2021-04-06 ENCOUNTER — Inpatient Hospital Stay: Payer: Medicare HMO | Admitting: Oncology

## 2021-04-06 ENCOUNTER — Inpatient Hospital Stay: Payer: Medicare HMO

## 2021-04-06 ENCOUNTER — Encounter: Payer: Self-pay | Admitting: Oncology

## 2021-04-06 VITALS — BP 127/80 | HR 68 | Temp 97.8°F | Resp 18 | Wt 164.0 lb

## 2021-04-06 DIAGNOSIS — D649 Anemia, unspecified: Secondary | ICD-10-CM | POA: Diagnosis not present

## 2021-04-06 DIAGNOSIS — C61 Malignant neoplasm of prostate: Secondary | ICD-10-CM | POA: Diagnosis not present

## 2021-04-06 DIAGNOSIS — Z8546 Personal history of malignant neoplasm of prostate: Secondary | ICD-10-CM | POA: Diagnosis not present

## 2021-04-06 DIAGNOSIS — D7589 Other specified diseases of blood and blood-forming organs: Secondary | ICD-10-CM | POA: Diagnosis not present

## 2021-04-06 DIAGNOSIS — M4727 Other spondylosis with radiculopathy, lumbosacral region: Secondary | ICD-10-CM | POA: Diagnosis not present

## 2021-04-06 DIAGNOSIS — Z923 Personal history of irradiation: Secondary | ICD-10-CM | POA: Diagnosis not present

## 2021-04-06 DIAGNOSIS — Z79899 Other long term (current) drug therapy: Secondary | ICD-10-CM | POA: Diagnosis not present

## 2021-04-06 NOTE — Progress Notes (Addendum)
Hematology/Oncology note [facility] [phone] [phone]   Patient Care Team: Adin Hector, MD as PCP - General (Internal Medicine)  REFERRING PROVIDER: Adin Hector, MD  CHIEF COMPLAINTS/REASON FOR VISIT:  Establish care for hemochromatosis management  HISTORY OF PRESENTING ILLNESS:  Mr. Hickam is an 80 year old male who is followed by Dr. Tasia Catchings for management of elevated ferritin/hemochromatosis.  Patient has longstanding history of hereditary hemochromatosis and was diagnosed in the 22s by a provider in [place].  He has been an active blood donor every 8 to 10 weeks for many years.  He has a history of prostate cancer with a PSA of 9.4 and underwent IMRT with Dr. Donella Stade which she completed in September 2020.   He is no longer eligible for blood donation secondary to mild anemia and recent treatment for prostate cancer.  Phlebotomy was held during his last visit secondary to hematochezia.  He was referred back to GI for evaluation.  Patient was seen by gastroenterology Dr. [name] on 10/07/2020, his hematochezia was considered to be secondary to radiation-induced proctitis with telangiectasia.  Patient had a colonoscopy with [facility] on 08/09/2020.Patient was recommended to use Imodium.  As patient continues to have symptoms, he has discussed with Dr. Ricky Stabs team in early April 2022 and was advised to use Carafate enema twice daily. Patient reports that he has been doing that since 12/14/2020 and has not noticed any improvement.   INTERVAL HISTORY-Mr. Fielder last had a phlebotomy in December 2021.  He met with Dr. [name] this month and had a repeat flexible sigmoidoscopy on 03/23/2021 which showed multiple colonic angiectasia's.  Ablated with spray cryotherapy using liquid nitrogen.  Exam was otherwise.   Today, patient is very frustrated with the amount of doctors he is seeing.  States he is tired and is not interested in a phlebotomy  today.  He reports 1 episode of hematochezia since his flexible sigmoidoscopy with Dr. [name].  He is discouraged.  States he looked at his lab work and does not feel it is significant enough for phlebotomy.  States all he needs is a "good night sleep".   Review of Systems  Constitutional:  Positive for fatigue.  Gastrointestinal:  Positive for blood in stool.  Musculoskeletal:  Positive for gait problem.  Neurological:  Positive for gait problem.  Psychiatric/Behavioral:  Positive for sleep disturbance.    MEDICAL HISTORY:  Past Medical History:  Diagnosis Date   Actinic keratosis 01/31/2016   Left lateral neck post auricular. AK and prurigo nodularis   Actinic keratosis 01/31/2016   Left lateral neck post auricular, inferior. AK and prurigo nodularis   Actinic keratosis 12/19/2017   Left ear anti-helix   Ankle arthropathy 02/24/2014   Arthritis of foot, degenerative 10/03/2012   Overview:  Subtalar arthritis    Basal cell carcinoma 04/30/2008   Left mid dorsum nose.   Basal cell carcinoma 07/12/2009   Left post. deltoid sup. lat. edge of cancer scar.    Cancer ([place])    PROSTATE   Elevated PSA    Hereditary hemochromatosis ([place]) 11/03/2015   Overview:  Treated by regular blood donation    History of kidney stones    HLD (hyperlipidemia) 11/03/2015   Hyperlipidemia    Inguinal hernia    Localized, primary osteoarthritis of ankle or foot 02/24/2014   Osteoarthritis    Squamous cell carcinoma of skin 05/17/2010   Left forearm. KA-like pattern. EDC.   Squamous cell carcinoma of skin 12/18/2016  Right mid lat. volar forearm.WD SCC. EDC   Squamous cell carcinoma of skin 01/22/2019   Right lateral distal tricep near elbow.    Squamous cell carcinoma of skin 07/14/2019   Right forearm. WD SCC. EDC   Squamous cell carcinoma of skin 09/29/2019   Right proximal mandible. SCCis   Squamous cell carcinoma of skin 08/17/2020   Left ear sup helix    SURGICAL HISTORY: Past Surgical  History:  Procedure Laterality Date   ANKLE FUSION Right 2001, 2016   BONE MARROW ASPIRATION     COLONOSCOPY WITH PROPOFOL N/A 08/09/2020   Procedure: COLONOSCOPY WITH PROPOFOL;  Surgeon: Toledo, Benay Pike, MD;  Location: [facility] ENDOSCOPY;  Service: Gastroenterology;  Laterality: N/A;   FLEXIBLE SIGMOIDOSCOPY N/A 01/05/2021   Procedure: FLEXIBLE SIGMOIDOSCOPY;  Surgeon: Toledo, Benay Pike, MD;  Location: [facility] ENDOSCOPY;  Service: Gastroenterology;  Laterality: N/A;   FLEXIBLE SIGMOIDOSCOPY N/A 03/23/2021   Procedure: FLEXIBLE SIGMOIDOSCOPY;  Surgeon: Toledo, Benay Pike, MD;  Location: [facility] ENDOSCOPY;  Service: Gastroenterology;  Laterality: N/A;  CRYOTHERAPY PATIENT   FOOT ARTHRODESIS, SUBTALAR     HYDROCELE EXCISION / REPAIR     HYDROCELE EXCISION / REPAIR     JOINT REPLACEMENT Bilateral 02-2004/05-2010   KNEE ARTHROSCOPY     REVISION TOTAL HIP ARTHROPLASTY  2006   Right 2011, Left 2006    SOCIAL HISTORY: Social History   Socioeconomic History   Marital status: Married    Spouse name: Not on file   Number of children: Not on file   Years of education: Not on file   Highest education level: Not on file  Occupational History   Not on file  Tobacco Use   Smoking status: Former    Years: 20.00    Types: Cigarettes    Quit date: 09/10/1979    Years since quitting: 41.6   Smokeless tobacco: Never  Vaping Use   Vaping Use: Never used  Substance and Sexual Activity   Alcohol use: Yes    Alcohol/week: 32.0 standard drinks    Types: 16 Glasses of wine, 16 Standard drinks or equivalent per week   Drug use: Never   Sexual activity: Not Currently  Other Topics Concern   Not on file  Social History Narrative   Not on file   Social Determinants of Health   Financial Resource Strain: Not on file  Food Insecurity: Not on file  Transportation Needs: Not on file  Physical Activity: Not on file  Stress: Not on file  Social Connections: Not on file  Intimate Partner Violence: Not on  file    FAMILY HISTORY: Family History  Problem Relation Age of Onset   Hemachromatosis Sister    Bladder Cancer Neg Hx    Prostate cancer Neg Hx    Kidney cancer Neg Hx     ALLERGIES:  has No Known Allergies.  MEDICATIONS:  Current Outpatient Medications  Medication Sig Dispense Refill   Calcium Carbonate-Vitamin D (CALCIUM-VITAMIN D3 PO) Take by mouth.     Glucosamine 500 MG CAPS Take by mouth.     loperamide (IMODIUM) 2 MG capsule Take 4 mg by mouth daily.     Multiple Vitamin (MULTI-VITAMINS) TABS Take by mouth.      mupirocin ointment (BACTROBAN) 2 % Apply 1 application topically daily. With dressing changes 22 g 0   naproxen sodium (ALEVE) 220 MG tablet Take 220 mg by mouth.     predniSONE (DELTASONE) 10 MG tablet Take 10 mg by mouth daily with breakfast. (  Patient not taking: Reported on 01/05/2021)     Probiotic Product (BIO-KULT INFANTIS PO) Take by mouth every morning.     simvastatin (ZOCOR) 20 MG tablet      sucralfate (CARAFATE) 1 GM/10ML suspension Take 20 mLs (2 g total) by mouth 2 (two) times daily. Dissolve 2 gram in 74m of tap water.     vitamin B-12 (CYANOCOBALAMIN) 1000 MCG tablet Take 1 tablet (1,000 mcg total) by mouth daily. 90 tablet 1   No current facility-administered medications for this visit.     PHYSICAL EXAMINATION: ECOG PERFORMANCE STATUS: 1 - Symptomatic but completely ambulatory There were no vitals filed for this visit.  There were no vitals filed for this visit.   Physical Exam Constitutional:      Appearance: Normal appearance.  HENT:     Head: Normocephalic and atraumatic.  Eyes:     Pupils: Pupils are equal, round, and reactive to light.  Cardiovascular:     Rate and Rhythm: Normal rate and regular rhythm.     Heart sounds: Normal heart sounds. No murmur heard. Pulmonary:     Effort: Pulmonary effort is normal.     Breath sounds: Normal breath sounds. No wheezing.  Abdominal:     General: Bowel sounds are normal. There is no  distension.     Palpations: Abdomen is soft.     Tenderness: There is no abdominal tenderness.  Musculoskeletal:        General: Normal range of motion.     Cervical back: Normal range of motion.  Skin:    General: Skin is warm and dry.     Findings: No rash.  Neurological:     Mental Status: He is alert and oriented to person, place, and time.  Psychiatric:        Judgment: Judgment normal.     LABORATORY DATA:  I have reviewed the data as listed Lab Results  Component Value Date   WBC 6.0 04/04/2021   HGB 13.6 04/04/2021   HCT 39.4 04/04/2021   MCV 103.7 (H) 04/04/2021   PLT 239 04/04/2021   Recent Labs    06/22/20 1102 09/27/20 1141  PROT 7.2 7.0  ALBUMIN 4.2 4.0  AST 32 26  ALT 28 20  ALKPHOS 44 44  BILITOT 1.1 0.7  BILIDIR 0.2 0.1  IBILI 0.9 0.6    Iron/TIBC/Ferritin/ %Sat    Component Value Date/Time   IRON 244 (H) 04/04/2021 1414   TIBC 288 04/04/2021 1414   FERRITIN 242 04/04/2021 1414   IRONPCTSAT 85 (H) 04/04/2021 1414      RADIOGRAPHIC STUDIES: I have personally reviewed the radiological images as listed and agreed with the findings in the report.  No results found.   ASSESSMENT & PLAN: Mr. CGaertnerpresents today to review most recent lab work and for possible phlebotomy. No diagnosis found.  History of hereditary hemochromatosis-  2 copies of C282Y mutation.  Homozygous hemochromatosis Prior to prostate cancer diagnosis he was donating blood every 8 to 10 weeks. He has mild anemia and has been unable to donate his blood. He has been seeing Dr. YTasia Catchingsfor phlebotomies every few months.  His last phlebotomy was in December 2021. Labs from 04/04/2021 show hemoglobin 13.6, ferritin 242 and iron saturation of 85 He would like to hold off on phlebotomy today and reschedule in a few weeks.   Hematochezia Met with Dr. TAlice Reicherton 03/23/2021 for hematochezia. Thought to be secondary to radiation proctitis Had a flexible sigmoidoscopy on 03/23/2021  which  showed multiple colonic angiectasia's.  Ablated with spray cryotherapy using liquid nitrogen.  Exam was otherwise unremarkable.  History of prostate cancer- Status post radiation and is followed by Dr. Donella Stade and Dr. Erlene Quan.  Most recent PSA is less than 0.1.  Macrocytosis- Normal vitamin B12 and folate level.  Thought to be secondary to chronic alcohol use.  Continue B12 supplements.  Disposition- Patient would like to hold off on phlebotomy today.  I did review his lab work and recommended phlebotomy given increase in iron saturation and ferritin levels.  He continues to want to hold off due to significant fatigue from frequent doctors visits.  He was agreeable to return to clinic in a few weeks for a phlebotomy. He is scheduled to return on 05/24/2021 for repeat lab work (CBC, iron panel and ferritin) including his PSA for Dr. Donella Stade and phlebotomy.  I spent 20 minutes dedicated to the care of this patient (face-to-face and non-face-to-face) on the date of the encounter to include what is described in the assessment and plan.  Faythe Casa, NP 04/07/2021 8:22 AM

## 2021-04-07 ENCOUNTER — Encounter: Payer: Self-pay | Admitting: Oncology

## 2021-04-08 ENCOUNTER — Other Ambulatory Visit: Payer: Medicare HMO

## 2021-04-08 ENCOUNTER — Other Ambulatory Visit: Payer: Self-pay

## 2021-04-08 DIAGNOSIS — C61 Malignant neoplasm of prostate: Secondary | ICD-10-CM | POA: Diagnosis not present

## 2021-04-09 LAB — PSA: Prostate Specific Ag, Serum: <0.1 ng/mL (ref 0.0–4.0)

## 2021-04-12 ENCOUNTER — Ambulatory Visit: Payer: Medicare HMO | Admitting: Oncology

## 2021-04-12 NOTE — Progress Notes (Signed)
04/14/21 10:42 AM   Andrew Walsh 12-21-1940 [id number]  Referring provider:  Adin Hector, MD [facility] [place],  [place] [address] Chief Complaint  Patient presents with   Prostate Cancer     HPI: Andrew Walsh is a 80 y.o.male with a personal history of unfavorable intermediate risk prostate cancer status post fusion biopsy 02/2019 he is status post EBRT+ ADT x6 months. Patient returns today for 1 year follow-up with PSA prior.   Patient NED.   04/08/2021 PSA <0.1   In terms of urinary symptoms, he has had no significant issues. Patient reports mild urgency and frequency. No incontinence.   Patient states he still has some rectal bleeding in stool. States he has had two sigmoidoscopy which did not elevate symptoms. His fecal incontinence has gotten better. States he will be speaking with his gastroenterologist.    PSA Trend:  Component     Latest Ref Rng & Units 04/06/2020 10/08/2020 04/08/2021  Prostate Specific Ag, Serum     0.0 - 4.0 ng/mL <0.1 <0.1 <0.1       PMH: Past Medical History:  Diagnosis Date   Actinic keratosis 01/31/2016   Left lateral neck post auricular. AK and prurigo nodularis   Actinic keratosis 01/31/2016   Left lateral neck post auricular, inferior. AK and prurigo nodularis   Actinic keratosis 12/19/2017   Left ear anti-helix   Ankle arthropathy 02/24/2014   Arthritis of foot, degenerative 10/03/2012   Overview:  Subtalar arthritis    Basal cell carcinoma 04/30/2008   Left mid dorsum nose.   Basal cell carcinoma 07/12/2009   Left post. deltoid sup. lat. edge of cancer scar.    Cancer ([place])    PROSTATE   Elevated PSA    Hereditary hemochromatosis ([place]) 11/03/2015   Overview:  Treated by regular blood donation    History of kidney stones    HLD (hyperlipidemia) 11/03/2015   Hyperlipidemia    Inguinal hernia    Localized, primary osteoarthritis of ankle or foot 02/24/2014   Osteoarthritis    Squamous  cell carcinoma of skin 05/17/2010   Left forearm. KA-like pattern. EDC.   Squamous cell carcinoma of skin 12/18/2016   Right mid lat. volar forearm.WD SCC. EDC   Squamous cell carcinoma of skin 01/22/2019   Right lateral distal tricep near elbow.    Squamous cell carcinoma of skin 07/14/2019   Right forearm. WD SCC. EDC   Squamous cell carcinoma of skin 09/29/2019   Right proximal mandible. SCCis   Squamous cell carcinoma of skin 08/17/2020   Left ear sup helix    Surgical History: Past Surgical History:  Procedure Laterality Date   ANKLE FUSION Right 2001, 2016   BONE MARROW ASPIRATION     COLONOSCOPY WITH PROPOFOL N/A 08/09/2020   Procedure: COLONOSCOPY WITH PROPOFOL;  Surgeon: Toledo, Benay Pike, MD;  Location: [facility] ENDOSCOPY;  Service: Gastroenterology;  Laterality: N/A;   FLEXIBLE SIGMOIDOSCOPY N/A 01/05/2021   Procedure: FLEXIBLE SIGMOIDOSCOPY;  Surgeon: Toledo, Benay Pike, MD;  Location: [facility] END[facility]COPY;  Service: Gastroenterology;  Laterality: N/A;   FLEXIBLE SIGMOIDOSCOPY N/A 03/23/2021   Procedure: FLEXIBLE SIGMOIDOSCOPY;  Surgeon: Toledo, Benay Pike, MD;  Location: [facility] ENDOSCOPY;  Service: Gastroenterology;  Laterality: N/A;  CRYOTHERAPY PATIENT   FOOT ARTHRODESIS, SUBTALAR     HYDROCELE EXCISION / REPAIR     HYDROCELE EXCISION / REPAIR     JOINT REPLACEMENT Bilateral 02-2004/05-2010   KNEE ARTHROSCOPY     REVISION  TOTAL HIP ARTHROPLASTY  2006   Right 2011, Left 2006    Home Medications:  Allergies as of 04/13/2021   No Known Allergies      Medication List        Accurate as of April 13, 2021 11:59 PM. If you have any questions, ask your nurse or doctor.          STOP taking these medications    BIO-KULT INFANTIS PO Stopped by: Hollice Espy, MD   Glucosamine 500 MG Caps Stopped by: Hollice Espy, MD   Multi-Vitamins Tabs Stopped by: Hollice Espy, MD   mupirocin ointment 2 % Commonly known as: BACTROBAN Stopped by: Hollice Espy, MD    predniSONE 10 MG tablet Commonly known as: DELTASONE Stopped by: Hollice Espy, MD   sucralfate 1 g tablet Commonly known as: CARAFATE Stopped by: Hollice Espy, MD   sucralfate 1 GM/10ML suspension Commonly known as: CARAFATE Stopped by: Hollice Espy, MD       TAKE these medications    CALCIUM-VITAMIN D3 PO Take by mouth.   loperamide 2 MG capsule Commonly known as: IMODIUM Take 4 mg by mouth daily.   naproxen sodium 220 MG tablet Commonly known as: ALEVE Take 220 mg by mouth.   simvastatin 20 MG tablet Commonly known as: ZOCOR   vitamin B-12 1000 MCG tablet Commonly known as: CYANOCOBALAMIN Take 1 tablet (1,000 mcg total) by mouth daily.        Allergies: No Known Allergies  Family History: Family History  Problem Relation Age of Onset   Hemachromatosis Sister    Bladder Cancer Neg Hx    Prostate cancer Neg Hx    Kidney cancer Neg Hx     Social History:  reports that he quit smoking about 41 years ago. His smoking use included cigarettes. He has never used smokeless tobacco. He reports current alcohol use of about 32.0 standard drinks of alcohol per week. He reports that he does not use drugs.   Physical Exam: BP 130/76   Pulse 80   Ht '5\' 7"'$  (1.702 m)   Wt 160 lb (72.6 kg)   BMI 25.06 kg/m   Constitutional:  Alert and oriented, No acute distress. HEENT: [place] AT, moist mucus membranes.  Trachea midline, no masses. Cardiovascular: No clubbing, cyanosis, or edema. Respiratory: Normal respiratory effort, no increased work of breathing. Skin: No rashes, bruises or suspicious lesions. Neurologic: Grossly intact, no focal deficits, moving all 4 extremities. Psychiatric: Normal mood and affect.   Assessment & Plan:    History of prostate cancer NED, PSA remains undetectable Repeat PSA next year  Urinary frequency  - Minimal bothersome symptoms  - Patient continues not wish to pursue pharmaceutical options but understand this is an option if  symptoms worsen - will continue to monitor   3. Radiation proctitis  - Continues to have issues but to a lesser degree.  - Will speak with his gastroenterologist    Follow-up in a year with PSA prior   Conley Rolls as a scribe for Hollice Espy, MD.,have documented all relevant documentation on the behalf of Hollice Espy, MD,as directed by  Hollice Espy, MD while in the presence of Hollice Espy, MD.  Hollice Espy, MD   [facility] [address] [place], [place] [address] [phone]

## 2021-04-13 ENCOUNTER — Ambulatory Visit: Payer: Medicare HMO | Admitting: Urology

## 2021-04-13 ENCOUNTER — Other Ambulatory Visit: Payer: Self-pay

## 2021-04-13 VITALS — BP 130/76 | HR 80 | Ht 67.0 in | Wt 160.0 lb

## 2021-04-13 DIAGNOSIS — R350 Frequency of micturition: Secondary | ICD-10-CM | POA: Diagnosis not present

## 2021-04-13 DIAGNOSIS — K627 Radiation proctitis: Secondary | ICD-10-CM | POA: Diagnosis not present

## 2021-04-13 DIAGNOSIS — M4727 Other spondylosis with radiculopathy, lumbosacral region: Secondary | ICD-10-CM | POA: Diagnosis not present

## 2021-04-13 DIAGNOSIS — C61 Malignant neoplasm of prostate: Secondary | ICD-10-CM

## 2021-04-15 DIAGNOSIS — M4727 Other spondylosis with radiculopathy, lumbosacral region: Secondary | ICD-10-CM | POA: Diagnosis not present

## 2021-04-20 ENCOUNTER — Other Ambulatory Visit: Payer: Medicare HMO

## 2021-04-20 DIAGNOSIS — M4727 Other spondylosis with radiculopathy, lumbosacral region: Secondary | ICD-10-CM | POA: Diagnosis not present

## 2021-04-22 DIAGNOSIS — M4727 Other spondylosis with radiculopathy, lumbosacral region: Secondary | ICD-10-CM | POA: Diagnosis not present

## 2021-04-27 ENCOUNTER — Ambulatory Visit: Payer: Medicare HMO | Admitting: Radiation Oncology

## 2021-04-27 DIAGNOSIS — M4727 Other spondylosis with radiculopathy, lumbosacral region: Secondary | ICD-10-CM | POA: Diagnosis not present

## 2021-04-29 DIAGNOSIS — M4727 Other spondylosis with radiculopathy, lumbosacral region: Secondary | ICD-10-CM | POA: Diagnosis not present

## 2021-05-02 DIAGNOSIS — M4727 Other spondylosis with radiculopathy, lumbosacral region: Secondary | ICD-10-CM | POA: Diagnosis not present

## 2021-05-04 DIAGNOSIS — M4727 Other spondylosis with radiculopathy, lumbosacral region: Secondary | ICD-10-CM | POA: Diagnosis not present

## 2021-05-13 DIAGNOSIS — M9902 Segmental and somatic dysfunction of thoracic region: Secondary | ICD-10-CM | POA: Diagnosis not present

## 2021-05-13 DIAGNOSIS — M4315 Spondylolisthesis, thoracolumbar region: Secondary | ICD-10-CM | POA: Diagnosis not present

## 2021-05-13 DIAGNOSIS — M9903 Segmental and somatic dysfunction of lumbar region: Secondary | ICD-10-CM | POA: Diagnosis not present

## 2021-05-13 DIAGNOSIS — M5416 Radiculopathy, lumbar region: Secondary | ICD-10-CM | POA: Diagnosis not present

## 2021-05-24 ENCOUNTER — Other Ambulatory Visit: Payer: Medicare HMO

## 2021-05-24 ENCOUNTER — Ambulatory Visit: Payer: Medicare HMO | Admitting: Radiation Oncology

## 2021-05-24 ENCOUNTER — Ambulatory Visit: Payer: Medicare HMO | Admitting: Oncology

## 2021-06-06 ENCOUNTER — Inpatient Hospital Stay: Payer: Medicare HMO | Attending: Oncology

## 2021-06-06 ENCOUNTER — Inpatient Hospital Stay: Payer: Medicare HMO | Admitting: Oncology

## 2021-06-06 ENCOUNTER — Inpatient Hospital Stay: Payer: Medicare HMO

## 2021-06-06 DIAGNOSIS — Z923 Personal history of irradiation: Secondary | ICD-10-CM | POA: Insufficient documentation

## 2021-06-06 DIAGNOSIS — Z85828 Personal history of other malignant neoplasm of skin: Secondary | ICD-10-CM | POA: Diagnosis not present

## 2021-06-06 DIAGNOSIS — Z8546 Personal history of malignant neoplasm of prostate: Secondary | ICD-10-CM | POA: Diagnosis not present

## 2021-06-06 DIAGNOSIS — Z79899 Other long term (current) drug therapy: Secondary | ICD-10-CM | POA: Diagnosis not present

## 2021-06-06 DIAGNOSIS — Z87891 Personal history of nicotine dependence: Secondary | ICD-10-CM | POA: Diagnosis not present

## 2021-06-06 DIAGNOSIS — D539 Nutritional anemia, unspecified: Secondary | ICD-10-CM | POA: Diagnosis not present

## 2021-06-06 DIAGNOSIS — C61 Malignant neoplasm of prostate: Secondary | ICD-10-CM

## 2021-06-06 LAB — CBC WITH DIFFERENTIAL/PLATELET
Abs Immature Granulocytes: 0.04 10*3/uL (ref 0.00–0.07)
Basophils Absolute: 0 10*3/uL (ref 0.0–0.1)
Basophils Relative: 1 %
Eosinophils Absolute: 0.1 10*3/uL (ref 0.0–0.5)
Eosinophils Relative: 1 %
HCT: 38.8 % — ABNORMAL LOW (ref 39.0–52.0)
Hemoglobin: 13.4 g/dL (ref 13.0–17.0)
Immature Granulocytes: 1 %
Lymphocytes Relative: 23 %
Lymphs Abs: 1.3 10*3/uL (ref 0.7–4.0)
MCH: 35.9 pg — ABNORMAL HIGH (ref 26.0–34.0)
MCHC: 34.5 g/dL (ref 30.0–36.0)
MCV: 104 fL — ABNORMAL HIGH (ref 80.0–100.0)
Monocytes Absolute: 0.5 10*3/uL (ref 0.1–1.0)
Monocytes Relative: 9 %
Neutro Abs: 3.9 10*3/uL (ref 1.7–7.7)
Neutrophils Relative %: 65 %
Platelets: 237 10*3/uL (ref 150–400)
RBC: 3.73 MIL/uL — ABNORMAL LOW (ref 4.22–5.81)
RDW: 12.4 % (ref 11.5–15.5)
WBC: 5.9 10*3/uL (ref 4.0–10.5)
nRBC: 0 % (ref 0.0–0.2)

## 2021-06-06 LAB — FERRITIN: Ferritin: 343 ng/mL — ABNORMAL HIGH (ref 24–336)

## 2021-06-06 LAB — IRON AND TIBC
Iron: 202 ug/dL — ABNORMAL HIGH (ref 45–182)
Saturation Ratios: 71 % — ABNORMAL HIGH (ref 17.9–39.5)
TIBC: 286 ug/dL (ref 250–450)
UIBC: 84 ug/dL

## 2021-06-06 MED ORDER — SODIUM CHLORIDE 0.9 % IV SOLN
Freq: Once | INTRAVENOUS | Status: AC
  Filled 2021-06-06: qty 250

## 2021-06-06 NOTE — Progress Notes (Signed)
At this time ptt has "no concerns pertinent to this visit."

## 2021-06-06 NOTE — Patient Instructions (Signed)

## 2021-06-06 NOTE — Progress Notes (Signed)
Hematology/Oncology note [facility] [phone] [phone]   Patient Care Team: Adin Hector, MD as PCP - General (Internal Medicine)  REFERRING PROVIDER: Adin Hector, MD  CHIEF COMPLAINTS/REASON FOR VISIT:  Establish care for hemochromatosis management  HISTORY OF PRESENTING ILLNESS:  Andrew Walsh is an 80 year old male who is followed by Dr. Tasia Catchings for management of elevated ferritin/hemochromatosis.  Patient has longstanding history of hereditary hemochromatosis and was diagnosed in the 90s by a provider in [place].  He has been an active blood donor every 8 to 10 weeks for many years.  He has a history of prostate cancer with a PSA of 9.4 and underwent IMRT with Dr. Donella Stade which she completed in September 2020.   He is no longer eligible for blood donation secondary to mild anemia and recent treatment for prostate cancer.  Phlebotomy was held during his last visit secondary to hematochezia.  He was referred back to GI for evaluation.  Patient was seen by gastroenterology Dr. [name] on 10/07/2020, his hematochezia was considered to be secondary to radiation-induced proctitis with telangiectasia.  Patient had a colonoscopy with [facility] on 08/09/2020.Patient was recommended to use Imodium.  As patient continues to have symptoms, he has discussed with Dr. Ricky Stabs team in early April 2022 and was advised to use Carafate enema twice daily. Patient reports that he has been doing that since 12/14/2020 and has not noticed any improvement.  INTERVAL HISTORY-Andrew Walsh presents for follow-up for his hemochromatosis management.  His last phlebotomy was on 08/24/2020.  In the interim, he reports having many doctors appointments.  He was seen by Dr. [name] on 03/23/2021 which showed multiple colonic angiectasia's which were ablated with a cryotherapy using liquid nitrogen.  Otherwise exam was unremarkable.  Since his procedure, he has not noted any  additional GI bleeding.  Reports feeling pretty good since his last visit with [place].  Reports stable energy levels.  Reports persistent right sciatic pain which he thinks will eventually need surgery.  He has completed 10 rounds of physical therapy and has been seen by a chiropractor without any relief.  States the pain is often times debilitating.  Overall he eats a great diet and drinks plenty of fluids.  His insomnia has improved.    Review of Systems  Constitutional:  Positive for fatigue. Negative for appetite change, chills and fever.  HENT:  Negative.  Negative for hearing loss, lump/mass, mouth sores and nosebleeds.   Eyes: Negative.  Negative for eye problems.  Respiratory:  Negative for cough, hemoptysis and shortness of breath.   Cardiovascular: Negative.  Negative for chest pain and leg swelling.  Gastrointestinal: Negative.  Negative for abdominal pain, blood in stool, constipation, diarrhea, nausea and vomiting.  Endocrine: Negative.  Negative for hot flashes.  Genitourinary: Negative.  Negative for bladder incontinence, difficulty urinating, dysuria, frequency and hematuria.   Musculoskeletal:  Positive for back pain (Right-sided sciatic pain) and gait problem. Negative for flank pain and myalgias.  Skin: Negative.  Negative for itching and rash.  Neurological:  Positive for gait problem. Negative for dizziness, headaches, light-headedness and numbness.  Hematological: Negative.  Negative for adenopathy.  Psychiatric/Behavioral:  Negative for confusion. The patient is not nervous/anxious.    MEDICAL HISTORY:  Past Medical History:  Diagnosis Date   Actinic keratosis 01/31/2016   Left lateral neck post auricular. AK and prurigo nodularis   Actinic keratosis 01/31/2016   Left lateral neck post auricular, inferior. AK and prurigo nodularis  Actinic keratosis 12/19/2017   Left ear anti-helix   Ankle arthropathy 02/24/2014   Arthritis of foot, degenerative 10/03/2012    Overview:  Subtalar arthritis    Basal cell carcinoma 04/30/2008   Left mid dorsum nose.   Basal cell carcinoma 07/12/2009   Left post. deltoid sup. lat. edge of cancer scar.    Cancer ([place])    PROSTATE   Elevated PSA    Hereditary hemochromatosis ([place]) 11/03/2015   Overview:  Treated by regular blood donation    History of kidney stones    HLD (hyperlipidemia) 11/03/2015   Hyperlipidemia    Inguinal hernia    Localized, primary osteoarthritis of ankle or foot 02/24/2014   Osteoarthritis    Squamous cell carcinoma of skin 05/17/2010   Left forearm. KA-like pattern. EDC.   Squamous cell carcinoma of skin 12/18/2016   Right mid lat. volar forearm.WD SCC. EDC   Squamous cell carcinoma of skin 01/22/2019   Right lateral distal tricep near elbow.    Squamous cell carcinoma of skin 07/14/2019   Right forearm. WD SCC. EDC   Squamous cell carcinoma of skin 09/29/2019   Right proximal mandible. SCCis   Squamous cell carcinoma of skin 08/17/2020   Left ear sup helix    SURGICAL HISTORY: Past Surgical History:  Procedure Laterality Date   ANKLE FUSION Right 2001, 2016   BONE MARROW ASPIRATION     COLONOSCOPY WITH PROPOFOL N/A 08/09/2020   Procedure: COLONOSCOPY WITH PROPOFOL;  Surgeon: Toledo, Benay Pike, MD;  Location: [facility] ENDOSCOPY;  Service: Gastroenterology;  Laterality: N/A;   FLEXIBLE SIGMOIDOSCOPY N/A 01/05/2021   Procedure: FLEXIBLE SIGMOIDOSCOPY;  Surgeon: Toledo, Benay Pike, MD;  Location: [facility] ENDOSCOPY;  Service: Gastroenterology;  Laterality: N/A;   FLEXIBLE SIGMOIDOSCOPY N/A 03/23/2021   Procedure: FLEXIBLE SIGMOIDOSCOPY;  Surgeon: Toledo, Benay Pike, MD;  Location: [facility] ENDOSCOPY;  Service: Gastroenterology;  Laterality: N/A;  CRYOTHERAPY PATIENT   FOOT ARTHRODESIS, SUBTALAR     HYDROCELE EXCISION / REPAIR     HYDROCELE EXCISION / REPAIR     JOINT REPLACEMENT Bilateral 02-2004/05-2010   KNEE ARTHROSCOPY     REVISION TOTAL HIP ARTHROPLASTY  2006   Right 2011, Left 2006     SOCIAL HISTORY: Social History   Socioeconomic History   Marital status: Married    Spouse name: Not on file   Number of children: Not on file   Years of education: Not on file   Highest education level: Not on file  Occupational History   Not on file  Tobacco Use   Smoking status: Former    Years: 20.00    Types: Cigarettes    Quit date: 09/10/1979    Years since quitting: 41.7   Smokeless tobacco: Never  Vaping Use   Vaping Use: Never used  Substance and Sexual Activity   Alcohol use: Yes    Alcohol/week: 32.0 standard drinks    Types: 16 Glasses of wine, 16 Standard drinks or equivalent per week   Drug use: Never   Sexual activity: Not Currently  Other Topics Concern   Not on file  Social History Narrative   Not on file   Social Determinants of Health   Financial Resource Strain: Not on file  Food Insecurity: Not on file  Transportation Needs: Not on file  Physical Activity: Not on file  Stress: Not on file  Social Connections: Not on file  Intimate Partner Violence: Not on file    FAMILY HISTORY: Family History  Problem Relation Age of  Onset   Hemachromatosis Sister    Bladder Cancer Neg Hx    Prostate cancer Neg Hx    Kidney cancer Neg Hx     ALLERGIES:  has No Known Allergies.  MEDICATIONS:  Current Outpatient Medications  Medication Sig Dispense Refill   Calcium Carbonate-Vitamin D (CALCIUM-VITAMIN D3 PO) Take by mouth.     loperamide (IMODIUM) 2 MG capsule Take 4 mg by mouth daily.     simvastatin (ZOCOR) 20 MG tablet      vitamin B-12 (CYANOCOBALAMIN) 1000 MCG tablet Take 1 tablet (1,000 mcg total) by mouth daily. 90 tablet 1   naproxen sodium (ALEVE) 220 MG tablet Take 220 mg by mouth.     No current facility-administered medications for this visit.     PHYSICAL EXAMINATION: ECOG PERFORMANCE STATUS: 1 - Symptomatic but completely ambulatory Vitals:   06/06/21 1301  BP: 134/85  Pulse: 60  Resp: 16  Temp: 98.3 F (36.8 C)  SpO2:  99%    Filed Weights   06/06/21 1301  Weight: 159 lb 14.4 oz (72.5 kg)     Physical Exam Constitutional:      Appearance: Normal appearance.  HENT:     Head: Normocephalic and atraumatic.  Eyes:     Pupils: Pupils are equal, round, and reactive to light.  Cardiovascular:     Rate and Rhythm: Normal rate and regular rhythm.     Heart sounds: Normal heart sounds. No murmur heard. Pulmonary:     Effort: Pulmonary effort is normal.     Breath sounds: Normal breath sounds. No wheezing.  Abdominal:     General: Bowel sounds are normal. There is no distension.     Palpations: Abdomen is soft.     Tenderness: There is no abdominal tenderness.  Musculoskeletal:        General: Normal range of motion.     Cervical back: Normal range of motion.  Skin:    General: Skin is warm and dry.     Findings: No rash.  Neurological:     Mental Status: He is alert and oriented to person, place, and time.  Psychiatric:        Judgment: Judgment normal.     LABORATORY DATA:  I have reviewed the data as listed Lab Results  Component Value Date   WBC 5.9 06/06/2021   HGB 13.4 06/06/2021   HCT 38.8 (L) 06/06/2021   MCV 104.0 (H) 06/06/2021   PLT 237 06/06/2021   Recent Labs    06/22/20 1102 09/27/20 1141  PROT 7.2 7.0  ALBUMIN 4.2 4.0  AST 32 26  ALT 28 20  ALKPHOS 44 44  BILITOT 1.1 0.7  BILIDIR 0.2 0.1  IBILI 0.9 0.6    Iron/TIBC/Ferritin/ %Sat    Component Value Date/Time   IRON 244 (H) 04/04/2021 1414   TIBC 288 04/04/2021 1414   FERRITIN 242 04/04/2021 1414   IRONPCTSAT 85 (H) 04/04/2021 1414      RADIOGRAPHIC STUDIES: I have personally reviewed the radiological images as listed and agreed with the findings in the report.  No results found.   ASSESSMENT & PLAN: Andrew Walsh presents today to review most recent lab work and for possible phlebotomy. No diagnosis found.  History of hereditary hemochromatosis-  Andrew Walsh was diagnosed with homozygous  hemochromatosis having 2 copies of C282Y mutation.  He receives intermittent phlebotomies last completed back in December 2021.  Lab work from 06/06/2021 show hemoglobin of 13.4 and MCV of 104.0.  His  ferritin has been slowly trending up over the past few visits and a phlebotomy was recommended during his last visit but he refused secondary to feeling tired.  Ferritin today is 343 with an iron saturation of 71%.  He is agreeable to a phlebotomy today.  Return to clinic in 3 months for repeat lab work, MD assessment and possible phlebotomy.  He is interested in donating to the [employer].  He will look into this and donate if able prior to his next appointment.  If unable, will plan for phlebotomy in 3 months.  Hematochezia Has flexible sigmoidoscopy on 03/23/2021 for hematochezia.  Exam revealed multiple colonic angiectasis which were sprayed with cryotherapy using liquid nitrogen.  No bleeding since.  History of prostate cancer- He is status post radiation and is followed by Dr. Donella Stade and Dr. Erlene Quan.  Latest PSA is less than 0.1.  Macrocytosis- MCV is 104.0.  Previously has had a normal B12 and folate level.  This is thought to be secondary to chronic alcohol use.  Continue B12 supplements.  Disposition- Proceed with phlebotomy today.  Return to clinic in 3 months for repeat lab work, MD assessment and possible phlebotomy.  Patient may donate blood to [employer] in the next 8 to 10 weeks if able.  If not, he will plan to have a phlebotomy with [place] in 3 months.  I spent 20 minutes dedicated to the care of this patient (face-to-face and non-face-to-face) on the date of the encounter to include what is described in the assessment and plan.,e  Faythe Casa, NP 06/06/2021 1:16 PM

## 2021-06-06 NOTE — Progress Notes (Signed)
Removed 500 ml blood per phlebotomy orders. Replaced the blood loss with 500 ml NS over 30 minutes. Patient tolerated procedure well. Accepted chocolate chip cookies. Discharged to home feeling well.

## 2021-06-29 ENCOUNTER — Encounter: Payer: Self-pay | Admitting: Dermatology

## 2021-06-29 ENCOUNTER — Other Ambulatory Visit: Payer: Self-pay

## 2021-06-29 ENCOUNTER — Ambulatory Visit: Payer: Medicare HMO | Admitting: Dermatology

## 2021-06-29 DIAGNOSIS — D692 Other nonthrombocytopenic purpura: Secondary | ICD-10-CM | POA: Diagnosis not present

## 2021-06-29 DIAGNOSIS — L570 Actinic keratosis: Secondary | ICD-10-CM

## 2021-06-29 DIAGNOSIS — E785 Hyperlipidemia, unspecified: Secondary | ICD-10-CM | POA: Diagnosis not present

## 2021-06-29 DIAGNOSIS — L905 Scar conditions and fibrosis of skin: Secondary | ICD-10-CM

## 2021-06-29 DIAGNOSIS — L821 Other seborrheic keratosis: Secondary | ICD-10-CM | POA: Diagnosis not present

## 2021-06-29 DIAGNOSIS — L820 Inflamed seborrheic keratosis: Secondary | ICD-10-CM | POA: Diagnosis not present

## 2021-06-29 DIAGNOSIS — C61 Malignant neoplasm of prostate: Secondary | ICD-10-CM | POA: Diagnosis not present

## 2021-06-29 DIAGNOSIS — L578 Other skin changes due to chronic exposure to nonionizing radiation: Secondary | ICD-10-CM

## 2021-06-29 NOTE — Patient Instructions (Signed)

## 2021-06-29 NOTE — Progress Notes (Signed)
   Follow-Up Visit   Subjective  Andrew Walsh is a 80 y.o. male who presents for the following: Actinic Keratosis (Face, ears, 8m f/u).  There are areas he would like evaluated.  He had a cyst scar on his wrist from Band-Aid tearing the skin.  He would like other spots checked.  The following portions of the chart were reviewed this encounter and updated as appropriate:   Tobacco  Allergies  Meds  Problems  Med Hx  Surg Hx  Fam Hx     Review of Systems:  No other skin or systemic complaints except as noted in HPI or Assessment and Plan.  Objective  Well appearing patient in no apparent distress; mood and affect are within normal limits.  A focused examination was performed including face, ears, arms, hands. Relevant physical exam findings are noted in the Assessment and Plan.  bil ears x 4 (4) Pink scaly macules   bil arms x 16, Total = 16 (16) Erythematous keratotic or waxy stuck-on papule or plaque.   L wrist scar  Assessment & Plan  AK (actinic keratosis) (4) bil ears x 4  Destruction of lesion - bil ears x 4 Complexity: simple   Destruction method: cryotherapy   Informed consent: discussed and consent obtained   Timeout:  patient name, date of birth, surgical site, and procedure verified Lesion destroyed using liquid nitrogen: Yes   Region frozen until ice ball extended beyond lesion: Yes   Outcome: patient tolerated procedure well with no complications   Post-procedure details: wound care instructions given    Inflamed seborrheic keratosis bil arms x 16, Total = 16  Destruction of lesion - bil arms x 16, Total = 16 Complexity: simple   Destruction method: cryotherapy   Informed consent: discussed and consent obtained   Timeout:  patient name, date of birth, surgical site, and procedure verified Lesion destroyed using liquid nitrogen: Yes   Region frozen until ice ball extended beyond lesion: Yes   Outcome: patient tolerated procedure well with no  complications   Post-procedure details: wound care instructions given    Scar L wrist From traumatic injury from Band-Aid tearing the skin.   Benign, observe  Actinic Damage - chronic, secondary to cumulative UV radiation exposure/sun exposure over time - diffuse scaly erythematous macules with underlying dyspigmentation - Recommend daily broad spectrum sunscreen SPF 30+ to sun-exposed areas, reapply every 2 hours as needed.  - Recommend staying in the shade or wearing long sleeves, sun glasses (UVA+UVB protection) and wide brim hats (4-inch brim around the entire circumference of the hat). - Call for new or changing lesions.  Seborrheic Keratoses - Stuck-on, waxy, tan-brown papules and/or plaques  - Benign-appearing - Discussed benign etiology and prognosis. - Observe - Call for any changes  Purpura - Chronic; persistent and recurrent.  Treatable, but not curable. - Violaceous macules and patches - Benign - Related to trauma, age, sun damage and/or use of blood thinners, chronic use of topical and/or oral steroids - Observe - Can use OTC arnica containing moisturizer such as Dermend Bruise Formula if desired - Call for worsening or other concerns   Return in about 6 months (around 12/28/2021) for TBSE, Hx of AKs.  I, Othelia Pulling, RMA, am acting as scribe for Sarina Ser, MD . Documentation: I have reviewed the above documentation for accuracy and completeness, and I agree with the above.  Sarina Ser, MD

## 2021-06-30 DIAGNOSIS — R159 Full incontinence of feces: Secondary | ICD-10-CM | POA: Diagnosis not present

## 2021-06-30 DIAGNOSIS — K627 Radiation proctitis: Secondary | ICD-10-CM | POA: Diagnosis not present

## 2021-06-30 DIAGNOSIS — R152 Fecal urgency: Secondary | ICD-10-CM | POA: Diagnosis not present

## 2021-07-06 DIAGNOSIS — M5431 Sciatica, right side: Secondary | ICD-10-CM | POA: Diagnosis not present

## 2021-07-06 DIAGNOSIS — G8929 Other chronic pain: Secondary | ICD-10-CM | POA: Diagnosis not present

## 2021-07-06 DIAGNOSIS — M5441 Lumbago with sciatica, right side: Secondary | ICD-10-CM | POA: Diagnosis not present

## 2021-07-06 DIAGNOSIS — K627 Radiation proctitis: Secondary | ICD-10-CM | POA: Diagnosis not present

## 2021-07-06 DIAGNOSIS — E785 Hyperlipidemia, unspecified: Secondary | ICD-10-CM | POA: Diagnosis not present

## 2021-07-06 DIAGNOSIS — C61 Malignant neoplasm of prostate: Secondary | ICD-10-CM | POA: Diagnosis not present

## 2021-07-06 DIAGNOSIS — M5440 Lumbago with sciatica, unspecified side: Secondary | ICD-10-CM | POA: Diagnosis not present

## 2021-07-06 DIAGNOSIS — M545 Low back pain, unspecified: Secondary | ICD-10-CM | POA: Diagnosis not present

## 2021-07-15 ENCOUNTER — Telehealth: Payer: Self-pay | Admitting: Oncology

## 2021-07-15 NOTE — Telephone Encounter (Signed)
Pt called in to reschedule his appt on 12-19. Please give him a call back at [phone]

## 2021-08-29 ENCOUNTER — Other Ambulatory Visit: Payer: Medicare HMO

## 2021-08-31 ENCOUNTER — Inpatient Hospital Stay: Payer: Medicare HMO | Admitting: Oncology

## 2021-08-31 ENCOUNTER — Other Ambulatory Visit: Payer: Self-pay

## 2021-08-31 ENCOUNTER — Inpatient Hospital Stay: Payer: Medicare HMO

## 2021-08-31 ENCOUNTER — Encounter: Payer: Self-pay | Admitting: Oncology

## 2021-08-31 ENCOUNTER — Inpatient Hospital Stay: Payer: Medicare HMO | Attending: Oncology

## 2021-08-31 DIAGNOSIS — Z923 Personal history of irradiation: Secondary | ICD-10-CM | POA: Diagnosis not present

## 2021-08-31 DIAGNOSIS — D7589 Other specified diseases of blood and blood-forming organs: Secondary | ICD-10-CM

## 2021-08-31 DIAGNOSIS — Z87891 Personal history of nicotine dependence: Secondary | ICD-10-CM | POA: Insufficient documentation

## 2021-08-31 DIAGNOSIS — Z79899 Other long term (current) drug therapy: Secondary | ICD-10-CM | POA: Insufficient documentation

## 2021-08-31 DIAGNOSIS — Z789 Other specified health status: Secondary | ICD-10-CM | POA: Diagnosis not present

## 2021-08-31 DIAGNOSIS — D539 Nutritional anemia, unspecified: Secondary | ICD-10-CM

## 2021-08-31 DIAGNOSIS — C61 Malignant neoplasm of prostate: Secondary | ICD-10-CM | POA: Diagnosis not present

## 2021-08-31 LAB — IRON AND TIBC
Iron: 235 ug/dL — ABNORMAL HIGH (ref 45–182)
Saturation Ratios: 85 % — ABNORMAL HIGH (ref 17.9–39.5)
TIBC: 276 ug/dL (ref 250–450)
UIBC: 41 ug/dL

## 2021-08-31 LAB — CBC WITH DIFFERENTIAL/PLATELET
Abs Immature Granulocytes: 0.03 10*3/uL (ref 0.00–0.07)
Basophils Absolute: 0 10*3/uL (ref 0.0–0.1)
Basophils Relative: 0 %
Eosinophils Absolute: 0.1 10*3/uL (ref 0.0–0.5)
Eosinophils Relative: 2 %
HCT: 39.7 % (ref 39.0–52.0)
Hemoglobin: 13.6 g/dL (ref 13.0–17.0)
Immature Granulocytes: 1 %
Lymphocytes Relative: 25 %
Lymphs Abs: 1.2 10*3/uL (ref 0.7–4.0)
MCH: 36.1 pg — ABNORMAL HIGH (ref 26.0–34.0)
MCHC: 34.3 g/dL (ref 30.0–36.0)
MCV: 105.3 fL — ABNORMAL HIGH (ref 80.0–100.0)
Monocytes Absolute: 0.6 10*3/uL (ref 0.1–1.0)
Monocytes Relative: 12 %
Neutro Abs: 2.7 10*3/uL (ref 1.7–7.7)
Neutrophils Relative %: 60 %
Platelets: 197 10*3/uL (ref 150–400)
RBC: 3.77 MIL/uL — ABNORMAL LOW (ref 4.22–5.81)
RDW: 12.3 % (ref 11.5–15.5)
WBC: 4.5 10*3/uL (ref 4.0–10.5)
nRBC: 0 % (ref 0.0–0.2)

## 2021-08-31 LAB — FERRITIN: Ferritin: 161 ng/mL (ref 24–336)

## 2021-08-31 NOTE — Progress Notes (Signed)
Hematology/Oncology Progress Note [phone] [phone]   Patient Care Team: Adin Hector, MD as PCP - General (Internal Medicine) Earlie Server, MD as Consulting Physician (Oncology)  REFERRING PROVIDER: Adin Hector, MD  CHIEF COMPLAINTS/REASON FOR VISIT:  hemochromatosis management  HISTORY OF PRESENTING ILLNESS:  Andrew Walsh is a 80 y.o. male who was seen in consultation at the request of Adin Hector, MD for evaluation of elevated ferritin level/hemochromatosis management. Patient reports a history  hereditary hemochromatosis.  He was diagnosed in 54s by his previous primary care provider in [place] and he has been an active blood donor every 8 to 10 weeks for many years.  Recently he was diagnosed with prostate cancer Gleason score 4+3 with a PSA of 9.4 and underwent IMRT radiation by Dr. Baruch Gouty and he finished in September 2020. He is mildly anemic and also cannot further donate blood due to recent radiation and was referred to me for management of hemochromatosis and iron overload.   Context Shortness of breath: Patient reports mild shortness of breath after exertion recently and he attributes to Covid pandemic and not able to be physically active lately. Fatigue: denies He had blood work done at [facility] primary care provider's office recently. 08/19/2019, hemoglobin 14, hematocrit 40.7, MCV 105.7. Iron 254, ferritin 514,  07/18/2019, iron 259, ferritin 476, transferrin 193.5, TIBC 270, iron saturation 96  Patient was seen by gastroenterology Dr. [name] on 10/07/2020, his hematochezia was considered to be secondary to radiation-induced proctitis with telangiectasia.  Patient had a colonoscopy with [facility] on 08/09/2020. Patient was recommended to use Imodium.  As patient continues to have symptoms, he has discussed with Dr. Ricky Stabs team in early April 2022 and was advised to use Carafate enema twice daily,  INTERVAL HISTORY Andrew Walsh is a 80 y.o. male who has above history reviewed by me today presents for follow up visit for management of iron overload/hemochromatosis. Patient received phlebotomy at last visit.  Today he reports feeling well.  He denies any bright red blood in stool.   Review of Systems  Constitutional:  Negative for appetite change, chills, fatigue, fever and unexpected weight change.  HENT:   Negative for hearing loss and voice change.   Eyes:  Negative for eye problems and icterus.  Respiratory:  Negative for chest tightness, cough and shortness of breath.   Cardiovascular:  Negative for chest pain and leg swelling.  Gastrointestinal:  Positive for diarrhea. Negative for abdominal distention, abdominal pain and blood in stool.  Endocrine: Negative for hot flashes.  Genitourinary:  Negative for difficulty urinating, dysuria and frequency.   Musculoskeletal:  Negative for arthralgias.  Skin:  Negative for itching and rash.  Neurological:  Negative for light-headedness and numbness.  Hematological:  Negative for adenopathy. Does not bruise/bleed easily.  Psychiatric/Behavioral:  Negative for confusion.    MEDICAL HISTORY:  Past Medical History:  Diagnosis Date   Actinic keratosis 01/31/2016   Left lateral neck post auricular. AK and prurigo nodularis   Actinic keratosis 01/31/2016   Left lateral neck post auricular, inferior. AK and prurigo nodularis   Actinic keratosis 12/19/2017   Left ear anti-helix   Ankle arthropathy 02/24/2014   Arthritis of foot, degenerative 10/03/2012   Overview:  Subtalar arthritis    Basal cell carcinoma 04/30/2008   Left mid dorsum nose.   Basal cell carcinoma 07/12/2009   Left post. deltoid sup. lat. edge of cancer scar.    Cancer ([place])  PROSTATE   Elevated PSA    Hereditary hemochromatosis ([place]) 11/03/2015   Overview:  Treated by regular blood donation    History of kidney stones    HLD (hyperlipidemia) 11/03/2015   Hyperlipidemia    Inguinal  hernia    Localized, primary osteoarthritis of ankle or foot 02/24/2014   Osteoarthritis    Squamous cell carcinoma of skin 05/17/2010   Left forearm. KA-like pattern. EDC.   Squamous cell carcinoma of skin 12/18/2016   Right mid lat. volar forearm.WD SCC. ED[facility]   Squamous cell carcinoma of skin 01/22/2019   Right lateral distal tricep near elbow.    Squamous cell carcinoma of skin 07/14/2019   Right forearm. WD SCC. EDC   Squamous cell carcinoma of skin 09/29/2019   Right proximal mandible. SCCis   Squamous cell carcinoma of skin 08/17/2020   Left ear sup helix    SURGICAL HISTORY: Past Surgical History:  Procedure Laterality Date   ANKLE FUSION Right 2001, 2016   BONE MARROW ASPIRATION     COLONOSCOPY WITH PROPOFOL N/A 08/09/2020   Procedure: COLONOSCOPY WITH PROPOFOL;  Surgeon: Toledo, Benay Pike, MD;  Location: [facility] ENDOSCOPY;  Service: Gastroenterology;  Laterality: N/A;   FLEXIBLE SIGMOIDOSCOPY N/A 01/05/2021   Procedure: FLEXIBLE SIGMOIDOSCOPY;  Surgeon: Toledo, Benay Pike, MD;  Location: [facility] ENDOSCOPY;  Service: Gastroenterology;  Laterality: N/A;   FLEXIBLE SIGMOIDOSCOPY N/A 03/23/2021   Procedure: FLEXIBLE SIGMOIDOSCOPY;  Surgeon: Toledo, Benay Pike, MD;  Location: [facility] ENDOSCOPY;  Service: Gastroenterology;  Laterality: N/A;  CRYOTHERAPY PATIENT   FOOT ARTHRODESIS, SUBTALAR     HYDROCELE EXCISION / REPAIR     HYDROCELE EXCISION / REPAIR     JOINT REPLACEMENT Bilateral 02-2004/05-2010   KNEE ARTHROSCOPY     REVISION TOTAL HIP ARTHROPLASTY  2006   Right 2011, Left 2006    SOCIAL HISTORY: Social History   Socioeconomic History   Marital status: Married    Spouse name: Not on file   Number of children: Not on file   Years of education: Not on file   Highest education level: Not on file  Occupational History   Not on file  Tobacco Use   Smoking status: Former    Years: 20.00    Types: Cigarettes    Quit date: 09/10/1979    Years since quitting: 42.0   Smokeless  tobacco: Never  Vaping Use   Vaping Use: Never used  Substance and Sexual Activity   Alcohol use: Yes    Alcohol/week: 32.0 standard drinks    Types: 16 Glasses of wine, 16 Standard drinks or equivalent per week   Drug use: Never   Sexual activity: Not Currently  Other Topics Concern   Not on file  Social History Narrative   Not on file   Social Determinants of Health   Financial Resource Strain: Not on file  Food Insecurity: Not on file  Transportation Needs: Not on file  Physical Activity: Not on file  Stress: Not on file  Social Connections: Not on file  Intimate Partner Violence: Not on file    FAMILY HISTORY: Family History  Problem Relation Age of Onset   Hemachromatosis Sister    Bladder Cancer Neg Hx    Prostate cancer Neg Hx    Kidney cancer Neg Hx     ALLERGIES:  has No Known Allergies.  MEDICATIONS:  Current Outpatient Medications  Medication Sig Dispense Refill   Calcium Carbonate-Vitamin D (CALCIUM-VITAMIN D3 PO) Take by mouth.     loperamide (IMODIUM)  2 MG capsule Take 4 mg by mouth daily.     Multiple Vitamin (MULTI-VITAMIN) tablet Take 1 tablet by mouth daily.     simvastatin (ZOCOR) 20 MG tablet      vitamin B-12 (CYANOCOBALAMIN) 1000 MCG tablet Take 1 tablet (1,000 mcg total) by mouth daily. 90 tablet 1   No current facility-administered medications for this visit.     PHYSICAL EXAMINATION: ECOG PERFORMANCE STATUS: 1 - Symptomatic but completely ambulatory Vitals:   08/31/21 1019  BP: (!) 158/90  Pulse: 61  Resp: 16  Temp: (!) 96.4 F (35.8 C)  SpO2: 97%   Filed Weights   08/31/21 1019  Weight: 160 lb 1.6 oz (72.6 kg)    Physical Exam Constitutional:      General: He is not in acute distress. HENT:     Head: Normocephalic and atraumatic.  Eyes:     General: No scleral icterus.    Pupils: Pupils are equal, round, and reactive to light.  Cardiovascular:     Rate and Rhythm: Normal rate and regular rhythm.     Heart sounds:  Normal heart sounds.  Pulmonary:     Effort: Pulmonary effort is normal. No respiratory distress.     Breath sounds: No wheezing.  Abdominal:     General: Bowel sounds are normal. There is no distension.     Palpations: Abdomen is soft. There is no mass.     Tenderness: There is no abdominal tenderness.  Musculoskeletal:        General: No swelling or deformity. Normal range of motion.     Cervical back: Normal range of motion and neck supple.  Skin:    General: Skin is warm and dry.     Findings: No erythema or rash.  Neurological:     Mental Status: He is alert and oriented to person, place, and time. Mental status is at baseline.     Cranial Nerves: No cranial nerve deficit.  Psychiatric:        Mood and Affect: Mood normal.     LABORATORY DATA:  I have reviewed the data as listed Lab Results  Component Value Date   WBC 4.5 08/31/2021   HGB 13.6 08/31/2021   HCT 39.7 08/31/2021   MCV 105.3 (H) 08/31/2021   PLT 197 08/31/2021   Recent Labs    09/27/20 1141  PROT 7.0  ALBUMIN 4.0  AST 26  ALT 20  ALKPHOS 44  BILITOT 0.7  BILIDIR 0.1  IBILI 0.6    Iron/TIBC/Ferritin/ %Sat    Component Value Date/Time   IRON 235 (H) 08/31/2021 1000   TIBC 276 08/31/2021 1000   FERRITIN 161 08/31/2021 1000   IRONPCTSAT 85 (H) 08/31/2021 1000      RADIOGRAPHIC STUDIES: I have personally reviewed the radiological images as listed and agreed with the findings in the report.  No results found.   ASSESSMENT & PLAN:  1. Hereditary hemochromatosis ([place])   2. Malignant neoplasm of prostate ([place])   3. Alcohol use   4. Macrocytosis    #History of hereditary hemochromatosis.  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 Labs reviewed and discussed with patient Hemoglobin is normal and stable.  We discussed about a goal ferritin to be less than 100, preferably less than 50, iron saturation less than 45%. He agrees with the plan. Iron panel results are available  after his visit today.  Showed ferritin of 161, iron saturation 85 I recommend patient to proceed with phlebotomy 500  cc x1, and repeat another phlebotomy session in 2 to 3 weeks.   Alcohol cessation was discussed with patient.  Patient's iron panel could be falsely elevated due to chronic alcohol use. Patient has had not had any liver images recently.  I will repeat ultrasound right upper quadrant for evaluation.  #History of prostate cancer, status post radiation.  Follows up with Dr. Leda Quail. Erlene Quan.. 04/08/2021 PSA is <0.1.  Continue follow-up with urology in radiation oncology.  #Macrocytosis 1/29/20201 normal vitamin B12 and folate level. This is likely secondary to chronic alcohol use.  Continue vitamin B12 supplementation.   All questions were answered. The patient knows to call the clinic with any problems questions or concerns. Return of visit: 3 months and 6 months.  Lab MD, CBC and iron.  TIBC ferritin + phlebotomy  Earlie Server, MD, PhD Hematology Oncology [facility] Pager- [phone] 08/31/2021

## 2021-09-01 ENCOUNTER — Telehealth: Payer: Self-pay

## 2021-09-01 NOTE — Telephone Encounter (Signed)
Please schedule as requested by MD and inform pt of appts

## 2021-09-01 NOTE — Telephone Encounter (Signed)
-----   Message from Earlie Server, MD sent at 08/31/2021 11:16 PM EST ----- Please arrange him to get phlebotomy x 1 this or next week. Repeat another phlebotomy 2 weeks after this one. Thanks Keep same follow up plan

## 2021-09-09 ENCOUNTER — Other Ambulatory Visit: Payer: Self-pay

## 2021-09-09 ENCOUNTER — Inpatient Hospital Stay: Payer: Medicare HMO

## 2021-09-09 ENCOUNTER — Ambulatory Visit
Admission: RE | Admit: 2021-09-09 | Discharge: 2021-09-09 | Disposition: A | Discharge status: Home / Self Care | Payer: Medicare HMO | Source: Ambulatory Visit | Attending: Oncology | Admitting: Oncology

## 2021-09-09 DIAGNOSIS — Z923 Personal history of irradiation: Secondary | ICD-10-CM | POA: Diagnosis not present

## 2021-09-09 DIAGNOSIS — Z87891 Personal history of nicotine dependence: Secondary | ICD-10-CM | POA: Diagnosis not present

## 2021-09-09 DIAGNOSIS — C61 Malignant neoplasm of prostate: Secondary | ICD-10-CM | POA: Diagnosis not present

## 2021-09-09 DIAGNOSIS — Z79899 Other long term (current) drug therapy: Secondary | ICD-10-CM | POA: Diagnosis not present

## 2021-09-09 DIAGNOSIS — D7589 Other specified diseases of blood and blood-forming organs: Secondary | ICD-10-CM | POA: Diagnosis not present

## 2021-09-09 MED ORDER — SODIUM CHLORIDE 0.9 % IV SOLN
Freq: Once | INTRAVENOUS | Status: AC
Start: 2021-09-09 — End: 2021-09-09
  Filled 2021-09-09: qty 250

## 2021-09-09 NOTE — Patient Instructions (Signed)

## 2021-09-09 NOTE — Progress Notes (Signed)
Pt had 500 ml phlebotomy performed in L AC with  20 gauge angiocath. Patient received  immediate Fluid replacement of 500 ml of NS. Patient tolerated procedure well. VSS. Discharged to home.

## 2021-09-23 ENCOUNTER — Inpatient Hospital Stay: Payer: Medicare HMO | Attending: Oncology

## 2021-09-29 DIAGNOSIS — R197 Diarrhea, unspecified: Secondary | ICD-10-CM | POA: Diagnosis not present

## 2021-09-29 DIAGNOSIS — I10 Essential (primary) hypertension: Secondary | ICD-10-CM | POA: Diagnosis not present

## 2021-09-29 DIAGNOSIS — R159 Full incontinence of feces: Secondary | ICD-10-CM | POA: Diagnosis not present

## 2021-09-29 DIAGNOSIS — K627 Radiation proctitis: Secondary | ICD-10-CM | POA: Diagnosis not present

## 2021-09-29 DIAGNOSIS — K921 Melena: Secondary | ICD-10-CM | POA: Diagnosis not present

## 2021-09-30 DIAGNOSIS — R197 Diarrhea, unspecified: Secondary | ICD-10-CM | POA: Diagnosis not present

## 2021-09-30 DIAGNOSIS — R159 Full incontinence of feces: Secondary | ICD-10-CM | POA: Diagnosis not present

## 2021-10-12 DIAGNOSIS — H524 Presbyopia: Secondary | ICD-10-CM | POA: Diagnosis not present

## 2021-10-12 DIAGNOSIS — H2513 Age-related nuclear cataract, bilateral: Secondary | ICD-10-CM | POA: Diagnosis not present

## 2021-10-18 DIAGNOSIS — E785 Hyperlipidemia, unspecified: Secondary | ICD-10-CM | POA: Diagnosis not present

## 2021-10-18 DIAGNOSIS — R159 Full incontinence of feces: Secondary | ICD-10-CM | POA: Diagnosis not present

## 2021-10-18 DIAGNOSIS — I10 Essential (primary) hypertension: Secondary | ICD-10-CM | POA: Diagnosis not present

## 2021-10-18 DIAGNOSIS — K627 Radiation proctitis: Secondary | ICD-10-CM | POA: Diagnosis not present

## 2021-11-01 DIAGNOSIS — M1991 Primary osteoarthritis, unspecified site: Secondary | ICD-10-CM | POA: Diagnosis not present

## 2021-11-01 DIAGNOSIS — M545 Low back pain, unspecified: Secondary | ICD-10-CM | POA: Diagnosis not present

## 2021-11-01 DIAGNOSIS — M25551 Pain in right hip: Secondary | ICD-10-CM | POA: Diagnosis not present

## 2021-11-16 DIAGNOSIS — M5416 Radiculopathy, lumbar region: Secondary | ICD-10-CM | POA: Diagnosis not present

## 2021-11-21 ENCOUNTER — Other Ambulatory Visit: Payer: Self-pay | Admitting: *Deleted

## 2021-11-28 ENCOUNTER — Inpatient Hospital Stay: Payer: Medicare HMO | Attending: Oncology

## 2021-11-28 ENCOUNTER — Other Ambulatory Visit: Payer: Self-pay

## 2021-11-28 DIAGNOSIS — D649 Anemia, unspecified: Secondary | ICD-10-CM | POA: Insufficient documentation

## 2021-11-28 DIAGNOSIS — Z79899 Other long term (current) drug therapy: Secondary | ICD-10-CM | POA: Insufficient documentation

## 2021-11-28 DIAGNOSIS — Z87891 Personal history of nicotine dependence: Secondary | ICD-10-CM | POA: Insufficient documentation

## 2021-11-28 DIAGNOSIS — C61 Malignant neoplasm of prostate: Secondary | ICD-10-CM | POA: Insufficient documentation

## 2021-11-28 DIAGNOSIS — Z923 Personal history of irradiation: Secondary | ICD-10-CM | POA: Diagnosis not present

## 2021-11-28 LAB — CBC WITH DIFFERENTIAL/PLATELET
Abs Immature Granulocytes: 0.02 10*3/uL (ref 0.00–0.07)
Basophils Absolute: 0 10*3/uL (ref 0.0–0.1)
Basophils Relative: 1 %
Eosinophils Absolute: 0.1 10*3/uL (ref 0.0–0.5)
Eosinophils Relative: 2 %
HCT: 40.4 % (ref 39.0–52.0)
Hemoglobin: 13.9 g/dL (ref 13.0–17.0)
Immature Granulocytes: 0 %
Lymphocytes Relative: 20 %
Lymphs Abs: 1.2 10*3/uL (ref 0.7–4.0)
MCH: 36.2 pg — ABNORMAL HIGH (ref 26.0–34.0)
MCHC: 34.4 g/dL (ref 30.0–36.0)
MCV: 105.2 fL — ABNORMAL HIGH (ref 80.0–100.0)
Monocytes Absolute: 0.6 10*3/uL (ref 0.1–1.0)
Monocytes Relative: 10 %
Neutro Abs: 4 10*3/uL (ref 1.7–7.7)
Neutrophils Relative %: 67 %
Platelets: 203 10*3/uL (ref 150–400)
RBC: 3.84 MIL/uL — ABNORMAL LOW (ref 4.22–5.81)
RDW: 12.3 % (ref 11.5–15.5)
WBC: 5.9 10*3/uL (ref 4.0–10.5)
nRBC: 0 % (ref 0.0–0.2)

## 2021-11-28 LAB — IRON AND TIBC
Iron: 227 ug/dL — ABNORMAL HIGH (ref 45–182)
Saturation Ratios: 78 % — ABNORMAL HIGH (ref 17.9–39.5)
TIBC: 293 ug/dL (ref 250–450)
UIBC: 66 ug/dL

## 2021-11-28 LAB — FERRITIN: Ferritin: 262 ng/mL (ref 24–336)

## 2021-11-29 DIAGNOSIS — M25551 Pain in right hip: Secondary | ICD-10-CM | POA: Diagnosis not present

## 2021-11-29 DIAGNOSIS — M545 Low back pain, unspecified: Secondary | ICD-10-CM | POA: Diagnosis not present

## 2021-11-29 DIAGNOSIS — M48062 Spinal stenosis, lumbar region with neurogenic claudication: Secondary | ICD-10-CM | POA: Diagnosis not present

## 2021-11-29 DIAGNOSIS — M1991 Primary osteoarthritis, unspecified site: Secondary | ICD-10-CM | POA: Diagnosis not present

## 2021-11-29 DIAGNOSIS — M25559 Pain in unspecified hip: Secondary | ICD-10-CM | POA: Diagnosis not present

## 2021-11-30 ENCOUNTER — Inpatient Hospital Stay: Payer: Medicare HMO

## 2021-11-30 ENCOUNTER — Encounter: Payer: Self-pay | Admitting: Oncology

## 2021-11-30 ENCOUNTER — Inpatient Hospital Stay: Payer: Medicare HMO | Admitting: Oncology

## 2021-11-30 ENCOUNTER — Other Ambulatory Visit: Payer: Self-pay

## 2021-11-30 DIAGNOSIS — Z789 Other specified health status: Secondary | ICD-10-CM

## 2021-11-30 DIAGNOSIS — D649 Anemia, unspecified: Secondary | ICD-10-CM | POA: Diagnosis not present

## 2021-11-30 DIAGNOSIS — Z79899 Other long term (current) drug therapy: Secondary | ICD-10-CM | POA: Diagnosis not present

## 2021-11-30 DIAGNOSIS — D7589 Other specified diseases of blood and blood-forming organs: Secondary | ICD-10-CM | POA: Diagnosis not present

## 2021-11-30 DIAGNOSIS — C61 Malignant neoplasm of prostate: Secondary | ICD-10-CM | POA: Diagnosis not present

## 2021-11-30 DIAGNOSIS — Z87891 Personal history of nicotine dependence: Secondary | ICD-10-CM | POA: Diagnosis not present

## 2021-11-30 DIAGNOSIS — Z923 Personal history of irradiation: Secondary | ICD-10-CM | POA: Diagnosis not present

## 2021-11-30 MED ORDER — SODIUM CHLORIDE 0.9 % IV SOLN
Freq: Once | INTRAVENOUS | Status: AC
  Filled 2021-11-30: qty 250

## 2021-11-30 NOTE — Progress Notes (Signed)
Removed 500 ml of blood per phlebotomy orders. Pt gets 500 ml NS for Fluid replacement. Pt tolerated procedure well. VSS. Feeling well. Discharged to home. ?

## 2021-11-30 NOTE — Patient Instructions (Signed)

## 2021-11-30 NOTE — Progress Notes (Signed)
?Hematology/Oncology Progress note ?[phone] [phone] ?  ? ? ? ?Patient Care Team: ?Adin Hector, MD as PCP - General (Internal Medicine) ?Earlie Server, MD as Consulting Physician (Oncology) ? ?REFERRING PROVIDER: ?Adin Hector, MD ? ?CHIEF COMPLAINTS/REASON FOR VISIT:  ?hemochromatosis management ? ?HISTORY OF PRESENTING ILLNESS:  ?Andrew Walsh is a 81 y.o. male who was seen in consultation at the request of Adin Hector, MD for evaluation of elevated ferritin level/hemochromatosis management. ?Patient reports a history  hereditary hemochromatosis.  He was diagnosed in 54s by his previous primary care provider in [place] and he has been an active blood donor every 8 to 10 weeks for many years. ? ?Recently he was diagnosed with prostate cancer Gleason score 4+3 with a PSA of 9.4 and underwent IMRT radiation by Dr. Baruch Gouty and he finished in September 2020. ?He is mildly anemic and also cannot further donate blood due to recent radiation and was referred to me for management of hemochromatosis and iron overload. ? ? ?Context ?Shortness of breath: Patient reports mild shortness of breath after exertion recently and he attributes to Covid pandemic and not able to be physically active lately. ?Fatigue: denies ?He had blood work done at [facility] primary care provider's office recently. ?08/19/2019, hemoglobin 14, hematocrit 40.7, MCV 105.7. ?Iron 254, ferritin 514,  ?07/18/2019, iron 259, ferritin 476, transferrin 193.5, TIBC 270, iron saturation 96 ? ?Patient was seen by gastroenterology Dr. [name] on 10/07/2020, his hematochezia was considered to be secondary to radiation-induced proctitis with telangiectasia.  Patient had a colonoscopy with [facility] on 08/09/2020. ?Patient was recommended to use Imodium.  As patient continues to have symptoms, he has discussed with Dr. Ricky Stabs team in early April 2022 and was advised to use Carafate enema twice daily, ? ?INTERVAL  HISTORY ?Andrew Walsh is a 81 y.o. male who has above history reviewed by me today presents for follow up visit for management of iron overload/hemochromatosis. ?He reports feeling weak for a few days after phlebotomy. ?He has no new complaints today. ? ? ?Review of Systems  ?Constitutional:  Negative for appetite change, chills, fatigue, fever and unexpected weight change.  ?HENT:   Negative for hearing loss and voice change.   ?Eyes:  Negative for eye problems and icterus.  ?Respiratory:  Negative for chest tightness, cough and shortness of breath.   ?Cardiovascular:  Negative for chest pain and leg swelling.  ?Gastrointestinal:  Negative for abdominal distention, abdominal pain, blood in stool and diarrhea.  ?Endocrine: Negative for hot flashes.  ?Genitourinary:  Negative for difficulty urinating, dysuria and frequency.   ?Musculoskeletal:  Negative for arthralgias.  ?Skin:  Negative for itching and rash.  ?Neurological:  Negative for light-headedness and numbness.  ?Hematological:  Negative for adenopathy. Does not bruise/bleed easily.  ?Psychiatric/Behavioral:  Negative for confusion.   ? ?MEDICAL HISTORY:  ?Past Medical History:  ?Diagnosis Date  ? Actinic keratosis 01/31/2016  ? Left lateral neck post auricular. AK and prurigo nodularis  ? Actinic keratosis 01/31/2016  ? Left lateral neck post auricular, inferior. AK and prurigo nodularis  ? Actinic keratosis 12/19/2017  ? Left ear anti-helix  ? Ankle arthropathy 02/24/2014  ? Arthritis of foot, degenerative 10/03/2012  ? Overview:  Subtalar arthritis   ? Basal cell carcinoma 04/30/2008  ? Left mid dorsum nose.  ? Basal cell carcinoma 07/12/2009  ? Left post. deltoid sup. lat. edge of cancer scar.   ? Cancer [facility])   ? PROSTATE  ?  Elevated PSA   ? Hereditary hemochromatosis ([place]) 11/03/2015  ? Overview:  Treated by regular blood donation   ? History of kidney stones   ? HLD (hyperlipidemia) 11/03/2015  ? Hyperlipidemia   ? Inguinal hernia   ? Localized, primary  osteoarthritis of ankle or foot 02/24/2014  ? Osteoarthritis   ? Squamous cell carcinoma of skin 05/17/2010  ? Left forearm. KA-like pattern. EDC.  ? Squamous cell carcinoma of skin 12/18/2016  ? Right mid lat. volar forearm.WD SCC. EDC  ? Squamous cell carcinoma of skin 01/22/2019  ? Right lateral distal tricep near elbow.   ? Squamous cell carcinoma of skin 07/14/2019  ? Right forearm. WD SCC. EDC  ? Squamous cell carcinoma of skin 09/29/2019  ? Right proximal mandible. SCCis  ? Squamous cell carcinoma of skin 08/17/2020  ? Left ear sup helix  ? ? ?SURGICAL HISTORY: ?Past Surgical History:  ?Procedure Laterality Date  ? ANKLE FUSION Right 2001, 2016  ? BONE MARROW ASPIRATION    ? COLONOSCOPY WITH PROPOFOL N/A 08/09/2020  ? Procedure: COLONOSCOPY WITH PROPOFOL;  Surgeon: Toledo, Benay Pike, MD;  Location: [facility] ENDOSCOPY;  Service: Gastroenterology;  Laterality: N/A;  ? FLEXIBLE SIGMOIDOSCOPY N/A 01/05/2021  ? Procedure: FLEXIBLE SIGMOIDOSCOPY;  Surgeon: Toledo, Benay Pike, MD;  Location: [facility] ENDOSCOPY;  Service: Gastroenterology;  Laterality: N/A;  ? FLEXIBLE SIGMOIDOSCOPY N/A 03/23/2021  ? Procedure: FLEXIBLE SIGMOIDOSCOPY;  Surgeon: Toledo, Benay Pike, MD;  Location: [facility] ENDOSCOPY;  Service: Gastroenterology;  Laterality: N/A;  CRYOTHERAPY PATIENT  ? FOOT ARTHRODESIS, SUBTALAR    ? HYDROCELE EXCISION / REPAIR    ? HYDROCELE EXCISION / REPAIR    ? JOINT REPLACEMENT Bilateral 02-2004/05-2010  ? KNEE ARTHROSCOPY    ? REVISION TOTAL HIP ARTHROPLASTY  2006  ? Right 2011, Left 2006  ? ? ?SOCIAL HISTORY: ?Social History  ? ?Socioeconomic History  ? Marital status: Married  ?  Spouse name: Not on file  ? Number of children: Not on file  ? Years of education: Not on file  ? Highest education level: Not on file  ?Occupational History  ? Not on file  ?Tobacco Use  ? Smoking status: Former  ?  Years: 20.00  ?  Types: Cigarettes  ?  Quit date: 09/10/1979  ?  Years since quitting: 42.2  ? Smokeless tobacco: Never  ?Vaping Use  ?  Vaping Use: Never used  ?Substance and Sexual Activity  ? Alcohol use: Yes  ?  Alcohol/week: 32.0 standard drinks  ?  Types: 16 Glasses of wine, 16 Standard drinks or equivalent per week  ? Drug use: Never  ? Sexual activity: Not Currently  ?Other Topics Concern  ? Not on file  ?Social History Narrative  ? Not on file  ? ?Social Determinants of Health  ? ?Financial Resource Strain: Not on file  ?Food Insecurity: Not on file  ?Transportation Needs: Not on file  ?Physical Activity: Not on file  ?Stress: Not on file  ?Social Connections: Not on file  ?Intimate Partner Violence: Not on file  ? ? ?FAMILY HISTORY: ?Family History  ?Problem Relation Age of Onset  ? Hemachromatosis Sister   ? Bladder Cancer Neg Hx   ? Prostate cancer Neg Hx   ? Kidney cancer Neg Hx   ? ? ?ALLERGIES:  has No Known Allergies. ? ?MEDICATIONS:  ?Current Outpatient Medications  ?Medication Sig Dispense Refill  ? Calcium Carbonate-Vitamin D (CALCIUM-VITAMIN D3 PO) Take by mouth.    ? colesevelam (WELCHOL) 625 MG tablet  colesevelam 625 mg tablet ? TAKE THREE TABLETS BY MOUTH TWO TIMES DAILY WITH MEALS FOR 30 DAYS    ? loperamide (IMODIUM) 2 MG capsule Take 4 mg by mouth daily.    ? Multiple Vitamin (MULTI-VITAMIN) tablet Take 1 tablet by mouth daily.    ? simvastatin (ZOCOR) 20 MG tablet     ? vitamin B-12 (CYANOCOBALAMIN) 1000 MCG tablet Take 1 tablet (1,000 mcg total) by mouth daily. 90 tablet 1  ? ?No current facility-administered medications for this visit.  ? ? ? ?PHYSICAL EXAMINATION: ?ECOG PERFORMANCE STATUS: 1 - Symptomatic but completely ambulatory ?Vitals:  ? 11/30/21 1343  ?BP: (!) 171/81  ?Pulse: (!) 52  ?Resp: 16  ?Temp: (!) 97 ?F (36.1 ?C)  ?SpO2: 100%  ? ?Filed Weights  ? 11/30/21 1343  ?Weight: 165 lb (74.8 kg)  ? ? ?Physical Exam ?Constitutional:   ?   General: He is not in acute distress. ?HENT:  ?   Head: Normocephalic and atraumatic.  ?Eyes:  ?   General: No scleral icterus. ?   Pupils: Pupils are equal, round, and reactive to  light.  ?Cardiovascular:  ?   Rate and Rhythm: Normal rate and regular rhythm.  ?   Heart sounds: Normal heart sounds.  ?Pulmonary:  ?   Effort: Pulmonary effort is normal. No respiratory distress.  ?   Breath s

## 2021-11-30 NOTE — Progress Notes (Signed)
Pt in for follow up, denies any concerns today. 

## 2021-12-05 DIAGNOSIS — C61 Malignant neoplasm of prostate: Secondary | ICD-10-CM | POA: Diagnosis not present

## 2021-12-05 DIAGNOSIS — M519 Unspecified thoracic, thoracolumbar and lumbosacral intervertebral disc disorder: Secondary | ICD-10-CM | POA: Diagnosis not present

## 2021-12-05 DIAGNOSIS — M48062 Spinal stenosis, lumbar region with neurogenic claudication: Secondary | ICD-10-CM | POA: Diagnosis not present

## 2021-12-21 ENCOUNTER — Inpatient Hospital Stay: Payer: Medicare HMO

## 2021-12-21 ENCOUNTER — Inpatient Hospital Stay: Payer: Medicare HMO | Attending: Oncology

## 2021-12-21 DIAGNOSIS — Z923 Personal history of irradiation: Secondary | ICD-10-CM | POA: Insufficient documentation

## 2021-12-21 DIAGNOSIS — Z8546 Personal history of malignant neoplasm of prostate: Secondary | ICD-10-CM | POA: Insufficient documentation

## 2021-12-21 DIAGNOSIS — D649 Anemia, unspecified: Secondary | ICD-10-CM | POA: Insufficient documentation

## 2021-12-21 LAB — CBC WITH DIFFERENTIAL/PLATELET
Abs Immature Granulocytes: 0.02 10*3/uL (ref 0.00–0.07)
Basophils Absolute: 0 10*3/uL (ref 0.0–0.1)
Basophils Relative: 1 %
Eosinophils Absolute: 0.2 10*3/uL (ref 0.0–0.5)
Eosinophils Relative: 3 %
HCT: 38.4 % — ABNORMAL LOW (ref 39.0–52.0)
Hemoglobin: 12.8 g/dL — ABNORMAL LOW (ref 13.0–17.0)
Immature Granulocytes: 0 %
Lymphocytes Relative: 23 %
Lymphs Abs: 1.1 10*3/uL (ref 0.7–4.0)
MCH: 35.7 pg — ABNORMAL HIGH (ref 26.0–34.0)
MCHC: 33.3 g/dL (ref 30.0–36.0)
MCV: 107 fL — ABNORMAL HIGH (ref 80.0–100.0)
Monocytes Absolute: 0.5 10*3/uL (ref 0.1–1.0)
Monocytes Relative: 11 %
Neutro Abs: 2.9 10*3/uL (ref 1.7–7.7)
Neutrophils Relative %: 62 %
Platelets: 183 10*3/uL (ref 150–400)
RBC: 3.59 MIL/uL — ABNORMAL LOW (ref 4.22–5.81)
RDW: 13.2 % (ref 11.5–15.5)
WBC: 4.7 10*3/uL (ref 4.0–10.5)
nRBC: 0 % (ref 0.0–0.2)

## 2021-12-21 MED ORDER — SODIUM CHLORIDE 0.9 % IV SOLN
Freq: Once | INTRAVENOUS | Status: AC
  Filled 2021-12-21: qty 250

## 2021-12-21 NOTE — Progress Notes (Signed)
Hgb 12.9 today. Pt received 500 ml phlebotomy. Blood removed from L AC.Tolerated well. Ferritin 262 on 11/28/21. Pt tolerated well. Pt discussed trying the phlebotomy without post fluids. I decide we would try 250 ml NS replacement instead of 500 ml. Pt in agreement. VSS. Feeling very well at time of discharge. ?

## 2021-12-21 NOTE — Patient Instructions (Signed)

## 2021-12-22 DIAGNOSIS — M48062 Spinal stenosis, lumbar region with neurogenic claudication: Secondary | ICD-10-CM | POA: Diagnosis not present

## 2021-12-22 DIAGNOSIS — M438X9 Other specified deforming dorsopathies, site unspecified: Secondary | ICD-10-CM | POA: Diagnosis not present

## 2021-12-23 ENCOUNTER — Other Ambulatory Visit: Payer: Self-pay | Admitting: Neurosurgery

## 2021-12-23 DIAGNOSIS — Z01818 Encounter for other preprocedural examination: Secondary | ICD-10-CM

## 2021-12-29 ENCOUNTER — Ambulatory Visit: Payer: Medicare HMO | Admitting: Dermatology

## 2021-12-29 DIAGNOSIS — L578 Other skin changes due to chronic exposure to nonionizing radiation: Secondary | ICD-10-CM

## 2021-12-29 DIAGNOSIS — L570 Actinic keratosis: Secondary | ICD-10-CM | POA: Diagnosis not present

## 2021-12-29 DIAGNOSIS — L814 Other melanin hyperpigmentation: Secondary | ICD-10-CM | POA: Diagnosis not present

## 2021-12-29 DIAGNOSIS — Z85828 Personal history of other malignant neoplasm of skin: Secondary | ICD-10-CM

## 2021-12-29 DIAGNOSIS — Z1283 Encounter for screening for malignant neoplasm of skin: Secondary | ICD-10-CM

## 2021-12-29 DIAGNOSIS — L821 Other seborrheic keratosis: Secondary | ICD-10-CM

## 2021-12-29 DIAGNOSIS — L820 Inflamed seborrheic keratosis: Secondary | ICD-10-CM | POA: Diagnosis not present

## 2021-12-29 DIAGNOSIS — D229 Melanocytic nevi, unspecified: Secondary | ICD-10-CM

## 2021-12-29 DIAGNOSIS — D1800 Hemangioma unspecified site: Secondary | ICD-10-CM

## 2021-12-29 NOTE — Patient Instructions (Signed)
Cryotherapy Aftercare ? ?Wash gently with soap and water everyday.   ?Apply Vaseline and Band-Aid daily until healed.  ? ? ?Wound Care Instructions ? ?Cleanse wound gently with soap and water once a day then pat dry with clean gauze. Apply a thing coat of Petrolatum (petroleum jelly, "Vaseline") over the wound (unless you have an allergy to this). We recommend that you use a new, sterile tube of Vaseline. Do not pick or remove scabs. Do not remove the yellow or white "healing tissue" from the base of the wound. ? ?Cover the wound with fresh, clean, nonstick gauze and secure with paper tape. You may use Band-Aids in place of gauze and tape if the would is small enough, but would recommend trimming much of the tape off as there is often too much. Sometimes Band-Aids can irritate the skin. ? ?You should call the office for your biopsy report after 1 week if you have not already been contacted. ? ?If you experience any problems, such as abnormal amounts of bleeding, swelling, significant bruising, significant pain, or evidence of infection, please call the office immediately. ? ?FOR ADULT SURGERY PATIENTS: If you need something for pain relief you may take 1 extra strength Tylenol (acetaminophen) AND 2 Ibuprofen ('200mg'$  each) together every 4 hours as needed for pain. (do not take these if you are allergic to them or if you have a reason you should not take them.) Typically, you may only need pain medication for 1 to 3 days.  ? ? ?

## 2021-12-29 NOTE — Progress Notes (Signed)
? ?Follow-Up Visit ?  ?Subjective  ?Andrew Walsh is a 81 y.o. male who presents for the following: Annual Exam (History of BCC, SCC - TBSE today/). ?The patient presents for Total-Body Skin Exam (TBSE) for skin cancer screening and mole check.  The patient has spots, moles and lesions to be evaluated, some may be new or changing and the patient has concerns that these could be cancer. ? ?The following portions of the chart were reviewed this encounter and updated as appropriate:  ? Tobacco  Allergies  Meds  Problems  Med Hx  Surg Hx  Fam Hx   ?  ?Review of Systems:  No other skin or systemic complaints except as noted in HPI or Assessment and Plan. ? ?Objective  ?Well appearing patient in no apparent distress; mood and affect are within normal limits. ? ?A full examination was performed including scalp, head, eyes, ears, nose, lips, neck, chest, axillae, abdomen, back, buttocks, bilateral upper extremities, bilateral lower extremities, hands, feet, fingers, toes, fingernails, and toenails. All findings within normal limits unless otherwise noted below. ? ?Left back x 2, right ant medial ankle x 1 (3) ?Erythematous stuck-on, waxy papule or plaque ? ?Right Hand - Posterior (3) ?Erythematous thin papules/macules with gritty scale.  ? ? ?Assessment & Plan  ? ?History of Basal Cell Carcinoma of the Skin ?- No evidence of recurrence today ?- Recommend regular full body skin exams ?- Recommend daily broad spectrum sunscreen SPF 30+ to sun-exposed areas, reapply every 2 hours as needed.  ?- Call if any new or changing lesions are noted between office visits ? ?History of Squamous Cell Carcinoma of the Skin ?- No evidence of recurrence today ?- No lymphadenopathy ?- Recommend regular full body skin exams ?- Recommend daily broad spectrum sunscreen SPF 30+ to sun-exposed areas, reapply every 2 hours as needed.  ?- Call if any new or changing lesions are noted between office visits ? ?Lentigines ?- Scattered tan  macules ?- Due to sun exposure ?- Benign-appearing, observe ?- Recommend daily broad spectrum sunscreen SPF 30+ to sun-exposed areas, reapply every 2 hours as needed. ?- Call for any changes ? ?Seborrheic Keratoses ?- Stuck-on, waxy, tan-brown papules and/or plaques  ?- Benign-appearing ?- Discussed benign etiology and prognosis. ?- Observe ?- Call for any changes ? ?Melanocytic Nevi ?- Tan-brown and/or pink-flesh-colored symmetric macules and papules ?- Benign appearing on exam today ?- Observation ?- Call clinic for new or changing moles ?- Recommend daily use of broad spectrum spf 30+ sunscreen to sun-exposed areas.  ? ?Hemangiomas ?- Red papules ?- Discussed benign nature ?- Observe ?- Call for any changes ? ?Actinic Damage ?- Chronic condition, secondary to cumulative UV/sun exposure ?- diffuse scaly erythematous macules with underlying dyspigmentation ?- Recommend daily broad spectrum sunscreen SPF 30+ to sun-exposed areas, reapply every 2 hours as needed.  ?- Staying in the shade or wearing long sleeves, sun glasses (UVA+UVB protection) and wide brim hats (4-inch brim around the entire circumference of the hat) are also recommended for sun protection.  ?- Call for new or changing lesions. ? ?Skin cancer screening performed today. ? ?Inflamed seborrheic keratosis (3) ?Left back x 2, right ant medial ankle x 1 ? ?Destruction of lesion - Left back x 2, right ant medial ankle x 1 ?Complexity: simple   ?Destruction method: cryotherapy   ?Informed consent: discussed and consent obtained   ?Timeout:  patient name, date of birth, surgical site, and procedure verified ?Lesion destroyed using liquid nitrogen: Yes   ?  Region frozen until ice ball extended beyond lesion: Yes   ?Outcome: patient tolerated procedure well with no complications   ?Post-procedure details: wound care instructions given   ? ?AK (actinic keratosis) (3) ?Right Hand - Posterior ? ?Destruction of lesion - Right Hand - Posterior ?Complexity: simple    ?Destruction method: cryotherapy   ?Informed consent: discussed and consent obtained   ?Timeout:  patient name, date of birth, surgical site, and procedure verified ?Lesion destroyed using liquid nitrogen: Yes   ?Region frozen until ice ball extended beyond lesion: Yes   ?Outcome: patient tolerated procedure well with no complications   ?Post-procedure details: wound care instructions given   ? ? ?Return in about 6 months (around 06/30/2022) for UBSE. ? ?I, Ashok Cordia, CMA, am acting as scribe for Sarina Ser, MD . ?Documentation: I have reviewed the above documentation for accuracy and completeness, and I agree with the above. ? ?Sarina Ser, MD ? ?

## 2022-01-02 ENCOUNTER — Other Ambulatory Visit: Payer: Self-pay

## 2022-01-02 ENCOUNTER — Encounter
Admission: RE | Admit: 2022-01-02 | Discharge: 2022-01-02 | Disposition: A | Discharge status: Home / Self Care | Payer: Medicare HMO | Source: Ambulatory Visit | Attending: Neurosurgery | Admitting: Neurosurgery

## 2022-01-02 VITALS — BP 166/83 | HR 46 | Temp 97.5°F | Ht 67.0 in | Wt 164.9 lb

## 2022-01-02 DIAGNOSIS — E785 Hyperlipidemia, unspecified: Secondary | ICD-10-CM | POA: Diagnosis not present

## 2022-01-02 DIAGNOSIS — Z01810 Encounter for preprocedural cardiovascular examination: Secondary | ICD-10-CM | POA: Diagnosis not present

## 2022-01-02 DIAGNOSIS — E7849 Other hyperlipidemia: Secondary | ICD-10-CM | POA: Diagnosis not present

## 2022-01-02 DIAGNOSIS — Z01818 Encounter for other preprocedural examination: Secondary | ICD-10-CM | POA: Diagnosis not present

## 2022-01-02 LAB — TYPE AND SCREEN
ABO/RH(D): O POS
Antibody Screen: NEGATIVE

## 2022-01-02 LAB — BASIC METABOLIC PANEL
Anion gap: 8 (ref 5–15)
BUN: 11 mg/dL (ref 8–23)
CO2: 27 mmol/L (ref 22–32)
Calcium: 9.2 mg/dL (ref 8.9–10.3)
Chloride: 104 mmol/L (ref 98–111)
Creatinine, Ser: 0.79 mg/dL (ref 0.61–1.24)
GFR, Estimated: >60 mL/min (ref >60)
Glucose, Bld: 98 mg/dL (ref 70–99)
Potassium: 4.5 mmol/L (ref 3.5–5.1)
Sodium: 139 mmol/L (ref 135–145)

## 2022-01-02 LAB — PROTIME-INR
INR: 1.2 (ref 0.8–1.2)
Prothrombin Time: 14.6 seconds (ref 11.4–15.2)

## 2022-01-02 LAB — APTT: aPTT: 30 seconds (ref 24–36)

## 2022-01-02 NOTE — Patient Instructions (Addendum)
Your procedure is scheduled on: 01/13/2022  ?Report to the Registration Desk on the 1st floor of the [facility]. ?To find out your arrival time, please call [phone] between 1PM - 3PM on: 01/12/2022  ? ?REMEMBER: ?Instructions that are not followed completely may result in serious medical risk, up to and including death; or upon the discretion of your surgeon and anesthesiologist your surgery may need to be rescheduled. ? ?Do not eat food after midnight the night before surgery.  ?No gum chewing, lozengers or hard candies. ? ?You may however, drink CLEAR liquids up to 2 hours before you are scheduled to arrive for your surgery. Do not drink anything within 2 hours of your scheduled arrival time. ? ?Clear liquids include: ?- water  ?- apple juice without pulp ?- gatorade (not RED colors) ?- black coffee or tea (Do NOT add milk or creamers to the coffee or tea) ?Do NOT drink anything that is not on this list. ? ? ? ?TAKE THESE MEDICATIONS THE MORNING OF SURGERY WITH A SIP OF WATER: ?zocor ? ? ?One week prior to surgery: ?Stop Anti-inflammatories (NSAIDS) such as Advil, Aleve, Ibuprofen, Motrin, Naproxen, Naprosyn and Aspirin based products such as Excedrin, Goodys Powder, BC Powder. ? ?Stop ANY OVER THE COUNTER supplements until after surgery vitamin C, calcium carbonate, glucosamine, Vit . A, vit B-12 ?You may however, continue to take Tylenol if needed for pain up until the day of surgery. ? ?No Alcohol for 24 hours before or after surgery. ? ?No Smoking including e-cigarettes for 24 hours prior to surgery.  ?No chewable tobacco products for at least 6 hours prior to surgery.  ?No nicotine patches on the day of surgery. ? ?Do not use any "recreational" drugs for at least a week prior to your surgery.  ?Please be advised that the combination of cocaine and anesthesia may have negative outcomes, up to and including death. ?If you test positive for cocaine, your surgery will be cancelled. ? ?On the morning of  surgery brush your teeth with toothpaste and water, you may rinse your mouth with mouthwash if you wish. ?Do not swallow any toothpaste or mouthwash. ? ?Use CHG Soap or wipes with shower as directed-   provided for you. ? ?Do not wear jewelry, make-up, hairpins, clips or nail polish. ? ?Do not wear lotions, powders, or perfumes.  ? ?Do not shave body from the neck down 48 hours prior to surgery just in case you cut yourself which could leave a site for infection.  ?Also, freshly shaved skin may become irritated if using the CHG soap. ? ?Contact lenses, hearing aids and dentures may not be worn into surgery. ? ?Do not bring valuables to the hospital. [facility] is not responsible for any missing/lost belongings or valuables.  ? ? ?Notify your doctor if there is any change in your medical condition (cold, fever, infection). ? ?Wear comfortable clothing (specific to your surgery type) to the hospital. ? ?After surgery, you can help prevent lung complications by doing breathing exercises.  ?Take deep breaths and cough every 1-2 hours. Your doctor may order a device called an Incentive Spirometer to help you take deep breaths. ? ? ?If you are being admitted to the hospital overnight, leave your suitcase in the car. ?After surgery it may be brought to your room. ? ?If you are being discharged the day of surgery, you will not be allowed to drive home. ?You will need a responsible adult (18 years or older) to drive  you home and stay with you that night.  ? ?If you are taking public transportation, you will need to have a responsible adult (18 years or older) with you. ?Please confirm with your physician that it is acceptable to use public transportation.  ? ?Please call the [facility] at [phone] if you have any questions about these instructions. ? ?Surgery Visitation Policy: ? ?Patients undergoing a surgery or procedure may have two family members or support persons with them as long as the person  is not COVID-19 positive or experiencing its symptoms.  ? ?Inpatient Visitation:   ? ?Visiting hours are 7 a.m. to 8 p.m. ?Up to four visitors are allowed at one time in a patient room, including children. The visitors may rotate out with other people during the day. One designated support person (adult) may remain overnight.  ?

## 2022-01-03 LAB — SURGICAL PCR SCREEN
MRSA, PCR: NEGATIVE
Staphylococcus aureus: POSITIVE — AB

## 2022-01-07 ENCOUNTER — Encounter: Payer: Self-pay | Admitting: Dermatology

## 2022-01-09 DIAGNOSIS — C61 Malignant neoplasm of prostate: Secondary | ICD-10-CM | POA: Diagnosis not present

## 2022-01-09 DIAGNOSIS — I10 Essential (primary) hypertension: Secondary | ICD-10-CM | POA: Diagnosis not present

## 2022-01-09 DIAGNOSIS — Z1389 Encounter for screening for other disorder: Secondary | ICD-10-CM | POA: Diagnosis not present

## 2022-01-09 DIAGNOSIS — Z0000 Encounter for general adult medical examination without abnormal findings: Secondary | ICD-10-CM | POA: Diagnosis not present

## 2022-01-09 DIAGNOSIS — M48062 Spinal stenosis, lumbar region with neurogenic claudication: Secondary | ICD-10-CM | POA: Diagnosis not present

## 2022-01-11 ENCOUNTER — Inpatient Hospital Stay: Payer: Medicare HMO

## 2022-01-13 ENCOUNTER — Ambulatory Visit: Payer: Medicare HMO

## 2022-01-13 ENCOUNTER — Ambulatory Visit: Payer: Medicare HMO | Admitting: Urgent Care

## 2022-01-13 ENCOUNTER — Observation Stay
Admission: RE | Admit: 2022-01-13 | Discharge: 2022-01-14 | Disposition: A | Discharge status: Home / Self Care | Payer: Medicare HMO | Source: Ambulatory Visit | Attending: Neurosurgery | Admitting: Neurosurgery

## 2022-01-13 ENCOUNTER — Other Ambulatory Visit: Payer: Self-pay

## 2022-01-13 ENCOUNTER — Encounter
Admission: RE | Disposition: A | Discharge status: Home / Self Care | Payer: Self-pay | Source: Ambulatory Visit | Attending: Neurosurgery

## 2022-01-13 ENCOUNTER — Encounter: Payer: Self-pay | Admitting: Neurosurgery

## 2022-01-13 DIAGNOSIS — Z87891 Personal history of nicotine dependence: Secondary | ICD-10-CM | POA: Diagnosis not present

## 2022-01-13 DIAGNOSIS — E039 Hypothyroidism, unspecified: Secondary | ICD-10-CM | POA: Insufficient documentation

## 2022-01-13 DIAGNOSIS — M48062 Spinal stenosis, lumbar region with neurogenic claudication: Secondary | ICD-10-CM | POA: Diagnosis not present

## 2022-01-13 DIAGNOSIS — Z981 Arthrodesis status: Secondary | ICD-10-CM | POA: Diagnosis not present

## 2022-01-13 DIAGNOSIS — M48061 Spinal stenosis, lumbar region without neurogenic claudication: Secondary | ICD-10-CM | POA: Diagnosis present

## 2022-01-13 HISTORY — PX: LUMBAR LAMINECTOMY/DECOMPRESSION MICRODISCECTOMY: SHX5026

## 2022-01-13 LAB — CBC
HCT: 36.7 % — ABNORMAL LOW (ref 39.0–52.0)
Hemoglobin: 12.5 g/dL — ABNORMAL LOW (ref 13.0–17.0)
MCH: 36.3 pg — ABNORMAL HIGH (ref 26.0–34.0)
MCHC: 34.1 g/dL (ref 30.0–36.0)
MCV: 106.7 fL — ABNORMAL HIGH (ref 80.0–100.0)
Platelets: 198 10*3/uL (ref 150–400)
RBC: 3.44 MIL/uL — ABNORMAL LOW (ref 4.22–5.81)
RDW: 12.8 % (ref 11.5–15.5)
WBC: 4.7 10*3/uL (ref 4.0–10.5)
nRBC: 0 % (ref 0.0–0.2)

## 2022-01-13 LAB — CREATININE, SERUM
Creatinine, Ser: 0.92 mg/dL (ref 0.61–1.24)
GFR, Estimated: >60 mL/min (ref >60)

## 2022-01-13 LAB — ABO/RH: ABO/RH(D): O POS

## 2022-01-13 SURGERY — LUMBAR LAMINECTOMY/DECOMPRESSION MICRODISCECTOMY 3 LEVELS
Anesthesia: General | Site: Spine Lumbar

## 2022-01-13 MED ORDER — METHOCARBAMOL 500 MG PO TABS: 500.0000 mg | ORAL_TABLET | Freq: Four times a day (QID) | ORAL | Status: DC | PRN

## 2022-01-13 MED ORDER — FENTANYL CITRATE (PF) 100 MCG/2ML IJ SOLN: 25.0000 ug | INTRAMUSCULAR | Status: DC | PRN

## 2022-01-13 MED ORDER — OYSTER SHELL CALCIUM/D3 500-5 MG-MCG PO TABS
2.0000 | ORAL_TABLET | Freq: Every day | ORAL | Status: DC
  Administered 2022-01-14: 2 via ORAL
  Filled 2022-01-13: qty 2

## 2022-01-13 MED ORDER — METHOCARBAMOL 1000 MG/10ML IJ SOLN
500.0000 mg | Freq: Four times a day (QID) | INTRAVENOUS | Status: DC | PRN
  Filled 2022-01-13: qty 5

## 2022-01-13 MED ORDER — MORPHINE SULFATE (PF) 2 MG/ML IV SOLN: 2.0000 mg | INTRAVENOUS | Status: DC | PRN

## 2022-01-13 MED ORDER — OXYCODONE HCL 5 MG PO TABS: 5.0000 mg | ORAL_TABLET | Freq: Once | ORAL | Status: DC | PRN

## 2022-01-13 MED ORDER — ENOXAPARIN SODIUM 40 MG/0.4ML IJ SOSY
40.0000 mg | PREFILLED_SYRINGE | INTRAMUSCULAR | Status: DC
  Administered 2022-01-14: 40 mg via SUBCUTANEOUS
  Filled 2022-01-13: qty 0.4

## 2022-01-13 MED ORDER — PHENYLEPHRINE HCL-NACL 20-0.9 MG/250ML-% IV SOLN
INTRAVENOUS | Status: DC | PRN
  Administered 2022-01-13: 25 ug/min via INTRAVENOUS

## 2022-01-13 MED ORDER — PROPOFOL 10 MG/ML IV BOLUS
INTRAVENOUS | Status: AC
  Filled 2022-01-13: qty 20

## 2022-01-13 MED ORDER — ACETAMINOPHEN 500 MG PO TABS
1000.0000 mg | ORAL_TABLET | Freq: Four times a day (QID) | ORAL | Status: DC
  Administered 2022-01-13 – 2022-01-14 (×3): 1000 mg via ORAL
  Filled 2022-01-13 (×4): qty 2

## 2022-01-13 MED ORDER — DOCUSATE SODIUM 100 MG PO CAPS
100.0000 mg | ORAL_CAPSULE | Freq: Two times a day (BID) | ORAL | Status: DC
  Administered 2022-01-13 – 2022-01-14 (×2): 100 mg via ORAL
  Filled 2022-01-13 (×2): qty 1

## 2022-01-13 MED ORDER — PNEUMOCOCCAL 20-VAL CONJ VACC 0.5 ML IM SUSY
0.5000 mL | PREFILLED_SYRINGE | INTRAMUSCULAR | Status: DC
  Filled 2022-01-13: qty 0.5

## 2022-01-13 MED ORDER — CHLORHEXIDINE GLUCONATE 0.12 % MT SOLN
15.0000 mL | Freq: Once | OROMUCOSAL | Status: AC
  Administered 2022-01-13: 15 mL via OROMUCOSAL

## 2022-01-13 MED ORDER — METHYLPREDNISOLONE ACETATE 40 MG/ML IJ SUSP
INTRAMUSCULAR | Status: AC
  Filled 2022-01-13: qty 1

## 2022-01-13 MED ORDER — ORAL CARE MOUTH RINSE: 15.0000 mL | Freq: Once | OROMUCOSAL | Status: AC

## 2022-01-13 MED ORDER — KETOROLAC TROMETHAMINE 15 MG/ML IJ SOLN
7.5000 mg | Freq: Four times a day (QID) | INTRAMUSCULAR | Status: DC
  Administered 2022-01-13 – 2022-01-14 (×3): 7.5 mg via INTRAVENOUS
  Filled 2022-01-13 (×3): qty 1

## 2022-01-13 MED ORDER — BUPIVACAINE-EPINEPHRINE (PF) 0.5% -1:200000 IJ SOLN
INTRAMUSCULAR | Status: AC
  Filled 2022-01-13: qty 30

## 2022-01-13 MED ORDER — POLYETHYLENE GLYCOL 3350 17 G PO PACK: 17.0000 g | PACK | Freq: Every day | ORAL | Status: DC | PRN

## 2022-01-13 MED ORDER — COLESEVELAM HCL 625 MG PO TABS
1875.0000 mg | ORAL_TABLET | Freq: Two times a day (BID) | ORAL | Status: DC
  Administered 2022-01-13 – 2022-01-14 (×2): 1875 mg via ORAL
  Filled 2022-01-13 (×2): qty 3

## 2022-01-13 MED ORDER — BUPIVACAINE LIPOSOME 1.3 % IJ SUSP
INTRAMUSCULAR | Status: AC
  Filled 2022-01-13: qty 20

## 2022-01-13 MED ORDER — SODIUM CHLORIDE 0.9 % IV SOLN: 250.0000 mL | INTRAVENOUS | Status: DC

## 2022-01-13 MED ORDER — SODIUM CHLORIDE 0.9% FLUSH
3.0000 mL | Freq: Two times a day (BID) | INTRAVENOUS | Status: DC
  Administered 2022-01-13 – 2022-01-14 (×2): 3 mL via INTRAVENOUS

## 2022-01-13 MED ORDER — FIBRIN SEALANT 2 ML SINGLE DOSE KIT
PACK | CUTANEOUS | Status: DC | PRN
  Administered 2022-01-13: 2 mL via TOPICAL

## 2022-01-13 MED ORDER — CEFAZOLIN SODIUM-DEXTROSE 2-4 GM/100ML-% IV SOLN
INTRAVENOUS | Status: AC
  Filled 2022-01-13: qty 100

## 2022-01-13 MED ORDER — ACETAMINOPHEN 10 MG/ML IV SOLN: 1000.0000 mg | Freq: Once | INTRAVENOUS | Status: DC | PRN

## 2022-01-13 MED ORDER — VITAMIN B-12 1000 MCG PO TABS
500.0000 ug | ORAL_TABLET | Freq: Every day | ORAL | Status: DC
  Administered 2022-01-14: 500 ug via ORAL
  Filled 2022-01-13: qty 1

## 2022-01-13 MED ORDER — MENTHOL 3 MG MT LOZG: 1.0000 | LOZENGE | OROMUCOSAL | Status: DC | PRN

## 2022-01-13 MED ORDER — GLUCOSAMINE HCL 1000 MG PO TABS
2000.0000 mg | ORAL_TABLET | Freq: Every day | ORAL | Status: DC
Start: 2022-01-13 — End: 2022-01-13

## 2022-01-13 MED ORDER — DEXAMETHASONE SODIUM PHOSPHATE 10 MG/ML IJ SOLN
INTRAMUSCULAR | Status: DC | PRN
Start: 2022-01-13 — End: 2022-01-13
  Administered 2022-01-13: 10 mg via INTRAVENOUS

## 2022-01-13 MED ORDER — FAMOTIDINE 20 MG PO TABS
ORAL_TABLET | ORAL | Status: AC
  Filled 2022-01-13: qty 1

## 2022-01-13 MED ORDER — PHENYLEPHRINE 80 MCG/ML (10ML) SYRINGE FOR IV PUSH (FOR BLOOD PRESSURE SUPPORT)
PREFILLED_SYRINGE | INTRAVENOUS | Status: DC | PRN
Start: 2022-01-13 — End: 2022-01-13
  Administered 2022-01-13 (×3): 80 ug via INTRAVENOUS

## 2022-01-13 MED ORDER — EPHEDRINE SULFATE (PRESSORS) 50 MG/ML IJ SOLN
INTRAMUSCULAR | Status: DC | PRN
Start: 2022-01-13 — End: 2022-01-13
  Administered 2022-01-13 (×2): 5 mg via INTRAVENOUS

## 2022-01-13 MED ORDER — ONDANSETRON HCL 4 MG/2ML IJ SOLN
INTRAMUSCULAR | Status: DC | PRN
  Administered 2022-01-13: 4 mg via INTRAVENOUS

## 2022-01-13 MED ORDER — CEFAZOLIN SODIUM-DEXTROSE 2-4 GM/100ML-% IV SOLN
2.0000 g | INTRAVENOUS | Status: AC
  Administered 2022-01-13: 2 g via INTRAVENOUS

## 2022-01-13 MED ORDER — FENTANYL CITRATE (PF) 100 MCG/2ML IJ SOLN
INTRAMUSCULAR | Status: DC | PRN
  Administered 2022-01-13: 25 ug via INTRAVENOUS
  Administered 2022-01-13 (×2): 50 ug via INTRAVENOUS
  Administered 2022-01-13: 25 ug via INTRAVENOUS

## 2022-01-13 MED ORDER — BUPIVACAINE-EPINEPHRINE (PF) 0.5% -1:200000 IJ SOLN
INTRAMUSCULAR | Status: DC | PRN
  Administered 2022-01-13: 7 mL

## 2022-01-13 MED ORDER — SURGIFLO WITH THROMBIN (HEMOSTATIC MATRIX KIT) OPTIME
TOPICAL | Status: DC | PRN
  Administered 2022-01-13: 1 via TOPICAL

## 2022-01-13 MED ORDER — OXYCODONE HCL 5 MG PO TABS: 5.0000 mg | ORAL_TABLET | ORAL | Status: DC | PRN

## 2022-01-13 MED ORDER — ACETAMINOPHEN 10 MG/ML IV SOLN
INTRAVENOUS | Status: DC | PRN
  Administered 2022-01-13: 1000 mg via INTRAVENOUS

## 2022-01-13 MED ORDER — FENTANYL CITRATE (PF) 100 MCG/2ML IJ SOLN
INTRAMUSCULAR | Status: AC
  Filled 2022-01-13: qty 2

## 2022-01-13 MED ORDER — PHENOL 1.4 % MT LIQD: 1.0000 | OROMUCOSAL | Status: DC | PRN

## 2022-01-13 MED ORDER — PROPOFOL 10 MG/ML IV BOLUS
INTRAVENOUS | Status: DC | PRN
Start: 2022-01-13 — End: 2022-01-13
  Administered 2022-01-13: 120 mg via INTRAVENOUS
  Administered 2022-01-13: 40 mg via INTRAVENOUS

## 2022-01-13 MED ORDER — ASCORBIC ACID 500 MG PO TABS
1000.0000 mg | ORAL_TABLET | Freq: Every day | ORAL | Status: DC
  Administered 2022-01-14: 1000 mg via ORAL
  Filled 2022-01-13: qty 2

## 2022-01-13 MED ORDER — SODIUM CHLORIDE 0.9 % IV SOLN: INTRAVENOUS | Status: DC

## 2022-01-13 MED ORDER — BUPIVACAINE HCL (PF) 0.5 % IJ SOLN
INTRAMUSCULAR | Status: AC
  Filled 2022-01-13: qty 30

## 2022-01-13 MED ORDER — METHYLPREDNISOLONE ACETATE 40 MG/ML IJ SUSP
INTRAMUSCULAR | Status: DC | PRN
  Administered 2022-01-13: 40 mg

## 2022-01-13 MED ORDER — GLYCOPYRROLATE 0.2 MG/ML IJ SOLN
INTRAMUSCULAR | Status: DC | PRN
  Administered 2022-01-13: .2 mg via INTRAVENOUS

## 2022-01-13 MED ORDER — LOPERAMIDE HCL 2 MG PO CAPS
4.0000 mg | ORAL_CAPSULE | Freq: Every day | ORAL | Status: DC
  Administered 2022-01-14: 4 mg via ORAL
  Filled 2022-01-13: qty 2

## 2022-01-13 MED ORDER — PHENYLEPHRINE HCL-NACL 20-0.9 MG/250ML-% IV SOLN
INTRAVENOUS | Status: AC
Start: 2022-01-13 — End: ?
  Filled 2022-01-13: qty 250

## 2022-01-13 MED ORDER — REMIFENTANIL HCL 1 MG IV SOLR
INTRAVENOUS | Status: DC | PRN
Start: 2022-01-13 — End: 2022-01-13
  Administered 2022-01-13: .05 ug/kg/min via INTRAVENOUS

## 2022-01-13 MED ORDER — ONDANSETRON HCL 4 MG PO TABS: 4.0000 mg | ORAL_TABLET | Freq: Four times a day (QID) | ORAL | Status: DC | PRN

## 2022-01-13 MED ORDER — SIMVASTATIN 20 MG PO TABS
20.0000 mg | ORAL_TABLET | ORAL | Status: DC
  Administered 2022-01-14: 20 mg via ORAL
  Filled 2022-01-13: qty 1

## 2022-01-13 MED ORDER — SENNA 8.6 MG PO TABS
1.0000 | ORAL_TABLET | Freq: Two times a day (BID) | ORAL | Status: DC
  Administered 2022-01-13 – 2022-01-14 (×2): 8.6 mg via ORAL
  Filled 2022-01-13 (×2): qty 1

## 2022-01-13 MED ORDER — REMIFENTANIL HCL 1 MG IV SOLR
INTRAVENOUS | Status: AC
  Filled 2022-01-13: qty 1000

## 2022-01-13 MED ORDER — FAMOTIDINE 20 MG PO TABS
20.0000 mg | ORAL_TABLET | Freq: Once | ORAL | Status: AC
  Administered 2022-01-13: 20 mg via ORAL

## 2022-01-13 MED ORDER — SODIUM CHLORIDE 0.9% FLUSH: 3.0000 mL | INTRAVENOUS | Status: DC | PRN

## 2022-01-13 MED ORDER — LEVOTHYROXINE SODIUM 75 MCG PO TABS
75.0000 ug | ORAL_TABLET | Freq: Every day | ORAL | Status: DC
  Filled 2022-01-13: qty 1

## 2022-01-13 MED ORDER — CHLORHEXIDINE GLUCONATE 0.12 % MT SOLN
OROMUCOSAL | Status: AC
  Filled 2022-01-13: qty 15

## 2022-01-13 MED ORDER — LACTATED RINGERS IV SOLN: INTRAVENOUS | Status: DC

## 2022-01-13 MED ORDER — ONDANSETRON HCL 4 MG/2ML IJ SOLN: 4.0000 mg | Freq: Once | INTRAMUSCULAR | Status: DC | PRN

## 2022-01-13 MED ORDER — ONDANSETRON HCL 4 MG/2ML IJ SOLN: 4.0000 mg | Freq: Four times a day (QID) | INTRAMUSCULAR | Status: DC | PRN

## 2022-01-13 MED ORDER — ACETAMINOPHEN 10 MG/ML IV SOLN
INTRAVENOUS | Status: AC
  Filled 2022-01-13: qty 100

## 2022-01-13 MED ORDER — PROPOFOL 1000 MG/100ML IV EMUL
INTRAVENOUS | Status: AC
  Filled 2022-01-13: qty 200

## 2022-01-13 MED ORDER — SODIUM CHLORIDE (PF) 0.9 % IJ SOLN
INTRAMUSCULAR | Status: DC | PRN
  Administered 2022-01-13: 60 mL via INTRAMUSCULAR

## 2022-01-13 MED ORDER — FLEET ENEMA 7-19 GM/118ML RE ENEM: 1.0000 | ENEMA | Freq: Once | RECTAL | Status: DC | PRN

## 2022-01-13 MED ORDER — SODIUM CHLORIDE FLUSH 0.9 % IV SOLN
INTRAVENOUS | Status: AC
  Filled 2022-01-13: qty 20

## 2022-01-13 MED ORDER — BISACODYL 10 MG RE SUPP
10.0000 mg | Freq: Every day | RECTAL | Status: DC | PRN
  Filled 2022-01-13: qty 1

## 2022-01-13 MED ORDER — VITAMIN A 2400 MCG (8000 UT) PO CAPS: 2400.0000 ug | ORAL_CAPSULE | Freq: Every day | ORAL | Status: DC

## 2022-01-13 MED ORDER — OXYCODONE HCL 5 MG PO TABS: 10.0000 mg | ORAL_TABLET | ORAL | Status: DC | PRN

## 2022-01-13 MED ORDER — 0.9 % SODIUM CHLORIDE (POUR BTL) OPTIME
TOPICAL | Status: DC | PRN
  Administered 2022-01-13: 500 mL

## 2022-01-13 MED ORDER — OXYCODONE HCL 5 MG/5ML PO SOLN: 5.0000 mg | Freq: Once | ORAL | Status: DC | PRN

## 2022-01-13 SURGICAL SUPPLY — 53 items
BUR NEURO DRILL SOFT 3.0X3.8M (BURR) ×2 IMPLANT
CHLORAPREP W/TINT 26 (MISCELLANEOUS) ×4 IMPLANT
CNTNR SPEC 2.5X3XGRAD LEK (MISCELLANEOUS) ×1
CONT SPEC 4OZ STER OR WHT (MISCELLANEOUS) ×1
CONTAINER SPEC 2.5X3XGRAD LEK (MISCELLANEOUS) ×1 IMPLANT
COUNTER NEEDLE 20/40 LG (NEEDLE) ×2 IMPLANT
CUP MEDICINE 2OZ PLAST GRAD ST (MISCELLANEOUS) ×4 IMPLANT
DERMABOND ADVANCED (GAUZE/BANDAGES/DRESSINGS) ×1
DERMABOND ADVANCED .7 DNX12 (GAUZE/BANDAGES/DRESSINGS) ×1 IMPLANT
DRAPE C ARM PK CFD 31 SPINE (DRAPES) ×2 IMPLANT
DRAPE LAPAROTOMY 100X77 ABD (DRAPES) ×2 IMPLANT
DRAPE MICROSCOPE SPINE 48X150 (DRAPES) ×2 IMPLANT
DRAPE SURG 17X11 SM STRL (DRAPES) ×2 IMPLANT
DRSG OPSITE POSTOP 4X6 (GAUZE/BANDAGES/DRESSINGS) ×1 IMPLANT
ELECT CAUTERY BLADE TIP 2.5 (TIP) ×2
ELECT EZSTD 165MM 6.5IN (MISCELLANEOUS) ×2
ELECTRODE CAUTERY BLDE TIP 2.5 (TIP) ×1 IMPLANT
ELECTRODE EZSTD 165MM 6.5IN (MISCELLANEOUS) ×1 IMPLANT
GLOVE BIOGEL PI IND STRL 6.5 (GLOVE) ×1 IMPLANT
GLOVE BIOGEL PI INDICATOR 6.5 (GLOVE) ×1
GLOVE SURG SYN 6.5 ES PF (GLOVE) ×2 IMPLANT
GLOVE SURG SYN 6.5 PF PI (GLOVE) ×1 IMPLANT
GLOVE SURG SYN 8.5  E (GLOVE) ×3
GLOVE SURG SYN 8.5 E (GLOVE) ×3 IMPLANT
GLOVE SURG SYN 8.5 PF PI (GLOVE) ×3 IMPLANT
GLOVE SURG UNDER POLY LF SZ8.5 (GLOVE) ×2 IMPLANT
GOWN SRG LRG LVL 4 IMPRV REINF (GOWNS) ×1 IMPLANT
GOWN SRG XL LVL 3 NONREINFORCE (GOWNS) ×1 IMPLANT
GOWN STRL NON-REIN TWL XL LVL3 (GOWNS) ×1
GOWN STRL REIN LRG LVL4 (GOWNS) ×1
GOWN STRL REUS W/ TWL XL LVL3 (GOWN DISPOSABLE) ×1 IMPLANT
GOWN STRL REUS W/TWL XL LVL3 (GOWN DISPOSABLE) ×1
GRADUATE 1200CC STRL 31836 (MISCELLANEOUS) ×2 IMPLANT
GRAFT DURAGEN MATRIX 1WX1L (Tissue) ×1 IMPLANT
KIT SPINAL PRONEVIEW (KITS) ×2 IMPLANT
MANIFOLD NEPTUNE II (INSTRUMENTS) ×2 IMPLANT
MARKER SKIN DUAL TIP RULER LAB (MISCELLANEOUS) ×2 IMPLANT
NDL SAFETY ECLIPSE 18X1.5 (NEEDLE) IMPLANT
NEEDLE HYPO 18GX1.5 SHARP (NEEDLE) ×2
NEEDLE HYPO 22GX1.5 SAFETY (NEEDLE) ×2 IMPLANT
PACK LAMINECTOMY NEURO (CUSTOM PROCEDURE TRAY) ×2 IMPLANT
PAD ARMBOARD 7.5X6 YLW CONV (MISCELLANEOUS) ×2 IMPLANT
PENCIL ELECTRO HAND CTR (MISCELLANEOUS) ×1 IMPLANT
SURGIFLO W/THROMBIN 8M KIT (HEMOSTASIS) ×2 IMPLANT
SUT DVC VLOC 3-0 CL 6 P-12 (SUTURE) ×2 IMPLANT
SUT VIC AB 0 CT1 27 (SUTURE) ×2
SUT VIC AB 0 CT1 27XCR 8 STRN (SUTURE) ×1 IMPLANT
SUT VIC AB 2-0 CT1 18 (SUTURE) ×2 IMPLANT
SYR 20ML LL LF (SYRINGE) ×2 IMPLANT
SYR 30ML LL (SYRINGE) ×4 IMPLANT
SYR 3ML LL SCALE MARK (SYRINGE) ×3 IMPLANT
TOWEL OR 17X26 4PK STRL BLUE (TOWEL DISPOSABLE) ×6 IMPLANT
TUBING CONNECTING 10 (TUBING) ×2 IMPLANT

## 2022-01-13 NOTE — Discharge Instructions (Addendum)
?  Your surgeon has performed an operation on your lumbar spine (low back) to relieve pressure on one or more nerves. Many times, patients feel better immediately after surgery and can ?overdo it.? Even if you feel well, it is important that you follow these activity guidelines. If you do not let your back heal properly from the surgery, you can increase the chance of a disc herniation and/or return of your symptoms. The following are instructions to help in your recovery once you have been discharged from the hospital. ? ?Activity  ?  ?No bending, lifting, or twisting (?BLT?). Avoid lifting objects heavier than 10 pounds (gallon milk jug).  Where possible, avoid household activities that involve lifting, bending, pushing, or pulling such as laundry, vacuuming, grocery shopping, and childcare. Try to arrange for help from friends and family for these activities while your back heals. ? ?Increase physical activity slowly as tolerated.  Taking short walks is encouraged, but avoid strenuous exercise. Do not jog, run, bicycle, lift weights, or participate in any other exercises unless specifically allowed by your doctor. Avoid prolonged sitting, including car rides. ? ?Talk to your doctor before resuming sexual activity. ? ?You should not drive until cleared by your doctor. ? ?Until released by your doctor, you should not return to work or school.  You should rest at home and let your body heal.  ? ?You may shower two days after your surgery.  After showering, lightly dab your incision dry. Do not take a tub bath or go swimming for 3 weeks, or until approved by your doctor at your follow-up appointment. ? ?If you smoke, we strongly recommend that you quit.  Smoking has been proven to interfere with normal healing in your back and will dramatically reduce the success rate of your surgery. Please contact [facility] (800-QUIT-NOW) and use the resources at www.[facility].com for assistance in stopping smoking. ? ?Surgical  Incision ?  ?If you have a dressing on your incision, you may remove it three days after your surgery. Keep your incision area clean and dry. ? ?You have stitches on your incision, you should have a follow up scheduled for removal.  ?Diet          ? ? You may return to your usual diet. Be sure to stay hydrated. ? ?When to Contact us ? ?Although your surgery and recovery will likely be uneventful, you may have some residual numbness, aches, and pains in your back and/or legs. This is normal and should improve in the next few weeks. ? ?However, should you experience any of the following, contact us immediately: ?New numbness or weakness ?Pain that is progressively getting worse, and is not relieved by your pain medications or rest ?Bleeding, redness, swelling, pain, or drainage from surgical incision ?Chills or flu-like symptoms ?Fever greater than 101.0 F (38.3 C) ?Problems with bowel or bladder functions ?Difficulty breathing or shortness of breath ?Warmth, tenderness, or swelling in your calf ? ?Contact Information ?During office hours (Monday-Friday 9 am to 5 pm), please call your physician at [phone] ?After hours and weekends, please call [phone] and speak with the answering service, who will contact the doctor on call.  If that fails, call the [facility] at [phone] and ask for the Neurosurgery Resident On Call  ?For a life-threatening emergency, call 911  ?

## 2022-01-13 NOTE — Plan of Care (Signed)
Pt admitted to unit. No signs or symptoms of distress noted. Assessed per documentation.  ?

## 2022-01-13 NOTE — Anesthesia Preprocedure Evaluation (Signed)
Anesthesia Evaluation  ?Patient identified by MRN, date of birth, ID band ?Patient awake ? ? ? ?Reviewed: ?Allergy & Precautions, NPO status , Patient's Chart, lab work & pertinent test results ? ?History of Anesthesia Complications ?Negative for: history of anesthetic complications ? ?Airway ?Mallampati: II ? ?TM Distance: >3 FB ?Neck ROM: Full ? ? ? Dental ?no notable dental hx. ?(+) Teeth Intact ?  ?Pulmonary ?neg pulmonary ROS, neg sleep apnea, neg COPD, Patient abstained from smoking.Not current smoker, former smoker,  ?  ?Pulmonary exam normal ?breath sounds clear to auscultation ? ? ? ? ? ? Cardiovascular ?Exercise Tolerance: Good ?METS(-) hypertension(-) CAD and (-) Past MI negative cardio ROS ? ?(-) dysrhythmias  ?Rhythm:Regular Rate:Normal ?- Systolic murmurs ? ?  ?Neuro/Psych ?negative neurological ROS ? negative psych ROS  ? GI/Hepatic ?neg GERD  ,(+)  ?  ? (-) substance abuse ? ,   ?Endo/Other  ?neg diabetesHypothyroidism  ? Renal/GU ?negative Renal ROS  ? ?  ?Musculoskeletal ? ?(+) Arthritis ,  ? Abdominal ?  ?Peds ? Hematology ?  ?Anesthesia Other Findings ?Past Medical History: ?01/31/2016: Actinic keratosis ?    Comment:  Left lateral neck post auricular. AK and prurigo  ?             nodularis ?01/31/2016: Actinic keratosis ?    Comment:  Left lateral neck post auricular, inferior. AK and  ?             prurigo nodularis ?12/19/2017: Actinic keratosis ?    Comment:  Left ear anti-helix ?02/24/2014: Ankle arthropathy ?10/03/2012: Arthritis of foot, degenerative ?    Comment:  Overview:  Subtalar arthritis  ?04/30/2008: Basal cell carcinoma ?    Comment:  Left mid dorsum nose. ?07/12/2009: Basal cell carcinoma ?    Comment:  Left post. deltoid sup. lat. edge of cancer scar.  ?No date: Cancer [facility]) ?    Comment:  PROSTATE ?No date: Elevated PSA ?11/03/2015: Hereditary hemochromatosis ([facility]) ?    Comment:  Overview:  Treated by regular blood donation  ?No date: History of  kidney stones ?11/03/2015: HLD (hyperlipidemia) ?No date: Hyperlipidemia ?No date: Inguinal hernia ?02/24/2014: Localized, primary osteoarthritis of ankle or foot ?No date: Osteoarthritis ?05/17/2010: Squamous cell carcinoma of skin ?    Comment:  Left forearm. KA-like pattern. EDC. ?12/18/2016: Squamous cell carcinoma of skin ?    Comment:  Right mid lat. volar forearm.WD SCC. EDC ?01/22/2019: Squamous cell carcinoma of skin ?    Comment:  Right lateral distal tricep near elbow.  ?07/14/2019: Squamous cell carcinoma of skin ?    Comment:  Right forearm. WD SCC. EDC ?09/29/2019: Squamous cell carcinoma of skin ?    Comment:  Right proximal mandible. SCCis ?08/17/2020: Squamous cell carcinoma of skin ?    Comment:  Left ear sup helix ? Reproductive/Obstetrics ? ?  ? ? ? ? ? ? ? ? ? ? ? ? ? ?  ?  ? ? ? ? ? ? ? ? ?Anesthesia Physical ?Anesthesia Plan ? ?ASA: 2 ? ?Anesthesia Plan: General  ? ?Post-op Pain Management: Ofirmev IV (intra-op)*  ? ?Induction: Intravenous ? ?PONV Risk Score and Plan: 3 and Ondansetron, Dexamethasone and Treatment may vary due to age or medical condition ? ?Airway Management Planned: Oral ETT ? ?Additional Equipment: None ? ?Intra-op Plan:  ? ?Post-operative Plan: Extubation in OR ? ?Informed Consent: I have reviewed the patients History and Physical, chart, labs and discussed the procedure including the risks, benefits and alternatives  for the proposed anesthesia with the patient or authorized representative who has indicated his/her understanding and acceptance.  ? ? ? ?Dental advisory given ? ?Plan Discussed with: CRNA and Surgeon ? ?Anesthesia Plan Comments: (Discussed risks of anesthesia with patient and his wife at bedside, including PONV, sore throat, lip/dental/eye damage, post operative cognitive dysfunction. Rare risks discussed as well, such as cardiorespiratory and neurological sequelae, and allergic reactions. Discussed the role of CRNA in patient's perioperative care. Patient  understands.)  ? ? ? ? ? ? ?Anesthesia Quick Evaluation ? ?

## 2022-01-13 NOTE — Anesthesia Postprocedure Evaluation (Signed)
Anesthesia Post Note ? ?Patient: PROMISE BUSHONG ? ?Procedure(s) Performed: L3-S1 POSTERIOR SPIAL DECOMPRESSION (Spine Lumbar) ? ?Patient location during evaluation: PACU ?Anesthesia Type: General ?Level of consciousness: awake and alert ?Pain management: pain level controlled ?Vital Signs Assessment: post-procedure vital signs reviewed and stable ?Respiratory status: spontaneous breathing, nonlabored ventilation and respiratory function stable ?Cardiovascular status: blood pressure returned to baseline and stable ?Postop Assessment: no apparent nausea or vomiting ?Anesthetic complications: no ? ? ?No notable events documented. ? ? ?Last Vitals:  ?Vitals:  ? 01/13/22 1045 01/13/22 1101  ?BP: 117/77 137/75  ?Pulse: (!) 59 (!) 59  ?Resp: 20 18  ?Temp: (!) 36.1 ?C (!) 36.3 ?C  ?SpO2: 96% 95%  ?  ?Last Pain:  ?Vitals:  ? 01/13/22 1112  ?TempSrc:   ?PainSc: 0-No pain  ? ? ?  ?  ?  ?  ?  ?  ? ?[place] Ouch ? ? ? ? ?

## 2022-01-13 NOTE — Op Note (Signed)
Indications: Andrew Walsh is a 81 yo male who presented with neurogenic claudication. ? ?Findings: severe stenosis ? ?Preoperative Diagnosis: Lumbar Stenosis with neurogenic claudication ?Postoperative Diagnosis: same ? ? ?EBL: 25 ml ?IVF: see AR ml ?Drains: none ?Disposition: Extubated and Stable to PACU ?Complications: none ? ?No foley catheter was placed. ? ? ?Preoperative Note:  ? ?Risks of surgery discussed include: infection, bleeding, stroke, coma, death, paralysis, CSF leak, nerve/spinal cord injury, numbness, tingling, weakness, complex regional pain syndrome, recurrent stenosis and/or disc herniation, vascular injury, development of instability, neck/back pain, need for further surgery, persistent symptoms, development of deformity, and the risks of anesthesia. The patient understood these risks and agreed to proceed. ? ?Operative Note:  ? ?1. L3-S1 lumbar decompression including central laminectomy and bilateral medial facetectomies including foraminotomies ? ?The patient was then brought from the preoperative center with intravenous access established.  The patient underwent general anesthesia and endotracheal tube intubation, and was then rotated on the [place] rail top where all pressure points were appropriately padded.  The skin was then thoroughly cleansed.  Perioperative antibiotic prophylaxis was administered.  Sterile prep and drapes were then applied and a timeout was then observed.  C-arm was brought into the field under sterile conditions and under lateral visualization the L5-S1 interspace was identified and marked. ? ?The incision was marked on the right and injected with local anesthetic. Once this was complete a 6 cm incision was opened with the use of a #10 blade knife.   ? ?The metrx tubes were sequentially advanced and confirmed in position at L5-S1. An 61m by 567mtube was locked in place to the bed side attachment.  The microscope was then sterilely brought into the field and muscle  creep was hemostased with a bipolar and resected with a pituitary rongeur.  A Bovie extender was then used to expose the spinous process and lamina.  Careful attention was placed to not violate the facet capsule. A 3 mm matchstick drill bit was then used to make a hemi-laminotomy trough until the ligamentum flavum was exposed.  This was extended to the base of the spinous process and to the contralateral side to remove all the central bone from each side.  Once this was complete and the underlying ligamentum flavum was visualized, it was dissected with a curette and resected with Kerrison rongeurs.  Extensive ligamentum hypertrophy was noted, requiring a substantial amount of time and care for removal.  The dura was identified and palpated. The kerrison rongeur was then used to remove the medial facet bilaterally until no compression was noted.  A balltip probe was used to confirm decompression of the ipsilateral S1 nerve root. ? ?Additional attention was paid to completion of the contralateral L5-S1 foraminotomy until the contralateral traversing nerve root was completely free.  Once this was complete, L5-S1 central decompression including medial facetectomy and foraminotomy was confirmed and decompression on both sides was confirmed. No CSF leak was noted. ? ?A Depo-Medrol soaked Gelfoam pledget was placed in the defect.  The wound was copiously irrigated. The tube system was then removed under microscopic visualization and hemostasis was obtained with a bipolar.   ? ?After performing the decompression at L5-S1, the metrx tubes were sequentially advanced and confirmed in position at L4-5. An 1824my 31m11mbe was locked in place to the bed side attachment.  Fluoroscopy was then removed from the field.  The microscope was then sterilely brought into the field and muscle creep was hemostased with a bipolar and resected  with a pituitary rongeur.  A Bovie extender was then used to expose the spinous process and  lamina.  Careful attention was placed to not violate the facet capsule. A 3 mm matchstick drill bit was then used to make a hemi-laminotomy trough until the ligamentum flavum was exposed.  This was extended to the base of the spinous process and to the contralateral side to remove all the central bone from each side.  Once this was complete and the underlying ligamentum flavum was visualized, it was dissected with a curette and resected with Kerrison rongeurs.  Extensive ligamentum hypertrophy was noted, requiring a substantial amount of time and care for removal.  The dura was identified and palpated. The kerrison rongeur was then used to remove the medial facet bilaterally until no compression was noted.  A balltip probe was used to confirm decompression of the ipsilateral L5 nerve root. ? ?Additional attention was paid to completion of the contralateral foraminotomy until the contralateral L5 nerve root was completely free.  Once this was complete, L4-5 central decompression including medial facetectomy and foraminotomy was confirmed and decompression on both sides was confirmed. A low flow CSF leak was noted. Duragen and Tisseal were used to seal the leak. ? ?The wound was copiously irrigated. The tube system was then removed under microscopic visualization and hemostasis was obtained with a bipolar.   ? ?After performing the decompression at L4-5, the metrx tubes were sequentially advanced and confirmed in position at L3-4. An 63m by 568mtube was locked in place to the bed side attachment.  Fluoroscopy was then removed from the field.  The microscope was then sterilely brought into the field and muscle creep was hemostased with a bipolar and resected with a pituitary rongeur.  A Bovie extender was then used to expose the spinous process and lamina.  Careful attention was placed to not violate the facet capsule. A 3 mm matchstick drill bit was then used to make a hemi-laminotomy trough until the ligamentum  flavum was exposed.  This was extended to the base of the spinous process and to the contralateral side to remove all the central bone from each side.  Once this was complete and the underlying ligamentum flavum was visualized, it was dissected with a curette and resected with Kerrison rongeurs.  Extensive ligamentum hypertrophy was noted, requiring a substantial amount of time and care for removal.  The dura was identified and palpated. The kerrison rongeur was then used to remove the medial facet bilaterally until no compression was noted.  A balltip probe was used to confirm decompression of the ipsilateral L4 nerve root. ? ?Additional attention was paid to completion of the contralateral foraminotomy until the contralateral L4 nerve root was completely free.  Once this was complete, L3-4 central decompression including medial facetectomy and foraminotomy was confirmed and decompression on both sides was confirmed. A low flow CSF leak was noted. Duragen and Tisseal were used to seal the leak. ? ?The wound was copiously irrigated. The tube system was then removed under microscopic visualization and hemostasis was obtained with a bipolar.   ? ? ?The fascial layer was reapproximated with the use of a 0 Vicryl suture.  Subcutaneous tissue layer was reapproximated using 2-0 Vicryl suture.  3-0 nylon was used to close the skin.The skin was then cleansed and Dermabond was used to close the skin opening.  Patient was then rotated back to the preoperative bed awakened from anesthesia and taken to recovery all counts are correct in  this case. ? ?I performed the entire procedure with the assistance of Cooper Render PA as an Pensions consultant. ? ?Fransisco Messmer K. Izora Ribas MD ? ?

## 2022-01-13 NOTE — Progress Notes (Addendum)
PHARMACY -  BRIEF ANTIBIOTIC NOTE  ? ?Pharmacy has received consult(s) for Cefazolin from an OR provider.  The patient's profile has been reviewed for ht/wt/allergies/indication/available labs.   ? ?One time order(s) placed for Cefazolin 2 gm per pt wt 74.8 kg ? ?Further antibiotics/pharmacy consults should be ordered by admitting physician if indicated.       ?                ?Thank you, ?Renda Rolls, PharmD, MBA ?01/13/2022 ?6:36 AM ? ?

## 2022-01-13 NOTE — Anesthesia Procedure Notes (Signed)
Procedure Name: Intubation ?Date/Time: 01/13/2022 7:22 AM ?Performed by: Lerry Liner, CRNA ?Pre-anesthesia Checklist: Patient identified, Emergency Drugs available, Suction available and Patient being monitored ?Patient Re-evaluated:Patient Re-evaluated prior to induction ?Oxygen Delivery Method: Circle system utilized ?Preoxygenation: Pre-oxygenation with 100% oxygen ?Induction Type: IV induction ?Ventilation: Mask ventilation without difficulty ?Laryngoscope Size: McGraph and 4 ?Grade View: Grade I ?Tube type: Oral ?Tube size: 7.5 mm ?Number of attempts: 1 ?Airway Equipment and Method: Stylet and Oral airway ?Placement Confirmation: ETT inserted through vocal cords under direct vision, positive ETCO2 and breath sounds checked- equal and bilateral ?Secured at: 23 cm ?Tube secured with: Tape ?Dental Injury: Teeth and Oropharynx as per pre-operative assessment  ? ? ? ? ?

## 2022-01-13 NOTE — Transfer of Care (Signed)
Immediate Anesthesia Transfer of Care Note ? ?Patient: Andrew Walsh ? ?Procedure(s) Performed: L3-S1 POSTERIOR SPIAL DECOMPRESSION (Spine Lumbar) ? ?Patient Location: PACU ? ?Anesthesia Type:General ? ?Level of Consciousness: drowsy ? ?Airway & Oxygen Therapy: Patient Spontanous Breathing and Patient connected to face mask oxygen ? ?Post-op Assessment: Report given to RN ? ?Post vital signs: stable ? ?Last Vitals:  ?Vitals Value Taken Time  ?BP    ?Temp    ?Pulse 70 01/13/22 1006  ?Resp    ?SpO2 100 % 01/13/22 1006  ?Vitals shown include unvalidated device data. ? ?Last Pain:  ?Vitals:  ? 01/13/22 0631  ?TempSrc: Temporal  ?PainSc: 0-No pain  ?   ? ?  ? ?Complications: No notable events documented. ?

## 2022-01-13 NOTE — H&P (Signed)
I have reviewed and confirmed my history and physical from 12/22/2021 with no additions or changes. Plan for L3-S1 PSD.  Risks and benefits reviewed. ? ?Heart sounds normal no MRG. Chest Clear to Auscultation Bilaterally. ? ? ?  ? ?

## 2022-01-13 NOTE — Progress Notes (Signed)
PHARMACIST - PHYSICIAN ORDER COMMUNICATION ? ?CONCERNING: P&T Medication Policy on Herbal Medications ? ?DESCRIPTION:  This patient?s order for:  GLUCOSAMINE HCL 1000 MG PO TABS and Vitamin A  has been noted. ? ?This product(s) is classified as an ?herbal? or natural product. ?Due to a lack of definitive safety studies or FDA approval, nonstandard manufacturing practices, plus the potential risk of unknown drug-drug interactions while on inpatient medications, the Pharmacy and Therapeutics Committee does not permit the use of ?herbal? or natural products of this type within [facility]. ?  ?ACTION TAKEN: ?The pharmacy department is unable to verify this order at this time. ? ?Please reevaluate patient?s clinical condition at discharge and address if the herbal or natural product(s) should be resumed at that time. ? ?Pernell Dupre, PharmD, BCPS ?Clinical Pharmacist ?01/13/2022 ?11:11 AM ? ?

## 2022-01-14 DIAGNOSIS — Z87891 Personal history of nicotine dependence: Secondary | ICD-10-CM | POA: Diagnosis not present

## 2022-01-14 DIAGNOSIS — E039 Hypothyroidism, unspecified: Secondary | ICD-10-CM | POA: Diagnosis not present

## 2022-01-14 DIAGNOSIS — M48062 Spinal stenosis, lumbar region with neurogenic claudication: Secondary | ICD-10-CM | POA: Diagnosis not present

## 2022-01-14 MED ORDER — SENNA 8.6 MG PO TABS: 1.0000 | ORAL_TABLET | Freq: Two times a day (BID) | ORAL | 0 refills | Status: DC | PRN

## 2022-01-14 MED ORDER — OXYCODONE HCL 5 MG PO TABS
5.0000 mg | ORAL_TABLET | ORAL | 0 refills | Status: DC | PRN
Start: 2022-01-14 — End: 2022-01-20

## 2022-01-14 MED ORDER — METHOCARBAMOL 500 MG PO TABS: 500.0000 mg | ORAL_TABLET | Freq: Four times a day (QID) | ORAL | 0 refills | Status: DC | PRN

## 2022-01-14 NOTE — Evaluation (Signed)
Occupational Therapy Evaluation ?Patient Details ?Name: Andrew Walsh ?MRN: [id number] ?DOB: 14-Apr-1941 ?Today's Date: 01/14/2022 ? ? ?History of Present Illness Patient is an 81 year old male s/p L3-S1 posterior lumbar spinal decompression.  ? ?Clinical Impression ?  ?Pt seen for OT evaluation this date, POD#1 from above surgery. Prior to hospital admission, pt was independent with mobility, ADL, and IADL. Pt lives with spouse in an apartment with approx 18 steps to enter, rails on L going up with wall on right per pt report; Spouse able to provide 24/7 assist/support as needed for pt. Pt currently performs ADL with CGA-supervision for vcs for technique with all aspects of mobility and self care tasks. Pt educated in precautions, self care skills, AE, and home/routines modifications to maximize safety and functional independence while minimizing falls risk and maintaining precautions. Pt verbalized understanding of all education/training provided. Able to return demonstration safe techniques. OT will follow while admitted, no OT follow up recommended at this time. Pt is left in bedside chair, NAD, all needs met.  ?   ? ?Recommendations for follow up therapy are one component of a multi-disciplinary discharge planning process, led by the attending physician.  Recommendations may be updated based on patient status, additional functional criteria and insurance authorization.  ? ?Follow Up Recommendations ? No OT follow up  ?  ?Assistance Recommended at Discharge Intermittent Supervision/Assistance  ?Patient can return home with the following Assistance with cooking/housework;Assist for transportation;Help with stairs or ramp for entrance;A little help with bathing/dressing/bathroom ? ?  ?Functional Status Assessment ? Patient has had a recent decline in their functional status and demonstrates the ability to make significant improvements in function in a reasonable and predictable amount of time.  ?Equipment  Recommendations ? None recommended by OT  ?  ?Recommendations for Other Services   ? ? ?  ?Precautions / Restrictions Precautions ?Precautions: Fall ?Restrictions ?Weight Bearing Restrictions: No  ? ?  ? ?Mobility Bed Mobility ?  ?  ?  ?  ?  ?  ?  ?General bed mobility comments: NT pt in recliner pre and post evaluation ?  ? ?Transfers ?Overall transfer level: Needs assistance ?Equipment used: Rolling walker (2 wheels) ?Transfers: Sit to/from Stand ?Sit to Stand: Supervision ?  ?  ?  ?  ?  ?General transfer comment: vcs for hand placement, fair-good carry over noted, will contiue to benefit from ongoing education ?  ? ?  ?Balance Overall balance assessment: Needs assistance ?Sitting-balance support: Bilateral upper extremity supported, Feet supported ?Sitting balance-Leahy Scale: Good ?  ?  ?Standing balance support: Bilateral upper extremity supported, During functional activity, Reliant on assistive device for balance ?Standing balance-Leahy Scale: Fair ?  ?  ?  ?  ?  ?  ?  ?  ?  ?  ?  ?  ?   ? ?ADL either performed or assessed with clinical judgement  ? ?ADL Overall ADL's : Needs assistance/impaired ?Eating/Feeding: Set up;Sitting ?  ?Grooming: Supervision/safety;Wash/dry hands;Wash/dry face;Oral care;Standing ?Grooming Details (indicate cue type and reason): at sink with RW, vcs for RW use ?  ?  ?  ?  ?Upper Body Dressing : Set up;Sitting ?  ?Lower Body Dressing: Set up;Sit to/from stand;Min guard;Supervision/safety ?Lower Body Dressing Details (indicate cue type and reason): vcs for energy conservation, RW  use ?Toilet Transfer: Supervision/safety;Min guard;BSC/3in1;Ambulation ?  ?Toileting- Clothing Manipulation and Hygiene: Supervision/safety;Min guard;Sit to/from stand ?  ?  ?  ?Functional mobility during ADLs: Supervision/safety;Rolling walker (2 wheels);Cueing for safety ?   ? ? ? ?  Vision Patient Visual Report: No change from baseline ?   ?   ?Perception   ?  ?Praxis   ?  ? ?Pertinent Vitals/Pain Pain  Assessment ?Pain Assessment: No/denies pain  ? ? ? ?Hand Dominance Right ?  ?Extremity/Trunk Assessment Upper Extremity Assessment ?Upper Extremity Assessment: Overall WFL for tasks assessed ?  ?Lower Extremity Assessment ?Lower Extremity Assessment: Defer to PT evaluation ?RLE Deficits / Details: R ankle fusion ?  ?Cervical / Trunk Assessment ?Cervical / Trunk Assessment: Normal ?  ?Communication Communication ?Communication: No difficulties ?  ?Cognition Arousal/Alertness: Awake/alert ?Behavior During Therapy: [facility] for tasks assessed/performed ?Overall Cognitive Status: Within Functional Limits for tasks assessed ?  ?  ?  ?  ?  ?  ?  ?  ?  ?  ?  ?  ?  ?  ?  ?  ?  ?  ?  ?General Comments    ? ?  ?Exercises Other Exercises ?Other Exercises: edu re: role of OT, role of rehab, discharge recommendations, falls prevention, DME/AE use, precautions /body mechanics, energy conservation as needed, home safety ?  ?Shoulder Instructions    ? ? ?Home Living Family/patient expects to be discharged to:: Private residence ?Living Arrangements: Spouse/significant other ?Available Help at Discharge: Family;Available 24 hours/day ?Type of Home: Apartment ?Home Access: Stairs to enter ?Entrance Stairs-Number of Steps: 18 ?Entrance Stairs-Rails: Left (wall on right) ?Home Layout: One level ?  ?  ?Bathroom Shower/Tub: Walk-in shower ?  ?Bathroom Toilet: Standard ?  ?  ?Home Equipment: Conservation officer, nature (2 wheels);Cane - single point;Crutches;Shower seat;Grab bars - toilet;Grab bars - tub/shower ?  ?  ?  ? ?  ?Prior Functioning/Environment Prior Level of Function : Independent/Modified Independent ?  ?  ?  ?  ?  ?  ?Mobility Comments: amb with no AD home and community distances ?ADLs Comments: MOD I-I in ADL ?  ? ?  ?  ?OT Problem List: Decreased activity tolerance;Decreased knowledge of use of DME or AE ?  ?   ?OT Treatment/Interventions: Self-care/ADL training;Patient/family education;DME and/or AE instruction  ?  ?OT Goals(Current goals  can be found in the care plan section) Acute Rehab OT Goals ?Patient Stated Goal: go home ?OT Goal Formulation: With patient ?Time For Goal Achievement: 01/28/22 ?Potential to Achieve Goals: Good ?ADL Goals ?Pt Will Perform Grooming: with modified independence ?Pt Will Perform Lower Body Dressing: with modified independence ?Pt Will Transfer to Toilet: with modified independence ?Pt Will Perform Toileting - Clothing Manipulation and hygiene: with modified independence  ?OT Frequency: Min 2X/week ?  ? ?Co-evaluation   ?  ?  ?  ?  ? ?  ?AM-PAC OT "6 Clicks" Daily Activity     ?Outcome Measure Help from another person eating meals?: None ?Help from another person taking care of personal grooming?: None ?Help from another person toileting, which includes using toliet, bedpan, or urinal?: None ?Help from another person bathing (including washing, rinsing, drying)?: None ?Help from another person to put on and taking off regular upper body clothing?: None ?Help from another person to put on and taking off regular lower body clothing?: None ?6 Click Score: 24 ?  ?End of Session Equipment Utilized During Treatment: Rolling walker (2 wheels);Gait belt ?Nurse Communication: Mobility status ? ?Activity Tolerance: Patient tolerated treatment well ?Patient left: in chair;with call bell/phone within reach;with chair alarm set ? ?OT Visit Diagnosis: Unsteadiness on feet (R26.81)  ?              ?Time: 8338-2505 ?  OT Time Calculation (min): 30 min ?Charges:  OT General Charges ?$OT Visit: 1 Visit ?OT Evaluation ?$OT Eval Low Complexity: 1 Low ?OT Treatments ?$Self Care/Home Management : 8-22 mins ?Shanon Payor, OTD OTR/L  ?01/14/22, 10:46 AM  ?

## 2022-01-14 NOTE — Discharge Summary (Signed)
Physician Discharge Summary  ?Patient ID: ?Andrew Walsh ?MRN: [id number] ?DOB/AGE: 1941-05-19 81 y.o. ? ?Admit date: 01/13/2022 ?Discharge date: 01/14/2022 ? ?Admission Diagnoses: ? ?Discharge Diagnoses:  ?Principal Problem: ?  Lumbar stenosis ? ? ?Discharged Condition: good ? ?Hospital Course: Andrew Walsh underwent elective surgery for severe stenosis. He is doing well on POD1 and has met criteria for discharge. ? ?Consults: None ? ?Significant Diagnostic Studies: Xray showing appropriate localization ? ?Treatments: surgery: L3-S1 decompression ? ?Discharge Exam: ?Blood pressure (!) 158/79, pulse (!) 52, temperature 98.2 ?F (36.8 ?C), temperature source Oral, resp. rate 18, weight 74.8 kg, SpO2 97 %. ?General appearance: alert and cooperative ?CNI ?5/5 BLE ?SILT ? ?Disposition: Discharge disposition: 01-Home or Self Care ? ? ? ? ? ? ?Discharge Instructions   ? ? Discharge patient   Complete by: As directed ?  ? Discharge disposition: 01-Home or Self Care  ? Discharge patient date: 01/14/2022  ? Incentive spirometry RT   Complete by: As directed ?  ? ?  ? ?Allergies as of 01/14/2022   ?No Known Allergies ?  ? ?  ?Medication List  ?  ? ?TAKE these medications   ? ?Calcium 600+D3 Plus Minerals 600-800 MG-UNIT Tabs ?Take 2 tablets by mouth daily. ?  ?colesevelam 625 MG tablet ?Commonly known as: WELCHOL ?Take 1,875 mg by mouth 2 (two) times daily with a meal. ?  ?Glucosamine HCl 1000 MG Tabs ?Take 2,000 mg by mouth daily. ?  ?levothyroxine 75 MCG tablet ?Commonly known as: SYNTHROID ?Take 75 mcg by mouth daily before breakfast. ?  ?loperamide 2 MG capsule ?Commonly known as: IMODIUM ?Take 4 mg by mouth daily. ?  ?methocarbamol 500 MG tablet ?Commonly known as: ROBAXIN ?Take 1 tablet (500 mg total) by mouth every 6 (six) hours as needed for muscle spasms. ?  ?naproxen sodium 220 MG tablet ?Commonly known as: ALEVE ?Take 440-660 mg by mouth daily as needed (pain). ?  ?oxyCODONE 5 MG immediate release tablet ?Commonly known as:  Oxy IR/ROXICODONE ?Take 1 tablet (5 mg total) by mouth every 3 (three) hours as needed for moderate pain ((score 4 to 6)). ?  ?senna 8.6 MG Tabs tablet ?Commonly known as: SENOKOT ?Take 1 tablet (8.6 mg total) by mouth 2 (two) times daily as needed for mild constipation. ?  ?simvastatin 20 MG tablet ?Commonly known as: ZOCOR ?Take 20 mg by mouth every other day. ?  ?Vitamin A 2400 MCG (8000 UT) Caps ?Take 2,400 mcg by mouth daily. ?  ?vitamin B-12 500 MCG tablet ?Commonly known as: CYANOCOBALAMIN ?Take 500 mcg by mouth daily. ?  ?vitamin C 1000 MG tablet ?Take 1,000 mg by mouth daily. ?  ? ?  ? ? Follow-up Information   ? ? Loleta Dicker, PA Follow up in 2 week(s).   ?Why: For incision check and suture removal. This appointment date and time should be included on your pre-op paperwork. Please call the office with any questions or concerns regarding follow up appointment ?Contact information: ?[place] [address] ?[phone] ? ? ?  ?  ? ?  ?  ? ?  ? ? ?Signed: ?Gibson Telleria ?01/14/2022, 10:12 AM ? ? ?

## 2022-01-14 NOTE — Evaluation (Signed)
Physical Therapy Evaluation ?Patient Details ?Name: Andrew Walsh ?MRN: [id number] ?DOB: 1941/05/09 ?Today's Date: 01/14/2022 ? ?History of Present Illness ? Patient is an 82 year old male s/p L3-S1 posterior lumbar spinal decompression.  ?Clinical Impression ? Physical Therapy Evaluation completed this date. Patient tolerated session well and was agreeable to treatment. Upon entry into room patient was eating breakfast in bed with HOB elevated. No pain reported throughout session. Patient reports he lives in a first floor apartment with ~18 STE and 1 sided hand rail with his wife who is able to provide assistance if needed. Patient reports ambulating with a SPC at baseline, however does have a RW at home as well. Patient was Mod I with all ADLs and mobility prior to hospitalization. Patient BUE strength was WFL, and BLE strength at 4/5. Throughout session patient required continuous cueing to slow down. Patient was able to complete all bed mobility at [facility] with no physical assistance required. Sit to stand from EOB and plynth table in therapy gym required CGA/SBA with cueing on proper hand placement. Patient continuously attempted to pull to standing from RW. Ambulating around the nurse station, patient required CGA/Min A with RW due to increased unsteadiness from mild trendelenburg gait, NBOS, and R steppage gait from previous fused ankle. No LOB noted however. Patient completed 2 bouts of 4 steps through alternating gait pattern and BUE support from hand rails. Attempted to educate patient on step to pattern, however patient unconsciously reverted back to alternating pattern. Steps were completed at CGA-Min A. Patient would continue to benefit from skilled physical therapy in order to optimize patient's strength and return to PLOF. Patient would benefit from HHPT upon discharge from acute hospitalization.  ?   ? ?Recommendations for follow up therapy are one component of a multi-disciplinary discharge planning  process, led by the attending physician.  Recommendations may be updated based on patient status, additional functional criteria and insurance authorization. ? ?Follow Up Recommendations Home health PT ? ?  ?Assistance Recommended at Discharge PRN  ?Patient can return home with the following ? A little help with walking and/or transfers;Help with stairs or ramp for entrance ? ?  ?Equipment Recommendations None recommended by PT (patient has all required DME at home)  ?Recommendations for Other Services ?    ?  ?Functional Status Assessment Patient has had a recent decline in their functional status and demonstrates the ability to make significant improvements in function in a reasonable and predictable amount of time.  ? ?  ?Precautions / Restrictions Precautions ?Precautions: Fall ?Restrictions ?Weight Bearing Restrictions: No  ? ?  ? ?Mobility ? Bed Mobility ?Overal bed mobility: Needs Assistance ?Bed Mobility: Supine to Sit, Sit to Supine ?  ?  ?Supine to sit: Supervision ?Sit to supine: Supervision ?  ?General bed mobility comments: no physical assistance required, cueing to take patient's time ?  ? ?Transfers ?Overall transfer level: Needs assistance ?Equipment used: Rolling walker (2 wheels) ?Transfers: Sit to/from Stand ?Sit to Stand: Min guard (SBA) ?  ?  ?  ?  ?  ?General transfer comment: cueing for hand placement as patient repeatedly attempted to pull up to standing from RW, poor eccentric control into sitting ?  ? ?Ambulation/Gait ?Ambulation/Gait assistance: Min guard, Min assist ?Gait Distance (Feet): 220 Feet ?Assistive device: Rolling walker (2 wheels) ?Gait Pattern/deviations: Step-through pattern, Decreased step length - right, Decreased step length - left, Decreased stride length, Trendelenburg, Trunk flexed, Narrow base of support, Steppage ?Gait velocity: decreased ?  ?  ?  General Gait Details: Steppage gait on RLE due to fused ankle, slight R trendeleburg gait noted, cueing required for upright  posture, wider BOS, maintaining safe distance within the RW, increased unsteadiness noted however no LOB ? ?Stairs ?Stairs: Yes ?Stairs assistance: Min guard, Min assist ?Stair Management: One rail Right, One rail Left, Two rails, Alternating pattern, Step to pattern ?Number of Stairs: 8 ?General stair comments: attempted to cue on step to pattern for increased stability, however patient continued to complete steps with step to pattern. Per patient report there is only 1 hand rail on steps at home, however a wall is close enough on the opposite side for increased stability, heavy use of hand rails during stair trials during session, increased unsteadiness however no LOB noted, sig cueing on taking patient's time to complete steps ? ?Wheelchair Mobility ?  ? ?Modified Rankin (Stroke Patients Only) ?  ? ?  ? ?Balance Overall balance assessment: Needs assistance ?Sitting-balance support: Bilateral upper extremity supported, Feet supported ?Sitting balance-Leahy Scale: Good ?Sitting balance - Comments: can reach outside BOS ?  ?Standing balance support: Bilateral upper extremity supported, During functional activity, Reliant on assistive device for balance ?Standing balance-Leahy Scale: Fair ?Standing balance comment: mild unsteadiness, requires CGA to MinA for ambulation ?  ?  ?  ?  ?  ?  ?  ?  ?  ?  ?  ?   ? ? ? ?Pertinent Vitals/Pain Pain Assessment ?Pain Assessment: No/denies pain  ? ? ?Home Living Family/patient expects to be discharged to:: Private residence ?Living Arrangements: Spouse/significant other ?Available Help at Discharge: Family;Available 24 hours/day ?Type of Home: Apartment ?Home Access: Stairs to enter ?Entrance Stairs-Rails: Right;Left ?Entrance Stairs-Number of Steps: 18 ?  ?Home Layout: One level ?Home Equipment: Conservation officer, nature (2 wheels);Cane - single point;Crutches;Shower seat;Grab bars - toilet;Grab bars - tub/shower ?   ?  ?Prior Function Prior Level of Function : Independent/Modified  Independent ?  ?  ?  ?  ?  ?  ?Mobility Comments: Exercises ~3days/week ?  ?  ? ? ?Hand Dominance  ? Dominant Hand: Right ? ?  ?Extremity/Trunk Assessment  ? Upper Extremity Assessment ?Upper Extremity Assessment: Overall WFL for tasks assessed ?  ? ?Lower Extremity Assessment ?Lower Extremity Assessment: Generalized weakness;RLE deficits/detail (at least 4/5 strength) ?RLE Deficits / Details: decreased ankle ROM due to previous ankle fusion ?  ? ?   ?Communication  ? Communication: No difficulties  ?Cognition Arousal/Alertness: Awake/alert ?Behavior During Therapy: [facility] for tasks assessed/performed ?Overall Cognitive Status: Within Functional Limits for tasks assessed ?  ?  ?  ?  ?  ?  ?  ?  ?  ?  ?  ?  ?  ?  ?  ?  ?General Comments: A&Ox3 self location and situation ?  ?  ? ?  ?General Comments   ? ?  ?Exercises Other Exercises ?Other Exercises: patient educated on role of PT in acute care setting, fall risk, log roll technique, and d/c recommendations  ? ?Assessment/Plan  ?  ?PT Assessment Patient needs continued PT services  ?PT Problem List Decreased strength;Decreased safety awareness;Decreased activity tolerance;Decreased balance;Decreased knowledge of use of DME;Pain ? ?   ?  ?PT Treatment Interventions DME instruction;Therapeutic activities;Therapeutic exercise;Gait training;Stair training;Balance training   ? ?PT Goals (Current goals can be found in the Care Plan section)  ?Acute Rehab PT Goals ?Patient Stated Goal: to go home ?PT Goal Formulation: With patient ?Time For Goal Achievement: 01/28/22 ?Potential to Achieve Goals: Good ? ?  ?  Frequency 7X/week ?  ? ? ?Co-evaluation   ?  ?  ?  ?  ? ? ?  ?AM-PAC PT "6 Clicks" Mobility  ?Outcome Measure Help needed turning from your back to your side while in a flat bed without using bedrails?: A Little ?Help needed moving from lying on your back to sitting on the side of a flat bed without using bedrails?: A Little ?Help needed moving to and from a bed to a chair  (including a wheelchair)?: A Little ?Help needed standing up from a chair using your arms (e.g., wheelchair or bedside chair)?: A Little ?Help needed to walk in hospital room?: A Little ?Help needed climbing 3-5

## 2022-01-14 NOTE — Progress Notes (Signed)
Attending Progress Note ? ?History: Andrew Walsh is here for neurogenic claudication. ? ?POD1: Doing well.  No leg pain.   ? ?Physical Exam: ?Vitals:  ? 01/14/22 0425 01/14/22 0751  ?BP: 129/69 (!) 158/79  ?Pulse: (!) 50 (!) 52  ?Resp: 18 18  ?Temp: 98.2 ?F (36.8 ?C) 98.2 ?F (36.8 ?C)  ?SpO2: 99% 97%  ? ? ?AA Ox3 ?CNI ? ?Strength:5/5 throughout BLE ? ?Data: ? ?Recent Labs  ?Lab 01/13/22 ?1144  ?CREATININE 0.92  ? ?No results for input(s): AST, ALT, ALKPHOS in the last 168 hours. ? ?Invalid input(s): TBILI  ? Recent Labs  ?Lab 01/13/22 ?1144  ?WBC 4.7  ?HGB 12.5*  ?HCT 36.7*  ?PLT 198  ? ?No results for input(s): APTT, INR in the last 168 hours.  ?   ? ? ?Other tests/results: n/a ? ?Assessment/Plan: ? ?ALANDO COLLERAN is doing well after decompressionj. ? ?- mobilize ?- pain control ?- DVT prophylaxis ?- PTOT ? ? ?Meade Maw MD, MPHS ?Department of Neurosurgery ? ? ? ?

## 2022-01-14 NOTE — Plan of Care (Signed)

## 2022-01-18 ENCOUNTER — Other Ambulatory Visit: Payer: Self-pay

## 2022-01-18 ENCOUNTER — Emergency Department: Payer: Medicare HMO

## 2022-01-18 ENCOUNTER — Observation Stay
Admission: EM | Admit: 2022-01-18 | Discharge: 2022-01-20 | Disposition: A | Discharge status: Home Health | Payer: Medicare HMO | Attending: Neurosurgery | Admitting: Neurosurgery

## 2022-01-18 ENCOUNTER — Encounter: Payer: Self-pay | Admitting: Emergency Medicine

## 2022-01-18 DIAGNOSIS — Z87891 Personal history of nicotine dependence: Secondary | ICD-10-CM | POA: Diagnosis not present

## 2022-01-18 DIAGNOSIS — M549 Dorsalgia, unspecified: Secondary | ICD-10-CM | POA: Diagnosis present

## 2022-01-18 DIAGNOSIS — Z85828 Personal history of other malignant neoplasm of skin: Secondary | ICD-10-CM | POA: Insufficient documentation

## 2022-01-18 DIAGNOSIS — S129XXA Fracture of neck, unspecified, initial encounter: Secondary | ICD-10-CM | POA: Diagnosis not present

## 2022-01-18 DIAGNOSIS — M545 Low back pain, unspecified: Secondary | ICD-10-CM | POA: Diagnosis not present

## 2022-01-18 DIAGNOSIS — M48061 Spinal stenosis, lumbar region without neurogenic claudication: Secondary | ICD-10-CM | POA: Diagnosis not present

## 2022-01-18 DIAGNOSIS — M542 Cervicalgia: Secondary | ICD-10-CM | POA: Diagnosis not present

## 2022-01-18 DIAGNOSIS — Z8546 Personal history of malignant neoplasm of prostate: Secondary | ICD-10-CM | POA: Diagnosis not present

## 2022-01-18 DIAGNOSIS — R001 Bradycardia, unspecified: Secondary | ICD-10-CM | POA: Diagnosis not present

## 2022-01-18 DIAGNOSIS — G8918 Other acute postprocedural pain: Secondary | ICD-10-CM | POA: Diagnosis not present

## 2022-01-18 DIAGNOSIS — M79652 Pain in left thigh: Secondary | ICD-10-CM | POA: Diagnosis not present

## 2022-01-18 DIAGNOSIS — M79651 Pain in right thigh: Secondary | ICD-10-CM | POA: Diagnosis not present

## 2022-01-18 LAB — CBC WITH DIFFERENTIAL/PLATELET
Abs Immature Granulocytes: 0.05 10*3/uL (ref 0.00–0.07)
Basophils Absolute: 0 10*3/uL (ref 0.0–0.1)
Basophils Relative: 0 %
Eosinophils Absolute: 0 10*3/uL (ref 0.0–0.5)
Eosinophils Relative: 0 %
HCT: 35.5 % — ABNORMAL LOW (ref 39.0–52.0)
Hemoglobin: 12.2 g/dL — ABNORMAL LOW (ref 13.0–17.0)
Immature Granulocytes: 1 %
Lymphocytes Relative: 8 %
Lymphs Abs: 0.8 10*3/uL (ref 0.7–4.0)
MCH: 36.1 pg — ABNORMAL HIGH (ref 26.0–34.0)
MCHC: 34.4 g/dL (ref 30.0–36.0)
MCV: 105 fL — ABNORMAL HIGH (ref 80.0–100.0)
Monocytes Absolute: 0.8 10*3/uL (ref 0.1–1.0)
Monocytes Relative: 8 %
Neutro Abs: 8.3 10*3/uL — ABNORMAL HIGH (ref 1.7–7.7)
Neutrophils Relative %: 83 %
Platelets: 222 10*3/uL (ref 150–400)
RBC: 3.38 MIL/uL — ABNORMAL LOW (ref 4.22–5.81)
RDW: 12.6 % (ref 11.5–15.5)
WBC: 10 10*3/uL (ref 4.0–10.5)
nRBC: 0 % (ref 0.0–0.2)

## 2022-01-18 LAB — COMPREHENSIVE METABOLIC PANEL
ALT: 20 U/L (ref 0–44)
AST: 22 U/L (ref 15–41)
Albumin: 4 g/dL (ref 3.5–5.0)
Alkaline Phosphatase: 46 U/L (ref 38–126)
Anion gap: 5 (ref 5–15)
BUN: 16 mg/dL (ref 8–23)
CO2: 24 mmol/L (ref 22–32)
Calcium: 9.4 mg/dL (ref 8.9–10.3)
Chloride: 106 mmol/L (ref 98–111)
Creatinine, Ser: 0.72 mg/dL (ref 0.61–1.24)
GFR, Estimated: >60 mL/min (ref >60)
Glucose, Bld: 153 mg/dL — ABNORMAL HIGH (ref 70–99)
Potassium: 4.9 mmol/L (ref 3.5–5.1)
Sodium: 135 mmol/L (ref 135–145)
Total Bilirubin: 1 mg/dL (ref 0.3–1.2)
Total Protein: 7.3 g/dL (ref 6.5–8.1)

## 2022-01-18 LAB — URINALYSIS, ROUTINE W REFLEX MICROSCOPIC
Bacteria, UA: NONE SEEN
Bilirubin Urine: NEGATIVE
Glucose, UA: NEGATIVE mg/dL
Ketones, ur: NEGATIVE mg/dL
Leukocytes,Ua: NEGATIVE
Nitrite: NEGATIVE
Protein, ur: NEGATIVE mg/dL
Specific Gravity, Urine: 1.013 (ref 1.005–1.030)
Squamous Epithelial / HPF: NONE SEEN (ref 0–5)
pH: 6 (ref 5.0–8.0)

## 2022-01-18 LAB — TROPONIN I (HIGH SENSITIVITY): Troponin I (High Sensitivity): 7 ng/L (ref <18)

## 2022-01-18 MED ORDER — MORPHINE SULFATE (PF) 2 MG/ML IV SOLN: 2.0000 mg | INTRAVENOUS | Status: DC | PRN

## 2022-01-18 MED ORDER — OXYCODONE HCL 5 MG PO TABS: 10.0000 mg | ORAL_TABLET | ORAL | Status: DC | PRN

## 2022-01-18 MED ORDER — SODIUM CHLORIDE 0.9 % IV SOLN: 250.0000 mL | INTRAVENOUS | Status: DC

## 2022-01-18 MED ORDER — SODIUM CHLORIDE 0.9% FLUSH
3.0000 mL | Freq: Two times a day (BID) | INTRAVENOUS | Status: DC
  Administered 2022-01-18 – 2022-01-19 (×2): 3 mL via INTRAVENOUS

## 2022-01-18 MED ORDER — FLEET ENEMA 7-19 GM/118ML RE ENEM: 1.0000 | ENEMA | Freq: Once | RECTAL | Status: DC | PRN

## 2022-01-18 MED ORDER — COLESEVELAM HCL 625 MG PO TABS
1875.0000 mg | ORAL_TABLET | Freq: Two times a day (BID) | ORAL | Status: DC
  Administered 2022-01-20: 1875 mg via ORAL
  Filled 2022-01-18 (×4): qty 3

## 2022-01-18 MED ORDER — NAPROXEN 500 MG PO TABS
500.0000 mg | ORAL_TABLET | Freq: Every day | ORAL | Status: DC | PRN
  Filled 2022-01-18: qty 1

## 2022-01-18 MED ORDER — ONDANSETRON HCL 4 MG/2ML IJ SOLN: 4.0000 mg | Freq: Four times a day (QID) | INTRAMUSCULAR | Status: DC | PRN

## 2022-01-18 MED ORDER — LEVOTHYROXINE SODIUM 50 MCG PO TABS
75.0000 ug | ORAL_TABLET | Freq: Every day | ORAL | Status: DC
  Administered 2022-01-19 – 2022-01-20 (×2): 75 ug via ORAL
  Filled 2022-01-18 (×2): qty 1

## 2022-01-18 MED ORDER — HYDROMORPHONE HCL 1 MG/ML IJ SOLN
0.5000 mg | Freq: Once | INTRAMUSCULAR | Status: AC
  Administered 2022-01-18: 0.5 mg via INTRAVENOUS
  Filled 2022-01-18: qty 0.5

## 2022-01-18 MED ORDER — METHOCARBAMOL 500 MG PO TABS
500.0000 mg | ORAL_TABLET | Freq: Four times a day (QID) | ORAL | Status: DC | PRN
  Filled 2022-01-18 (×2): qty 1

## 2022-01-18 MED ORDER — BISACODYL 10 MG RE SUPP
10.0000 mg | Freq: Every day | RECTAL | Status: DC | PRN
Start: 2022-01-18 — End: 2022-01-20
  Filled 2022-01-18: qty 1

## 2022-01-18 MED ORDER — SENNA 8.6 MG PO TABS
1.0000 | ORAL_TABLET | Freq: Two times a day (BID) | ORAL | Status: DC
  Administered 2022-01-18 – 2022-01-20 (×3): 8.6 mg via ORAL
  Filled 2022-01-18 (×3): qty 1

## 2022-01-18 MED ORDER — GADOBUTROL 1 MMOL/ML IV SOLN
7.5000 mL | Freq: Once | INTRAVENOUS | Status: AC | PRN
  Administered 2022-01-18: 7.5 mL via INTRAVENOUS
  Filled 2022-01-18: qty 7.5

## 2022-01-18 MED ORDER — OXYCODONE HCL 5 MG PO TABS: 5.0000 mg | ORAL_TABLET | ORAL | Status: DC | PRN

## 2022-01-18 MED ORDER — SIMVASTATIN 20 MG PO TABS
20.0000 mg | ORAL_TABLET | ORAL | Status: DC
  Administered 2022-01-18: 20 mg via ORAL
  Filled 2022-01-18: qty 1

## 2022-01-18 MED ORDER — KETOROLAC TROMETHAMINE 15 MG/ML IJ SOLN
7.5000 mg | Freq: Four times a day (QID) | INTRAMUSCULAR | Status: AC
  Administered 2022-01-18 – 2022-01-19 (×2): 7.5 mg via INTRAVENOUS
  Filled 2022-01-18 (×3): qty 1

## 2022-01-18 MED ORDER — POLYETHYLENE GLYCOL 3350 17 G PO PACK: 17.0000 g | PACK | Freq: Every day | ORAL | Status: DC | PRN

## 2022-01-18 MED ORDER — DIAZEPAM 5 MG/ML IJ SOLN
2.5000 mg | Freq: Once | INTRAMUSCULAR | Status: AC
  Administered 2022-01-18: 2.5 mg via INTRAVENOUS
  Filled 2022-01-18: qty 2

## 2022-01-18 MED ORDER — ACETAMINOPHEN 650 MG RE SUPP: 650.0000 mg | RECTAL | Status: DC | PRN

## 2022-01-18 MED ORDER — ONDANSETRON HCL 4 MG PO TABS: 4.0000 mg | ORAL_TABLET | Freq: Four times a day (QID) | ORAL | Status: DC | PRN

## 2022-01-18 MED ORDER — ACETAMINOPHEN 325 MG PO TABS: 650.0000 mg | ORAL_TABLET | ORAL | Status: DC | PRN

## 2022-01-18 MED ORDER — SODIUM CHLORIDE 0.9% FLUSH: 3.0000 mL | INTRAVENOUS | Status: DC | PRN

## 2022-01-18 MED ORDER — ACETAMINOPHEN 500 MG PO TABS
1000.0000 mg | ORAL_TABLET | Freq: Four times a day (QID) | ORAL | Status: AC
  Administered 2022-01-18 – 2022-01-19 (×3): 1000 mg via ORAL
  Filled 2022-01-18 (×3): qty 2

## 2022-01-18 MED ORDER — BUTALBITAL-APAP-CAFFEINE 50-325-40 MG PO TABS
1.0000 | ORAL_TABLET | Freq: Once | ORAL | Status: AC
  Administered 2022-01-18: 1 via ORAL
  Filled 2022-01-18: qty 1

## 2022-01-18 MED ORDER — VITAMIN A 3 MG (10000 UNIT) PO CAPS
10000.0000 [IU] | ORAL_CAPSULE | Freq: Every day | ORAL | Status: DC
  Administered 2022-01-20: 10000 [IU] via ORAL
  Filled 2022-01-18 (×2): qty 1

## 2022-01-18 MED ORDER — CYANOCOBALAMIN 500 MCG PO TABS
500.0000 ug | ORAL_TABLET | Freq: Every day | ORAL | Status: DC
  Administered 2022-01-20: 500 ug via ORAL
  Filled 2022-01-18 (×2): qty 1

## 2022-01-18 MED ORDER — DOCUSATE SODIUM 100 MG PO CAPS
100.0000 mg | ORAL_CAPSULE | Freq: Two times a day (BID) | ORAL | Status: DC
  Administered 2022-01-18 – 2022-01-20 (×3): 100 mg via ORAL
  Filled 2022-01-18 (×3): qty 1

## 2022-01-18 MED ORDER — DEXAMETHASONE SODIUM PHOSPHATE 10 MG/ML IJ SOLN
4.0000 mg | Freq: Four times a day (QID) | INTRAMUSCULAR | Status: DC
  Administered 2022-01-18 – 2022-01-20 (×5): 4 mg via INTRAVENOUS
  Filled 2022-01-18 (×8): qty 0.4

## 2022-01-18 NOTE — Consult Note (Signed)
Neurosurgery-New Consultation Evaluation ?01/18/2022 ?Andrew Walsh [id number] ? ?Identifying Statement: ?Andrew Walsh is a 81 y.o. male from [place] [address]  ? ?Physician Requesting Consultation: No ref. provider found ? ?History of Present Illness: ?Andrew Walsh is a 81 y/o male s/p L3-S1 decompression on 01/13/22. He was admitted overnight and discharged on 01/14/22. Mr. Kepner reports experiencing pain in his neck with range of motion worse when sitting up and radiating pain down the posterior aspects of both legs to the knees since about post-op day 2. He states this is worse with movement. All of his symptoms seem to be acerbated by sitting and walking. These symptoms persisted and worsened despite medication changes prompting further evaluation and presentation to the ER. He denies any incision concerns or fevers.  ? ?Past Medical History:  ?Past Medical History:  ?Diagnosis Date  ? Actinic keratosis 01/31/2016  ? Left lateral neck post auricular. AK and prurigo nodularis  ? Actinic keratosis 01/31/2016  ? Left lateral neck post auricular, inferior. AK and prurigo nodularis  ? Actinic keratosis 12/19/2017  ? Left ear anti-helix  ? Ankle arthropathy 02/24/2014  ? Arthritis of foot, degenerative 10/03/2012  ? Overview:  Subtalar arthritis   ? Basal cell carcinoma 04/30/2008  ? Left mid dorsum nose.  ? Basal cell carcinoma 07/12/2009  ? Left post. deltoid sup. lat. edge of cancer scar.   ? Cancer [facility])   ? PROSTATE  ? Elevated PSA   ? Hereditary hemochromatosis ([place]) 11/03/2015  ? Overview:  Treated by regular blood donation   ? History of kidney stones   ? HLD (hyperlipidemia) 11/03/2015  ? Hyperlipidemia   ? Inguinal hernia   ? Localized, primary osteoarthritis of ankle or foot 02/24/2014  ? Osteoarthritis   ? Squamous cell carcinoma of skin 05/17/2010  ? Left forearm. KA-like pattern. EDC.  ? Squamous cell carcinoma of skin 12/18/2016  ? Right mid lat. volar forearm.WD SCC. EDC  ? Squamous cell carcinoma  of skin 01/22/2019  ? Right lateral distal tricep near elbow.   ? Squamous cell carcinoma of skin 07/14/2019  ? Right forearm. WD SCC. EDC  ? Squamous cell carcinoma of skin 09/29/2019  ? Right proximal mandible. SCCis  ? Squamous cell carcinoma of skin 08/17/2020  ? Left ear sup helix  ? ? ?Social History: ?Social History  ? ?Socioeconomic History  ? Marital status: Married  ?  Spouse name: Not on file  ? Number of children: Not on file  ? Years of education: Not on file  ? Highest education level: Not on file  ?Occupational History  ? Not on file  ?Tobacco Use  ? Smoking status: Former  ?  Years: 20.00  ?  Types: Cigarettes  ?  Quit date: 09/10/1979  ?  Years since quitting: 42.3  ? Smokeless tobacco: Never  ?Vaping Use  ? Vaping Use: Never used  ?Substance and Sexual Activity  ? Alcohol use: Yes  ?  Alcohol/week: 32.0 standard drinks  ?  Types: 16 Glasses of wine, 16 Standard drinks or equivalent per week  ? Drug use: Never  ? Sexual activity: Not Currently  ?Other Topics Concern  ? Not on file  ?Social History Narrative  ? Live at home with wife.  ? ?Social Determinants of Health  ? ?Financial Resource Strain: Not on file  ?Food Insecurity: Not on file  ?Transportation Needs: Not on file  ?Physical Activity: Not on file  ?Stress: Not on file  ?Social Connections: Not on  file  ?Intimate Partner Violence: Not on file  ? ?Living arrangements (living alone, with partner): wife ? ?Family History: ?Family History  ?Problem Relation Age of Onset  ? Hemachromatosis Sister   ? Bladder Cancer Neg Hx   ? Prostate cancer Neg Hx   ? Kidney cancer Neg Hx   ? ? ?Review of Systems: ? ?Review of Systems - General ROS: Negative ?Psychological ROS: Negative ?Ophthalmic ROS: Negative ?ENT ROS: Negative ?Hematological and Lymphatic ROS: Negative  ?Endocrine ROS: Negative ?Respiratory ROS: Negative ?Cardiovascular ROS: Negative ?Gastrointestinal ROS: Negative ?Genito-Urinary ROS: Negative ?Musculoskeletal ROS: Negative ?Neurological  ROS: Negative ?Dermatological ROS: Negative ? ?Physical Exam: ?BP (!) 184/97 (BP Location: Left Arm)   Pulse (!) 54   Temp 98.1 ?F (36.7 ?C)   Resp 16   Ht '5\' 7"'$  (1.702 m)   Wt 75 kg   SpO2 97%   BMI 25.90 kg/m?  Body mass index is 25.9 kg/m?Marland Kitchen Body surface area is 1.88 meters squared. ?General appearance: Alert, cooperative, in no acute distress ?Head: Normocephalic, atraumatic ?Eyes: Normal, EOM intact ?Oropharynx: Moist without lesions ?Neck: Supple, no tenderness ?Heart: Normal, regular rate and rhythm, without murmur ?Lungs: Clear to auscultation, good air exchange ?Abdomen: Soft, nondistended ?Ext: No edema in LE bilaterally, good distal pulses ? ?Neurologic exam:  ?Mental status: alertness: alert, orientation: person, place, time, affect: normal ?Speech: fluent and clear ?Cranial nerves:  ?CN II-XII grossly intact  ?Motor:strength symmetric 5/5, except 4+ in left PF. normal muscle mass and tone in all extremities and no pronator drift ?Sensory: intact to light touch in all extremities ?Reflexes: 2+ and symmetric bilaterally for arms and legs ?Coordination: intact finger to nose ?Gait: untested ? ?Laboratory: ?Results for orders placed or performed during the hospital encounter of 01/18/22  ?CBC with Differential  ?Result Value Ref Range  ? WBC 10.0 4.0 - 10.5 K/uL  ? RBC 3.38 (L) 4.22 - 5.81 MIL/uL  ? Hemoglobin 12.2 (L) 13.0 - 17.0 g/dL  ? HCT 35.5 (L) 39.0 - 52.0 %  ? MCV 105.0 (H) 80.0 - 100.0 fL  ? MCH 36.1 (H) 26.0 - 34.0 pg  ? MCHC 34.4 30.0 - 36.0 g/dL  ? RDW 12.6 11.5 - 15.5 %  ? Platelets 222 150 - 400 K/uL  ? nRBC 0.0 0.0 - 0.2 %  ? Neutrophils Relative % 83 %  ? Neutro Abs 8.3 (H) 1.7 - 7.7 K/uL  ? Lymphocytes Relative 8 %  ? Lymphs Abs 0.8 0.7 - 4.0 K/uL  ? Monocytes Relative 8 %  ? Monocytes Absolute 0.8 0.1 - 1.0 K/uL  ? Eosinophils Relative 0 %  ? Eosinophils Absolute 0.0 0.0 - 0.5 K/uL  ? Basophils Relative 0 %  ? Basophils Absolute 0.0 0.0 - 0.1 K/uL  ? Immature Granulocytes 1 %  ? Abs  Immature Granulocytes 0.05 0.00 - 0.07 K/uL  ?Comprehensive metabolic panel  ?Result Value Ref Range  ? Sodium 135 135 - 145 mmol/L  ? Potassium 4.9 3.5 - 5.1 mmol/L  ? Chloride 106 98 - 111 mmol/L  ? CO2 24 22 - 32 mmol/L  ? Glucose, Bld 153 (H) 70 - 99 mg/dL  ? BUN 16 8 - 23 mg/dL  ? Creatinine, Ser 0.72 0.61 - 1.24 mg/dL  ? Calcium 9.4 8.9 - 10.3 mg/dL  ? Total Protein 7.3 6.5 - 8.1 g/dL  ? Albumin 4.0 3.5 - 5.0 g/dL  ? AST 22 15 - 41 U/L  ? ALT 20 0 - 44 U/L  ?  Alkaline Phosphatase 46 38 - 126 U/L  ? Total Bilirubin 1.0 0.3 - 1.2 mg/dL  ? GFR, Estimated >60 >60 mL/min  ? Anion gap 5 5 - 15  ? ?I personally reviewed labs ? ?Imaging: ?No results found for this or any previous visit. ? ?Impression/Plan:  ? ? ? ?1.  Diagnosis: pain post-op ? ?2.  Plan ?- agree with plan for MRI ?- pain management.  ?- will re-evaluate POC after imaging completed ? ?Cooper Render PA-C ?Neurosurgery  ? ?  ?

## 2022-01-18 NOTE — ED Triage Notes (Signed)
C/O ' extreme pain' to bilateral legs and neck.  States pain has been ongoing since back surgery on Friday May 5th. ? ?Has taken oxycodone at 1000 ?

## 2022-01-18 NOTE — ED Notes (Signed)
Patient transported to MRI 

## 2022-01-18 NOTE — ED Provider Notes (Signed)
Patient signed out to me pending EKG troponin MRI cervical spine and lumbar spine. ? ?Troponin is negative.  EKG shows sinus bradycardia with first-degree AV block, with left axis deviation and nonspecific ST/T wave flattening. ? ?MRI cervical spine: ?IMPRESSION:  ?1. Moderately advanced cervical disc and facet degeneration with  ?mild spinal stenosis at C3-4, C4-5, and C6-7.  ?2. Moderate to severe multilevel neural foraminal stenosis, greatest  ?at C4-5 and C6-7.  ?   ? ?MRI lumbar spine: ?IMPRESSION:  ?Postoperative changes from L3-S1 posterior decompression with dorsal  ?epidural and subdural fluid collections throughout the lumbar spine  ?as detailed above. There are no findings specifically suggestive of  ?infection, although infection is not excluded by imaging. The  ?collections and degenerative changes result in severe spinal  ?stenosis at L3-4, L4-5, and L5-S1 and moderate spinal stenosis at  ?L2-3.  ?   ? ?Dr. Cari Caraway at bedside discussing results and plan with the patient.  Plan is for admission for observation. ?  ?Rada Hay, MD ?01/18/22 1642 ? ?

## 2022-01-18 NOTE — Plan of Care (Signed)

## 2022-01-18 NOTE — ED Provider Notes (Signed)
? ?[facility] ?Provider Note ? ? ? Event Date/Time  ? First MD Initiated Contact with Patient 01/18/22 1030   ?  (approximate) ? ? ?History  ? ?Back Pain ? ? ?HPI ? ?Andrew Walsh is a 81 y.o. male who comes in for back pain.  Patient denies any back pain per se.  He states that he was having some right leg sciatica and that that has gone away but now when he has certain movements he has severe stabbing pains in his lateral legs bilaterally.  He has good strength in his legs no numbness or tingling.  No known defecating or urinating on himself.  Does have a history of prostate cancer but is currently in remission.  He reports this is new pain since having the surgery.  He had talked to the nurse who increased his oxycodone from 5 to 7.5 mg that Tylenol to it.  They also started him on a dexamethasone taper yesterday but he reports severe pain still.  He also reports some discomfort in his upper neck that feels like his vertebrae are being compressed.  He reports his pain is more constant in nature. ? ? ?On review of records patient was admitted on 01/13/2022 for surgery at L3-S1.  This was for severe stenosis.  Patient was discharged on oxycodone 5 mg every 3 hours as needed for moderate pain ? ? ? ?Physical Exam  ? ?Triage Vital Signs: ?ED Triage Vitals  ?Enc Vitals Group  ?   BP 01/18/22 1020 (!) 184/97  ?   Pulse Rate 01/18/22 1020 (!) 54  ?   Resp 01/18/22 1020 16  ?   Temp 01/18/22 1020 98.1 ?F (36.7 ?C)  ?   Temp src --   ?   SpO2 01/18/22 1020 97 %  ?   Weight 01/18/22 1021 165 lb 5.5 oz (75 kg)  ?   Height 01/18/22 1021 '5\' 7"'$  (1.702 m)  ?   Head Circumference --   ?   Peak Flow --   ?   Pain Score 01/18/22 1021 10  ?   Pain Loc --   ?   Pain Edu? --   ?   Excl. in [place]? --   ? ? ?Most recent vital signs: ?Vitals:  ? 01/18/22 1020  ?BP: (!) 184/97  ?Pulse: (!) 54  ?Resp: 16  ?Temp: 98.1 ?F (36.7 ?C)  ?SpO2: 97%  ? ? ? ?General: Awake, no distress.  ?CV:  Good peripheral perfusion.   ?Resp:  Normal effort.  ?Abd:  No distention.  ?Other:  Patient does not really have any midline neck pain he just reports a compression feeling.  He has good strength in his arms without any sensation changes.  He is a good strength in his legs able to pick his legs up off the bed and has normal sensation.  He has a well-healing surgical scar in his lumbar area.  He denies any T-spine or L-spine tenderness. ? ? ?ED Results / Procedures / Treatments  ? ?Labs ?(all labs ordered are listed, but only abnormal results are displayed) ?Labs Reviewed  ?CBC WITH DIFFERENTIAL/PLATELET  ?COMPREHENSIVE METABOLIC PANEL  ? ? ? ?RADIOLOGY ?MRI PENDING  ? ? ?PROCEDURES: ? ?Critical Care performed: No ? ?Procedures ? ? ?MEDICATIONS ORDERED IN ED: ?Medications - No data to display ? ? ?IMPRESSION / MDM / ASSESSMENT AND PLAN / ED COURSE  ?I reviewed the triage vital signs and the nursing notes. ? ?Patient  comes in with postop pain.  Denies having this pain previously.  Recently increased on his oxycodone as well as placed on steroids.  Postvoid bladder scan 150 cc so no evidence of significant retention he has a history of enlarged prostate.  His neuro exam otherwise seems reassuring.  I have discussed the case with the neurosurgery team Dr. Cari Caraway who did his surgery but he was currently in surgery.  Given I had not heard him from him for a little while I did order MRI as well waiting to talk with him further and given a dose of IV Dilaudid.  Patient denies any improvement in symptoms.  I wonder if the neck pain could be musculoskeletal in regards to probable intubation. ? ? ?12:58 PM neurosurgery at bedside and recommends trying a little bit of Valium to see if that helps with the muscle spasms ? ?Neuro surgery evaluating patient also recommends trying a little bit of Fioricet.  Going to get MRIs to further evaluate. ? ?UA without evidence of UTI.  CBC shows normal white count stable hemoglobin.  CMP normal ? ? ?Patient noted  to have slightly low heart rate therefore added on troponin and EKG but patient is currently in MRI.  Patient be handed off to oncoming team pending MRI and further discussion with neurosurgery ? ?  ? ? ?FINAL CLINICAL IMPRESSION(S) / ED DIAGNOSES  ? ?Final diagnoses:  ?Post-op pain  ? ? ? ?Rx / DC Orders  ? ?ED Discharge Orders   ? ? None  ? ?  ? ? ? ?Note:  This document was prepared using Dragon voice recognition software and may include unintentional dictation errors. ?  ?Vanessa [place], MD ?01/18/22 1448 ? ?

## 2022-01-18 NOTE — ED Notes (Signed)
Mri on the phone to screen the pt, Andrew Walsh able to answer questions ?

## 2022-01-18 NOTE — ED Notes (Signed)
Informed RN bed assigned 

## 2022-01-19 DIAGNOSIS — M542 Cervicalgia: Secondary | ICD-10-CM | POA: Diagnosis not present

## 2022-01-19 DIAGNOSIS — M79651 Pain in right thigh: Secondary | ICD-10-CM | POA: Diagnosis not present

## 2022-01-19 DIAGNOSIS — M79652 Pain in left thigh: Secondary | ICD-10-CM | POA: Diagnosis not present

## 2022-01-19 NOTE — TOC Progression Note (Addendum)
Transition of Care (TOC) - Progression Note  ? ? ?Patient Details  ?Name: Andrew Walsh ?MRN: [id number] ?Date of Birth: Apr 06, 1941 ? ?Transition of Care (TOC) CM/SW Contact  ?Conception Oms, RN ?Phone Number: ?01/19/2022, 4:11 PM ? ?Clinical Narrative:    ? ?Patient called me back and stated that he was not set up previously with [facility] services, he would like to be set up with [facility] notified [facility] and they are checking to see if they can accept the patient, They can accept the patient for [facility] services ? ?Expected Discharge Plan: [place] ?Barriers to Discharge: Continued Medical Work up ? ?Expected Discharge Plan and Services ?Expected Discharge Plan: [place] ?  ?Discharge Planning Services: CM Consult ?  ?Living arrangements for the past 2 months: [place] ?                ?DME Arranged: 3-N-1 ?DME Agency: [employer] ?Date DME Agency Contacted: 01/19/22 ?Time DME Agency Contacted: [phone] ?Representative spoke with at DME Agency: intake ?HH Arranged: PT ?[place] Agency: [place] ?Date HH Agency Contacted: 01/19/22 ?Time [place]: 1556 ?Representative spoke with at [place]: Penni Homans ? ? ?Social Determinants of Health (SDOH) Interventions ?  ? ?Readmission Risk Interventions ?   ? View : No data to display.  ?  ?  ?  ? ? ?

## 2022-01-19 NOTE — Evaluation (Signed)
Occupational Therapy Evaluation Patient Details Name: Andrew Walsh MRN: [id number] DOB: 02-22-1941 Today's Date: 01/19/2022   History of Present Illness 81 y/o male s/p L3-S1 decompression on 01/13/22. He was admitted overnight and discharged on 01/14/22. He returned to the ER on 01/18/22 due to worsening bilateral posterior thigh pain and neck pain. MRI showed severe L3-4 and L4-5 stenosis.   Clinical Impression   Pt seen for OT evaluation this date. Prior to hospital admission, pt was using RW for mobility since recent back surgery and was requiring limited assist from spouse for ADL/IADL. Pt reports having increased difficulty with all aspects of mobility and ADL due to low back pain (sharp shooting down his legs) and cervical pain. Pt lives with spouse in an apartment with 18 steps to enter. Currently pt requires CGA for bed mobility (educated in log roll technique), CGA with ADL transfers with VC for safety/RW mgt, and PRN MIN A for LB ADL tasks 2/2 cervical pain. Pt denied low back pain throughout but did demonstrate decreased tolerance for mobility and unsupported sitting due to increasing cervical pain. Pt sat in the recliner for ~66min before needing to return to bed due to pain. Pt educated in role of OT, falls prevention, RW mgt, back precautions/body mechanics and how to maintain during ADL/mobility, and AE/DME for LB ADL tasks to minimize back/neck pain. Pt verbalized understanding. Recommend additional skilled OT services to maximize safety/indep and return to PLOF with less pain. HHOT at discharge.    Recommendations for follow up therapy are one component of a multi-disciplinary discharge planning process, led by the attending physician.  Recommendations may be updated based on patient status, additional functional criteria and insurance authorization.   Follow Up Recommendations  Home health OT    Assistance Recommended at Discharge Frequent or constant Supervision/Assistance  Patient  can return home with the following Assistance with cooking/housework;Assist for transportation;Help with stairs or ramp for entrance;A little help with bathing/dressing/bathroom;A little help with walking and/or transfers    Functional Status Assessment  Patient has had a recent decline in their functional status and demonstrates the ability to make significant improvements in function in a reasonable and predictable amount of time.  Equipment Recommendations  Other (comment) Lexicographer)    Recommendations for Other Services       Precautions / Restrictions Precautions Precautions: Fall;Back Precaution Booklet Issued: No Precaution Comments: recent lumbar back surgery, maintained back precautions Restrictions Weight Bearing Restrictions: No      Mobility Bed Mobility Overal bed mobility: Needs Assistance Bed Mobility: Rolling, Sidelying to Sit, Sit to Sidelying Rolling: Min guard Sidelying to sit: Min guard     Sit to sidelying: Supervision      Transfers Overall transfer level: Needs assistance Equipment used: Rolling walker (2 wheels) Transfers: Sit to/from Stand Sit to Stand: Min guard           General transfer comment: VC for hand placement and to slow down as he initially moves quickly and attempts to pull up the RW      Balance Overall balance assessment: Needs assistance Sitting-balance support: No upper extremity supported, Feet supported Sitting balance-Leahy Scale: Good     Standing balance support: Bilateral upper extremity supported Standing balance-Leahy Scale: Fair Standing balance comment: CGA for mobility with RW, no overt LOB                           ADL either performed or assessed with clinical judgement  ADL Overall ADL's : Needs assistance/impaired                 Upper Body Dressing : Set up;Sitting   Lower Body Dressing: Minimal assistance;Sit to/from stand;Min guard Lower Body Dressing Details (indicate cue type  and reason): PRN MIN A for LB ADL tasks 2/2 cervical pain, cues for mod techniques to minimize back/neck pain Toilet Transfer: Min guard;Rolling walker (2 wheels)           Functional mobility during ADLs: Min guard;Rolling walker (2 wheels);Cueing for safety General ADL Comments: limited standing/mobility tolerance and unsupported sitting 2/2 increasing cervical neck pain but able to complete in-room distances     Vision         Perception     Praxis      Pertinent Vitals/Pain Pain Assessment Pain Assessment: 0-10 Pain Score: 6  Pain Location: initially 2-3/10 cervical pain at rest, increasing to 6/10 with time in unsupported positions Pain Descriptors / Indicators: Sharp Pain Intervention(s): Limited activity within patient's tolerance, Monitored during session, Premedicated before session, Repositioned     Hand Dominance Right   Extremity/Trunk Assessment Upper Extremity Assessment Upper Extremity Assessment: Overall WFL for tasks assessed   Lower Extremity Assessment Lower Extremity Assessment: Overall WFL for tasks assessed   Cervical / Trunk Assessment Cervical / Trunk Assessment: Back Surgery   Communication Communication Communication: No difficulties   Cognition Arousal/Alertness: Awake/alert Behavior During Therapy: WFL for tasks assessed/performed Overall Cognitive Status: Within Functional Limits for tasks assessed                                 General Comments: grossly WFL, impulsive at times requiring VC for safety     General Comments       Exercises Other Exercises Other Exercises: Pt educated in role of OT, falls prevention, RW mgt, back precautions/body mechanics and how to maintain during ADL/mobility, and AE/DME for LB ADL tasks to minimize back/neck pain.   Shoulder Instructions      Home Living Family/patient expects to be discharged to:: Private residence Living Arrangements: Spouse/significant other Available Help  at Discharge: Family;Available 24 hours/day Type of Home: Apartment Home Access: Stairs to enter Entrance Stairs-Number of Steps: 18 Entrance Stairs-Rails: Left (wall on right) Home Layout: One level     Bathroom Shower/Tub: Producer, television/film/video: Standard     Home Equipment: Agricultural consultant (2 wheels);Cane - single point;Crutches;Shower seat;Grab bars - toilet;Grab bars - tub/shower          Prior Functioning/Environment Prior Level of Function : Independent/Modified Independent             Mobility Comments: amb with no AD home and community distances but does report having to furniture walk 2/2 pain, has been using RW almost exclusively since back surgery  but was limited in mobility ADLs Comments: MOD I-I in ADL prior to surgery, PRN assist after surgery for LB ADL and IADL        OT Problem List: Pain;Decreased range of motion;Decreased safety awareness;Impaired balance (sitting and/or standing);Decreased knowledge of use of DME or AE;Decreased knowledge of precautions      OT Treatment/Interventions: Self-care/ADL training;Patient/family education;DME and/or AE instruction;Therapeutic activities    OT Goals(Current goals can be found in the care plan section) Acute Rehab OT Goals Patient Stated Goal: have less pain OT Goal Formulation: With patient Time For Goal Achievement: 02/02/22 Potential to Achieve Goals:  Good ADL Goals Pt Will Perform Lower Body Dressing: with modified independence;with adaptive equipment Pt Will Transfer to Toilet: with modified independence Pt Will Perform Toileting - Clothing Manipulation and hygiene: with modified independence Additional ADL Goal #1: Pt will complete bed mobility with mod indep using learned strategies to minimize back/neck pain. Additional ADL Goal #2: Pt will complete all aspects of bathing primarily from seated position with mod indep, utilizing learned strategies to minimize back/neck pain.  OT Frequency:  Min 2X/week    Co-evaluation              AM-PAC OT "6 Clicks" Daily Activity     Outcome Measure Help from another person eating meals?: None Help from another person taking care of personal grooming?: None Help from another person toileting, which includes using toliet, bedpan, or urinal?: A Little Help from another person bathing (including washing, rinsing, drying)?: A Little Help from another person to put on and taking off regular upper body clothing?: None Help from another person to put on and taking off regular lower body clothing?: A Little 6 Click Score: 21   End of Session Equipment Utilized During Treatment: Rolling walker (2 wheels);Gait belt  Activity Tolerance: Patient tolerated treatment well Patient left: in bed;with call bell/phone within reach;with bed alarm set  OT Visit Diagnosis: Unsteadiness on feet (R26.81);Pain Pain - Right/Left:  (cervical)                Time: 5284-1324 OT Time Calculation (min): 29 min Charges:  OT General Charges $OT Visit: 1 Visit OT Evaluation $OT Eval Moderate Complexity: 1 Mod OT Treatments $Self Care/Home Management : 8-22 mins  Arman Filter., MPH, MS, OTR/L ascom [phone] 01/19/22, 1:02 PM

## 2022-01-19 NOTE — TOC Progression Note (Signed)
Transition of Care (TOC) - Progression Note  ? ? ?Patient Details  ?Name: Andrew Walsh ?MRN: [id number] ?Date of Birth: [birth year] ? ?Transition of Care (TOC) CM/SW Contact  ?Conception Oms, RN ?Phone Number: ?01/19/2022, 3:50 PM ? ?Clinical Narrative:    ? ?Met with the patient and his wife at the bedside,  ?He stated that he was set up with [facility] services after his last surgery 5/5 but is not remembering the name of the agency, he would like to continue with [facility], He also would like to get a 3 in 1, I notified [employer] of the need, It will be delivered to the bedside ? ?  ?  ? ?Expected Discharge Plan and Services ?  ?  ?  ?  ?  ?                ?  ?  ?  ?  ?  ?  ?  ?  ?  ?  ? ? ?Social Determinants of Health (SDOH) Interventions ?  ? ?Readmission Risk Interventions ?   ? View : No data to display.  ?  ?  ?  ? ? ?

## 2022-01-19 NOTE — Progress Notes (Cosign Needed)
Patient is not able to walk the distance required to go the bathroom, or he/she is unable to safely negotiate stairs required to access the bathroom.  A 3in1 BSC will alleviate this problem  

## 2022-01-19 NOTE — Evaluation (Signed)
Physical Therapy Evaluation ?Patient Details ?Name: Andrew Walsh ?MRN: [id number] ?DOB: 1941/07/12 ?Today's Date: 01/19/2022 ? ?History of Present Illness ? 81 y/o male s/p L3-S1 decompression on 01/13/22. He was admitted overnight and discharged on 01/14/22. He returned to the ER on 01/18/22 due to worsening bilateral posterior thigh pain and neck pain. MRI showed severe L3-4 and L4-5 stenosis. ? ?  ?Clinical Impression ? Pt received in Semi-Fowler's position and agreeable to therapy.  Pt performed bed mobility while maintaining back precautions.  Pt then transferred to standing with god technique and was able to ambulate aroudn nursing station with god technique.  Pt then returned to bed to take a seated rest break prior to attempting stair training.  Pt ambulated to stairwell and was able to navigate stairs with verbal cuing.  Pt ambulated forward at first attempt using bilateral railings and alternating steps.  Pt encouraged to use step-to pattern but continued to perform alternating pattern.  Pt then attempted to ambulated up the stairs using one railing and sideways up the steps.  Pt performed well with step-to pattern the first 2-3 steps, then attempted alternating pattern sideway and would not listen to verbal cuing until therapist told him to stop completely.  Pt educated on proper performance and safety when ambulating to prevent falls.  Pt able to descend with good technique using step-to pattern.  Safety awareness is not present at times, and will continue to be monitored.  Pt is stable when walking, just makes questionable decisions throughout mobility.  Current discharge plans to HHPT are appropriate at this time.  Pt will continue to benefit from skilled therapy in order to address deficits listed below. ? ?   ? ?Recommendations for follow up therapy are one component of a multi-disciplinary discharge planning process, led by the attending physician.  Recommendations may be updated based on patient  status, additional functional criteria and insurance authorization. ? ?Follow Up Recommendations Home health PT ? ?  ?Assistance Recommended at Discharge PRN  ?Patient can return home with the following ? A little help with walking and/or transfers;Help with stairs or ramp for entrance ? ?  ?Equipment Recommendations None recommended by PT  ?Recommendations for Other Services ?    ?  ?Functional Status Assessment    ? ?  ?Precautions / Restrictions Precautions ?Precautions: Fall;Back ?Precaution Booklet Issued: No ?Precaution Comments: recent lumbar back surgery, maintained back precautions ?Restrictions ?Weight Bearing Restrictions: No  ? ?  ? ?Mobility ? Bed Mobility ?Overal bed mobility: Needs Assistance ?Bed Mobility: Rolling, Sidelying to Sit, Sit to Sidelying ?Rolling: Min guard ?Sidelying to sit: Min guard ?  ?  ?Sit to sidelying: Supervision ?  ?  ? ?Transfers ?Overall transfer level: Needs assistance ?Equipment used: Rolling walker (2 wheels) ?Transfers: Sit to/from Stand ?Sit to Stand: Min guard ?  ?  ?  ?  ?  ?General transfer comment: VC for hand placement and to slow down as he initially moves quickly and attempts to pull up the RW ?  ? ?Ambulation/Gait ?  ?Gait Distance (Feet): 200 Feet ?Assistive device: Rolling walker (2 wheels) ?Gait Pattern/deviations: Step-through pattern, Decreased step length - right, Decreased step length - left, Decreased stride length, Trendelenburg, Trunk flexed, Narrow base of support, Steppage ?Gait velocity: decreased ?  ?  ?General Gait Details: Steppage gait on RLE due to fused ankle, narrow BOS ? ?Stairs ?Stairs: Yes ?Stairs assistance: Min guard ?Stair Management: One rail Right, One rail Left ?Number of Stairs: 18 ?General stair comments:  attempted to cue on step to pattern for increased stability, however patient continued to complete steps with alternating pattern.  Pt demonstrates decreased safety awareness with mobility and has to be given verbal cues to  stop. ? ?Wheelchair Mobility ?  ? ?Modified Rankin (Stroke Patients Only) ?  ? ?  ? ?Balance Overall balance assessment: Needs assistance ?Sitting-balance support: No upper extremity supported, Feet supported ?Sitting balance-Leahy Scale: Good ?  ?  ?Standing balance support: Bilateral upper extremity supported ?Standing balance-Leahy Scale: Fair ?Standing balance comment: CGA for mobility with RW, no overt LOB ?  ?  ?  ?  ?  ?  ?  ?  ?  ?  ?  ?   ? ? ? ?Pertinent Vitals/Pain Pain Assessment ?Pain Assessment: 0-10 ?Pain Score: 4  ?Pain Location: 4/10 with ambulation in hallway ?Pain Descriptors / Indicators: Aching ?Pain Intervention(s): Limited activity within patient's tolerance, Monitored during session, Premedicated before session, Repositioned  ? ? ?Home Living Family/patient expects to be discharged to:: Private residence ?Living Arrangements: Spouse/significant other ?Available Help at Discharge: Family;Available 24 hours/day ?Type of Home: Apartment ?Home Access: Stairs to enter ?Entrance Stairs-Rails: Left (wall on right) ?Entrance Stairs-Number of Steps: 18 ?  ?Home Layout: One level ?Home Equipment: Conservation officer, nature (2 wheels);Cane - single point;Crutches;Shower seat;Grab bars - toilet;Grab bars - tub/shower ?   ?  ?Prior Function Prior Level of Function : Independent/Modified Independent ?  ?  ?  ?  ?  ?  ?Mobility Comments: amb with no AD home and community distances but does report having to furniture walk 2/2 pain, has been using RW almost exclusively since back surgery  but was limited in mobility ?ADLs Comments: MOD I-I in ADL prior to surgery, PRN assist after surgery for LB ADL and IADL ?  ? ? ?Hand Dominance  ? Dominant Hand: Right ? ?  ?Extremity/Trunk Assessment  ? Upper Extremity Assessment ?Upper Extremity Assessment: Overall WFL for tasks assessed ?  ? ?Lower Extremity Assessment ?Lower Extremity Assessment: Overall WFL for tasks assessed ?RLE Deficits / Details: R ankle fusion ?  ? ?Cervical  / Trunk Assessment ?Cervical / Trunk Assessment: Back Surgery  ?Communication  ? Communication: No difficulties  ?Cognition Arousal/Alertness: Awake/alert ?Behavior During Therapy: [facility] for tasks assessed/performed ?Overall Cognitive Status: Within Functional Limits for tasks assessed ?  ?  ?  ?  ?  ?  ?  ?  ?  ?  ?  ?  ?  ?  ?  ?  ?General Comments: grossly WFL, impulsive at times requiring VC for safety ?  ?  ? ?  ?General Comments   ? ?  ?Exercises    ? ?Assessment/Plan  ?  ?PT Assessment    ?PT Problem List   ? ?   ?  ?PT Treatment Interventions     ? ?PT Goals (Current goals can be found in the Care Plan section)  ?Acute Rehab PT Goals ?Patient Stated Goal: to go home ?PT Goal Formulation: With patient ?Time For Goal Achievement: 02/02/22 ?Potential to Achieve Goals: Good ? ?  ?Frequency 7X/week ?  ? ? ?Co-evaluation   ?  ?  ?  ?  ? ? ?  ?AM-PAC PT "6 Clicks" Mobility  ?Outcome Measure Help needed turning from your back to your side while in a flat bed without using bedrails?: A Little ?Help needed moving from lying on your back to sitting on the side of a flat bed without using bedrails?: A Little ?Help  needed moving to and from a bed to a chair (including a wheelchair)?: A Little ?Help needed standing up from a chair using your arms (e.g., wheelchair or bedside chair)?: A Little ?Help needed to walk in hospital room?: A Little ?Help needed climbing 3-5 steps with a railing? : A Lot ?6 Click Score: 17 ? ?  ?End of Session   ?Activity Tolerance: Patient tolerated treatment well ?Patient left: in bed;with call bell/phone within reach;with bed alarm set ?Nurse Communication: Mobility status ?PT Visit Diagnosis: Unsteadiness on feet (R26.81);Muscle weakness (generalized) (M62.81);Difficulty in walking, not elsewhere classified (R26.2) ?  ? ?Time: 0923-3007 ?PT Time Calculation (min) (ACUTE ONLY): 37 min ? ? ?Charges:   PT Evaluation ?$PT Eval Low Complexity: 1 Low ?PT Treatments ?$Gait Training: 8-22  mins ?$Therapeutic Activity: 8-22 mins ?  ?   ? ? ?Gwenlyn Saran, PT, DPT ?01/19/22, 1:44 PM ? ? ?Christie Nottingham ?01/19/2022, 1:37 PM ? ?

## 2022-01-19 NOTE — Progress Notes (Signed)
? ?   Attending Progress Note ? ?History: Andrew Walsh is an 81 y/o male s/p L3-S1 decompression on 01/13/22. He was admitted overnight and discharged on 01/14/22. He returned to the ER on 01/18/22 due to worsening bilateral posterior thigh pain and neck pain. MRI showed severe L3-4 and L4-5 stenosis.  ? ?HD1: Pt unsure if symptoms are better as he has not gotten out of bed since transferring from the ED ? ?Physical Exam: ?Vitals:  ? 01/19/22 0314 01/19/22 0821  ?BP: 140/70 (!) 122/92  ?Pulse: (!) 54 69  ?Resp: 20 18  ?Temp: 97.8 ?F (36.6 ?C) 97.6 ?F (36.4 ?C)  ?SpO2: 98% 100%  ? ? ?AA Ox3 ?CNI ? ?Strength:5/5 throughout BLE except 4- in right DF and EHL ? ?Data: ? ?Recent Labs  ?Lab 01/18/22 ?1025  ?NA 135  ?K 4.9  ?CL 106  ?CO2 24  ?BUN 16  ?CREATININE 0.72  ?GLUCOSE 153*  ?CALCIUM 9.4  ? ?Recent Labs  ?Lab 01/18/22 ?1025  ?AST 22  ?ALT 20  ?ALKPHOS 46  ?  ? Recent Labs  ?Lab 01/13/22 ?1144 01/18/22 ?1025  ?WBC 4.7 10.0  ?HGB 12.5* 12.2*  ?HCT 36.7* 35.5*  ?PLT 198 222  ? ?No results for input(s): APTT, INR in the last 168 hours.  ?   ? ? ?Other tests/results:  ?MRI L spine 01/18/22 ?IMPRESSION: ?Postoperative changes from L3-S1 posterior decompression with dorsal ?epidural and subdural fluid collections throughout the lumbar spine ?as detailed above. There are no findings specifically suggestive of ?infection, although infection is not excluded by imaging. The ?collections and degenerative changes result in severe spinal ?stenosis at L3-4, L4-5, and L5-S1 and moderate spinal stenosis at ?L2-3. ?  ?  ?Electronically Signed ?  By: Logan Bores M.D. ?  On: 01/18/2022 16:05 ? ?MRI C spine 01/18/22 ?IMPRESSION: ?1. Moderately advanced cervical disc and facet degeneration with ?mild spinal stenosis at C3-4, C4-5, and C6-7. ?2. Moderate to severe multilevel neural foraminal stenosis, greatest ?at C4-5 and C6-7. ?  ?  ?Electronically Signed ?  By: Logan Bores M.D. ?  On: 01/18/2022 15:19 ? ?Assessment/Plan: ? ?Andrew Walsh a 81 y.o s/p lumbar decompression presenting with ongoing symptoms. MRI shows continued stenosis 2/2 to fluid collection.  ? ?- mobilize ?- pain control ?- PTOT ?- Pt to remain NPO after breakfast (I informed him he can eat as long as he is finished by 9am) ?- Will re-evaluate the need for possible surgical intervention this afternoon  ? ?Cooper Render PA-C ?Department of Neurosurgery ? ?  ?

## 2022-01-20 NOTE — Progress Notes (Signed)
? ?   Attending Progress Note ? ?History: Andrew Walsh is an 81 y/o male s/p L3-S1 decompression on 01/13/22. He was admitted overnight and discharged on 01/14/22. He returned to the ER on 01/18/22 due to worsening bilateral posterior thigh pain and neck pain. MRI showed severe L3-4 and L4-5 stenosis.  ? ?HD2: Reports improved pain and mobility this morning. Pt is eager to work with therapy and hopeful to go home this afternoon. ? ?HD1: Pt unsure if symptoms are better as he has not gotten out of bed since transferring from the ED ? ?Physical Exam: ?Vitals:  ? 01/20/22 0434 01/20/22 0833  ?BP: (!) 158/82 (!) 168/86  ?Pulse: (!) 44 (!) 50  ?Resp: 20 18  ?Temp: 97.7 ?F (36.5 ?C) 98.2 ?F (36.8 ?C)  ?SpO2: 98% 99%  ? ? ?AA Ox3 ?CNI ? ?Strength:5/5 throughout BLE except 4- in right DF and EHL ? ?Data: ? ?Recent Labs  ?Lab 01/18/22 ?1025  ?NA 135  ?K 4.9  ?CL 106  ?CO2 24  ?BUN 16  ?CREATININE 0.72  ?GLUCOSE 153*  ?CALCIUM 9.4  ? ? ?Recent Labs  ?Lab 01/18/22 ?1025  ?AST 22  ?ALT 20  ?ALKPHOS 46  ? ?  ? Recent Labs  ?Lab 01/13/22 ?1144 01/18/22 ?1025  ?WBC 4.7 10.0  ?HGB 12.5* 12.2*  ?HCT 36.7* 35.5*  ?PLT 198 222  ? ? ?No results for input(s): APTT, INR in the last 168 hours.  ?   ? ? ?Other tests/results:  ?MRI L spine 01/18/22 ?IMPRESSION: ?Postoperative changes from L3-S1 posterior decompression with dorsal ?epidural and subdural fluid collections throughout the lumbar spine ?as detailed above. There are no findings specifically suggestive of ?infection, although infection is not excluded by imaging. The ?collections and degenerative changes result in severe spinal ?stenosis at L3-4, L4-5, and L5-S1 and moderate spinal stenosis at ?L2-3. ?  ?  ?Electronically Signed ?  By: Logan Bores M.D. ?  On: 01/18/2022 16:05 ? ?MRI C spine 01/18/22 ?IMPRESSION: ?1. Moderately advanced cervical disc and facet degeneration with ?mild spinal stenosis at C3-4, C4-5, and C6-7. ?2. Moderate to severe multilevel neural foraminal stenosis,  greatest ?at C4-5 and C6-7. ?  ?  ?Electronically Signed ?  By: Logan Bores M.D. ?  On: 01/18/2022 15:19 ? ?Assessment/Plan: ? ?Andrew Walsh a 81 y.o s/p lumbar decompression presenting with ongoing symptoms. MRI shows continued stenosis 2/2 to fluid collection.  ? ?- mobilize ?- pain control ?- PTOT ?- ok for regular diet ?- dispo planning underway ? ?Cooper Render PA-C ?Department of Neurosurgery ? ?  ?

## 2022-01-20 NOTE — Plan of Care (Signed)

## 2022-01-20 NOTE — Discharge Summary (Signed)
Physician Discharge Summary  ?Patient ID: ?Andrew Walsh ?MRN: [id number] ?DOB/AGE: Jun 12, 1941 81 y.o. ? ?Admit date: 01/18/2022 ?Discharge date: 01/20/2022 ? ?Admission Diagnoses: ?Post-op pain and lumbar stenosis  ? ?Discharge Diagnoses:  ?Principal Problem: ?  Lumbar stenosis ? ? ?Discharged Condition: good ? ?Hospital Course:  ?Zakariyah Freimark is a 81 y.o s/p L3-S1 decompression on 01/13/22. He was admitted overnight and discharged on 01/14/22. He returned to the ER on 01/18/22 due to worsening bilateral posterior thigh pain and neck pain. MRI showed severe L3-4 and L4-5 stenosis.  He was admitted for pain control and possible surgical intervention however with medication changes and therapy his symptoms significantly improved and he was able to discharge home on hospital day 2 after being cleared by physical therapy and deemed appropriate for discharge home with home health.  He was given steroid taper and instructed to resume his home pain medications with plans to follow-up as planned for postoperative visits. ? ?Consults: None ? ?Significant Diagnostic Studies:  ?MRI L spine 01/18/22 ?IMPRESSION: ?Postoperative changes from L3-S1 posterior decompression with dorsal ?epidural and subdural fluid collections throughout the lumbar spine ?as detailed above. There are no findings specifically suggestive of ?infection, although infection is not excluded by imaging. The ?collections and degenerative changes result in severe spinal ?stenosis at L3-4, L4-5, and L5-S1 and moderate spinal stenosis at ?L2-3. ?  ?  ?Electronically Signed ?  By: Logan Bores M.D. ?  On: 01/18/2022 16:05 ?  ?MRI C spine 01/18/22 ?IMPRESSION: ?1. Moderately advanced cervical disc and facet degeneration with ?mild spinal stenosis at C3-4, C4-5, and C6-7. ?2. Moderate to severe multilevel neural foraminal stenosis, greatest ?at C4-5 and C6-7. ?  ?  ?Electronically Signed ?  By: Logan Bores M.D. ?  On: 01/18/2022 15:19 ? ?Treatments: Therapy and  medication management ? ?Discharge Exam: ?Blood pressure (!) 168/86, pulse (!) 50, temperature 98.2 ?F (36.8 ?C), resp. rate 18, height '5\' 7"'$  (1.702 m), weight 75 kg, SpO2 99 %. ?AA Ox3 ?CNI ?  ?Strength:5/5 throughout BLE except 4- in right DF and EHL ? ?Disposition: Discharge disposition: 06-Home-Health Care Svc ? ? ? ? ? ? ?Discharge Instructions   ? ? Incentive spirometry RT   Complete by: As directed ?  ? No dressing needed   Complete by: As directed ?  ? ?  ? ?Allergies as of 01/20/2022   ?No Known Allergies ?  ? ?  ?Medication List  ?  ? ?STOP taking these medications   ? ?Constulose 10 GM/15ML solution ?Generic drug: lactulose ?  ?oxyCODONE 5 MG immediate release tablet ?Commonly known as: Oxy IR/ROXICODONE ?  ? ?  ? ?TAKE these medications   ? ?amLODipine 2.5 MG tablet ?Commonly known as: NORVASC ?Take 2.5 mg by mouth daily. ?  ?butalbital-acetaminophen-caffeine 50-325-40 MG tablet ?Commonly known as: FIORICET ?Take 1 tablet by mouth as directed. ?  ?Calcium 600+D3 Plus Minerals 600-800 MG-UNIT Tabs ?Take 2 tablets by mouth daily. ?  ?colesevelam 625 MG tablet ?Commonly known as: WELCHOL ?Take 1,875 mg by mouth 2 (two) times daily with a meal. ?  ?Glucosamine HCl 1000 MG Tabs ?Take 2,000 mg by mouth daily. ?  ?loperamide 2 MG capsule ?Commonly known as: IMODIUM ?Take 4 mg by mouth daily. ?  ?methocarbamol 500 MG tablet ?Commonly known as: ROBAXIN ?Take 1 tablet (500 mg total) by mouth every 6 (six) hours as needed for muscle spasms. ?  ?methylPREDNISolone 4 MG Tbpk tablet ?Commonly known as: MEDROL DOSEPAK ?See admin instructions. ?  ?  naproxen sodium 220 MG tablet ?Commonly known as: ALEVE ?Take 440-660 mg by mouth daily as needed (pain). ?  ?oxyCODONE-acetaminophen 7.5-325 MG tablet ?Commonly known as: PERCOCET ?Take 1 tablet by mouth every 4 (four) hours as needed for pain. ?  ?senna 8.6 MG Tabs tablet ?Commonly known as: SENOKOT ?Take 1 tablet (8.6 mg total) by mouth 2 (two) times daily as needed for mild  constipation. ?  ?simvastatin 20 MG tablet ?Commonly known as: ZOCOR ?Take 20 mg by mouth every other day. ?  ?Vitamin A 2400 MCG (8000 UT) Caps ?Take 2,400 mcg by mouth daily. ?  ?vitamin B-12 500 MCG tablet ?Commonly known as: CYANOCOBALAMIN ?Take 500 mcg by mouth daily. ?  ?vitamin C 1000 MG tablet ?Take 1,000 mg by mouth daily. ?  ? ?  ? ?  ?  ? ? ?  ?Discharge Care Instructions  ?(From admission, onward)  ?  ? ? ?  ? ?  Start     Ordered  ? 01/20/22 0000  No dressing needed       ? 01/20/22 0915  ? ?  ?  ? ?  ? ? Follow-up Information   ? ? Loleta Dicker, PA Follow up.   ?Why: for regularlly schedule post-op follow up ?Contact information: ?[place] [address] ?[phone] ? ? ?  ?  ? ?  ?  ? ?  ? ? ?Signed: ?Loleta Dicker ?01/20/2022, 12:47 PM ? ? ?

## 2022-01-20 NOTE — Progress Notes (Signed)
Physical Therapy Treatment ?Patient Details ?Name: Andrew Walsh ?MRN: [id number] ?DOB: [birth year] ?Today's Date: 01/20/2022 ? ? ?History of Present Illness 81 y/o male s/p L3-S1 decompression on 01/13/22. He was admitted overnight and discharged on 01/14/22. He returned to the ER on 01/18/22 due to worsening bilateral posterior thigh pain and neck pain. MRI showed severe L3-4 and L4-5 stenosis. ? ?  ?PT Comments  ? ? Pt was long sitting in bed upon arriving. He is well know by author from previous admissions and spouses admissions. He endorses no pain throughout entire session and is eager to DC home. Lengthy discussion about safety and his impulsivity. He presents with good strength and balance but due to being slightly impulsive, it at risk for falls. He was able to exit bed, stand, and ambulate with RW without LOB or physical assistance. Vcs for safety reminders throughout session. He demonstrated improved safety with ascending/descending stairs and is cleared from a PT standpoint to safely DC home with HHPT to follow.  ?   ?Recommendations for follow up therapy are one component of a multi-disciplinary discharge planning process, led by the attending physician.  Recommendations may be updated based on patient status, additional functional criteria and insurance authorization. ? ?Follow Up Recommendations ? Home health PT ?  ?  ?Assistance Recommended at Discharge PRN  ?Patient can return home with the following A little help with walking and/or transfers;Help with stairs or ramp for entrance ?  ?Equipment Recommendations ? None recommended by PT  ?  ?Recommendations for Other Services   ? ? ?  ?Precautions / Restrictions Precautions ?Precautions: Fall;Back ?Precaution Booklet Issued: No ?Precaution Comments: recent lumbar back surgery, maintained back precautions ?Restrictions ?Weight Bearing Restrictions: No  ?  ? ?Mobility ? Bed Mobility ?Overal bed mobility: Needs Assistance ?Bed Mobility: Rolling, Sidelying to  Sit, Sit to Sidelying ?Rolling: Supervision ?Sidelying to sit: Supervision ?Supine to sit: Supervision ?Sit to supine: Supervision ?Sit to sidelying: Supervision ?  ?  ? ?Transfers ?Overall transfer level: Needs assistance ?Equipment used: Rolling walker (2 wheels) ?Transfers: Sit to/from Stand ?Sit to Stand: Supervision ?  ?  ?Ambulation/Gait ?Ambulation/Gait assistance: Supervision ?Gait Distance (Feet): 200 Feet ?Assistive device: Rolling walker (2 wheels) ?Gait Pattern/deviations: Step-through pattern ?Gait velocity: decreased ?  ?  ?General Gait Details: pt is slightly impulsive and lacks good safety awareness. He is however stable without LOB. Vcs for slowing down and overall focusing on safety ? ? ?Stairs ?Stairs: Yes ?Stairs assistance: Supervision ?Stair Management: Two rails, Alternating pattern, Forwards ?Number of Stairs: 4 ?General stair comments: pt demonstrated much improved abilities and safety with stair performance. less impulsive with improved safety. ? ? ?  ?Balance Overall balance assessment: Needs assistance ?Sitting-balance support: No upper extremity supported, Feet supported ?Sitting balance-Leahy Scale: Good ?  ?  ?Standing balance support: Bilateral upper extremity supported ?Standing balance-Leahy Scale: Fair ?Standing balance comment: pt is a fall risk moreso due to impulsivity than strength or balance deficits ?  ?   ?Cognition Arousal/Alertness: Awake/alert ?Behavior During Therapy: [facility] for tasks assessed/performed ?Overall Cognitive Status: Within Functional Limits for tasks assessed ?  ?   ?General Comments: pt is A and O x 4 ?  ?  ? ?  ?   ?   ? ?Pertinent Vitals/Pain Pain Assessment ?Pain Assessment: No/denies pain ?Pain Score: 0-No pain ?Pain Intervention(s): Limited activity within patient's tolerance, Monitored during session, Premedicated before session, Repositioned  ? ? ? ?PT Goals (current goals can now be found in the  care plan section) Acute Rehab PT Goals ?Patient Stated  Goal: to go home ?Progress towards PT goals: Progressing toward goals ? ?  ?Frequency ? ? ? 7X/week ? ? ? ?  ?PT Plan Current plan remains appropriate  ? ? ?   ?AM-PAC PT "6 Clicks" Mobility   ?Outcome Measure ? Help needed turning from your back to your side while in a flat bed without using bedrails?: A Little ?Help needed moving from lying on your back to sitting on the side of a flat bed without using bedrails?: A Little ?Help needed moving to and from a bed to a chair (including a wheelchair)?: A Little ?Help needed standing up from a chair using your arms (e.g., wheelchair or bedside chair)?: A Little ?Help needed to walk in hospital room?: A Little ?Help needed climbing 3-5 steps with a railing? : A Little ?6 Click Score: 18 ? ?  ?End of Session   ?Activity Tolerance: Patient tolerated treatment well ?Patient left: in bed;with call bell/phone within reach;with bed alarm set ?Nurse Communication: Mobility status ?PT Visit Diagnosis: Unsteadiness on feet (R26.81);Muscle weakness (generalized) (M62.81);Difficulty in walking, not elsewhere classified (R26.2) ?  ? ? ?Time: 4076-8088 ?PT Time Calculation (min) (ACUTE ONLY): 18 min ? ?Charges:  $Gait Training: 8-22 mins          ?          ? ?Julaine Fusi PTA ?01/20/22, 9:25 AM  ? ?

## 2022-01-20 NOTE — TOC Transition Note (Signed)
Transition of Care (TOC) - CM/SW Discharge Note ? ? ?Patient Details  ?Name: Andrew Walsh ?MRN: [id number] ?Date of Birth: 1941/03/27 ? ?Transition of Care (TOC) CM/SW Contact:  ?Kalai Baca E Shaquil Aldana, LCSW ?Phone Number: ?01/20/2022, 10:51 AM ? ? ?Clinical Narrative:   Patient has orders to DC home today. ?CSW notified [place] with [facility]. ?3in1 ordered by [facility] earlier in admission.  ? ? ? ?Final next level of care: [place] ?Barriers to Discharge: Barriers Resolved ? ? ?Patient Goals and CMS Choice ?  ?  ?  ? ?Discharge Placement ?  ?           ?  ?  ?  ?  ? ?Discharge Plan and Services ?  ?Discharge Planning Services: CM Consult ?           ?DME Arranged: 3-N-1 ?DME Agency: [employer] ?Date DME Agency Contacted: 01/19/22 ?Time DME Agency Contacted: [phone] ?Representative spoke with at DME Agency: intake ?HH Arranged: PT ?[place] Agency: [place] ?Date HH Agency Contacted: 01/20/22 ?Time [place]: [phone] ?Representative spoke with at [place]: [place] ? ?Social Determinants of Health (SDOH) Interventions ?  ? ? ?Readmission Risk Interventions ?   ? View : No data to display.  ?  ?  ?  ? ? ? ? ? ?

## 2022-01-24 ENCOUNTER — Telehealth: Payer: Self-pay | Admitting: Oncology

## 2022-01-24 NOTE — Telephone Encounter (Signed)
Patient called to let us know he had surgery and will need to cancle this appointment. He will see Dr. Tasia Catchings next month. Thank you  ?

## 2022-01-25 ENCOUNTER — Inpatient Hospital Stay: Payer: Medicare HMO

## 2022-02-08 DIAGNOSIS — M48061 Spinal stenosis, lumbar region without neurogenic claudication: Secondary | ICD-10-CM | POA: Diagnosis not present

## 2022-02-08 DIAGNOSIS — C61 Malignant neoplasm of prostate: Secondary | ICD-10-CM | POA: Diagnosis not present

## 2022-02-21 ENCOUNTER — Telehealth: Payer: Self-pay | Admitting: Oncology

## 2022-02-21 NOTE — Telephone Encounter (Signed)
Spoke with pt and infromed him of changes made to appts, Pt confirmed appts.Marland KitchenKJ

## 2022-02-27 ENCOUNTER — Inpatient Hospital Stay: Payer: Medicare HMO | Attending: Oncology

## 2022-02-27 ENCOUNTER — Other Ambulatory Visit: Payer: Self-pay

## 2022-02-27 DIAGNOSIS — Z85828 Personal history of other malignant neoplasm of skin: Secondary | ICD-10-CM | POA: Insufficient documentation

## 2022-02-27 DIAGNOSIS — D7589 Other specified diseases of blood and blood-forming organs: Secondary | ICD-10-CM | POA: Insufficient documentation

## 2022-02-27 DIAGNOSIS — Z79899 Other long term (current) drug therapy: Secondary | ICD-10-CM | POA: Diagnosis not present

## 2022-02-27 DIAGNOSIS — Z8546 Personal history of malignant neoplasm of prostate: Secondary | ICD-10-CM | POA: Diagnosis not present

## 2022-02-27 DIAGNOSIS — Z923 Personal history of irradiation: Secondary | ICD-10-CM | POA: Insufficient documentation

## 2022-02-27 LAB — IRON AND TIBC
Iron: 124 ug/dL (ref 45–182)
Saturation Ratios: 42 % — ABNORMAL HIGH (ref 17.9–39.5)
TIBC: 295 ug/dL (ref 250–450)
UIBC: 171 ug/dL

## 2022-02-27 LAB — CBC WITH DIFFERENTIAL/PLATELET
Abs Immature Granulocytes: 0.07 10*3/uL (ref 0.00–0.07)
Basophils Absolute: 0.1 10*3/uL (ref 0.0–0.1)
Basophils Relative: 1 %
Eosinophils Absolute: 0.1 10*3/uL (ref 0.0–0.5)
Eosinophils Relative: 2 %
HCT: 40.1 % (ref 39.0–52.0)
Hemoglobin: 14.1 g/dL (ref 13.0–17.0)
Immature Granulocytes: 1 %
Lymphocytes Relative: 18 %
Lymphs Abs: 1.1 10*3/uL (ref 0.7–4.0)
MCH: 35.8 pg — ABNORMAL HIGH (ref 26.0–34.0)
MCHC: 35.2 g/dL (ref 30.0–36.0)
MCV: 101.8 fL — ABNORMAL HIGH (ref 80.0–100.0)
Monocytes Absolute: 0.9 10*3/uL (ref 0.1–1.0)
Monocytes Relative: 15 %
Neutro Abs: 3.8 10*3/uL (ref 1.7–7.7)
Neutrophils Relative %: 63 %
Platelets: 242 10*3/uL (ref 150–400)
RBC: 3.94 MIL/uL — ABNORMAL LOW (ref 4.22–5.81)
RDW: 12.9 % (ref 11.5–15.5)
WBC: 6.1 10*3/uL (ref 4.0–10.5)
nRBC: 0 % (ref 0.0–0.2)

## 2022-02-27 LAB — FERRITIN: Ferritin: 292 ng/mL (ref 24–336)

## 2022-03-01 ENCOUNTER — Inpatient Hospital Stay (HOSPITAL_BASED_OUTPATIENT_CLINIC_OR_DEPARTMENT_OTHER): Payer: Medicare HMO | Admitting: Medical Oncology

## 2022-03-01 ENCOUNTER — Inpatient Hospital Stay: Payer: Medicare HMO

## 2022-03-01 DIAGNOSIS — D7589 Other specified diseases of blood and blood-forming organs: Secondary | ICD-10-CM | POA: Diagnosis not present

## 2022-03-01 DIAGNOSIS — Z79899 Other long term (current) drug therapy: Secondary | ICD-10-CM | POA: Diagnosis not present

## 2022-03-01 DIAGNOSIS — Z923 Personal history of irradiation: Secondary | ICD-10-CM | POA: Diagnosis not present

## 2022-03-01 DIAGNOSIS — Z789 Other specified health status: Secondary | ICD-10-CM | POA: Diagnosis not present

## 2022-03-01 DIAGNOSIS — C61 Malignant neoplasm of prostate: Secondary | ICD-10-CM

## 2022-03-01 DIAGNOSIS — Z8546 Personal history of malignant neoplasm of prostate: Secondary | ICD-10-CM | POA: Diagnosis not present

## 2022-03-01 DIAGNOSIS — Z85828 Personal history of other malignant neoplasm of skin: Secondary | ICD-10-CM | POA: Diagnosis not present

## 2022-03-01 MED ORDER — SODIUM CHLORIDE 0.9 % IV SOLN
Freq: Once | INTRAVENOUS | Status: AC
  Filled 2022-03-01: qty 250

## 2022-03-01 NOTE — Progress Notes (Signed)
Removed 500 ml of Blood per phlebotomy orders, Replacement of 500 ml of NS IV. Pt tolerated procedure well. Accepted a beverage. VSS at time of discharge. Accompanied by his wife.

## 2022-03-01 NOTE — Patient Instructions (Signed)

## 2022-03-01 NOTE — Progress Notes (Signed)
Returns for follow-up and therapeutic phlebotomy today. No new concerns.

## 2022-03-01 NOTE — Progress Notes (Signed)
Hematology/Oncology Progress note [phone] [phone]      Patient Care Team: Adin Hector, MD as PCP - General (Internal Medicine) Earlie Server, MD as Consulting Physician (Oncology)  REFERRING PROVIDER: Adin Hector, MD  CHIEF COMPLAINTS/REASON FOR VISIT:  hemochromatosis management  HISTORY OF PRESENTING ILLNESS:  Andrew Walsh is a 81 y.o. male who was seen in consultation at the request of Adin Hector, MD for evaluation of elevated ferritin level/hemochromatosis management. Patient reports a history  hereditary hemochromatosis.  He was diagnosed in 27s by his previous primary care provider in [place] and he has been an active blood donor every 8 to 10 weeks for many years.  Recently he was diagnosed with prostate cancer Gleason score 4+3 with a PSA of 9.4 and underwent IMRT radiation by Dr. Baruch Gouty and he finished in September 2020. He is mildly anemic and also cannot further donate blood due to recent radiation and was referred to me for management of hemochromatosis and iron overload.   Context Shortness of breath: Patient reports mild shortness of breath after exertion recently and he attributes to Covid pandemic and not able to be physically active lately. Fatigue: denies He had blood work done at [facility] primary care provider's office recently. 08/19/2019, hemoglobin 14, hematocrit 40.7, MCV 105.7. Iron 254, ferritin 514,  07/18/2019, iron 259, ferritin 476, transferrin 193.5, TIBC 270, iron saturation 96  Patient was seen by gastroenterology Dr. [name] on 10/07/2020, his hematochezia was considered to be secondary to radiation-induced proctitis with telangiectasia.  Patient had a colonoscopy with [facility] on 08/09/2020. Patient was recommended to use Imodium.  As patient continues to have symptoms, he has discussed with Dr. Ricky Stabs team in early April 2022 and was advised to use Carafate enema twice daily,  INTERVAL  HISTORY Andrew Walsh is a 81 y.o. male who has above history reviewed by me today presents for follow up visit for management of iron overload/hemochromatosis.  Patient reports frustrations in his decreased abilities to ambulate due to recent surgery.  Other than this he reports that he is doing fine.  He has been working with PT and has plans to start PT on his own at the [facility] to help regain his strength.  He denies any muscle cramps, calf pain, chest pain or shortness of breath.   Review of Systems  Constitutional:  Negative for appetite change, chills, fatigue, fever and unexpected weight change.  HENT:   Negative for hearing loss and voice change.   Eyes:  Negative for eye problems and icterus.  Respiratory:  Negative for chest tightness, cough and shortness of breath.   Cardiovascular:  Negative for chest pain and leg swelling.  Gastrointestinal:  Negative for abdominal distention, abdominal pain, blood in stool and diarrhea.  Endocrine: Negative for hot flashes.  Genitourinary:  Negative for difficulty urinating, dysuria and frequency.   Musculoskeletal:  Negative for arthralgias.  Skin:  Negative for itching and rash.  Neurological:  Negative for light-headedness and numbness.  Hematological:  Negative for adenopathy. Does not bruise/bleed easily.  Psychiatric/Behavioral:  Negative for confusion.     MEDICAL HISTORY:  Past Medical History:  Diagnosis Date   Actinic keratosis 01/31/2016   Left lateral neck post auricular. AK and prurigo nodularis   Actinic keratosis 01/31/2016   Left lateral neck post auricular, inferior. AK and prurigo nodularis   Actinic keratosis 12/19/2017   Left ear anti-helix   Ankle arthropathy 02/24/2014   Arthritis of foot,  degenerative 10/03/2012   Overview:  Subtalar arthritis    Basal cell carcinoma 04/30/2008   Left mid dorsum nose.   Basal cell carcinoma 07/12/2009   Left post. deltoid sup. lat. edge of cancer scar.    Cancer ([place])     PROSTATE   Elevated PSA    Hereditary hemochromatosis ([place]) 11/03/2015   Overview:  Treated by regular blood donation    History of kidney stones    HLD (hyperlipidemia) 11/03/2015   Hyperlipidemia    Inguinal hernia    Localized, primary osteoarthritis of ankle or foot 02/24/2014   Osteoarthritis    Squamous cell carcinoma of skin 05/17/2010   Left forearm. KA-like pattern. EDC.   Squamous cell carcinoma of skin 12/18/2016   Right mid lat. volar forearm.WD SCC. EDC   Squamous cell carcinoma of skin 01/22/2019   Right lateral distal tricep near elbow.    Squamous cell carcinoma of skin 07/14/2019   Right forearm. WD SCC. EDC   Squamous cell carcinoma of skin 09/29/2019   Right proximal mandible. SCCis   Squamous cell carcinoma of skin 08/17/2020   Left ear sup helix    SURGICAL HISTORY: Past Surgical History:  Procedure Laterality Date   ANKLE FUSION Right 2001, 2016   BONE MARROW ASPIRATION     COLONOSCOPY WITH PROPOFOL N/A 08/09/2020   Procedure: COLONOSCOPY WITH PROPOFOL;  Surgeon: Toledo, Benay Pike, MD;  Location: [facility] ENDOSCOPY;  Service: Gastroenterology;  Laterality: N/A;   FLEXIBLE SIGMOIDOSCOPY N/A 01/05/2021   Procedure: FLEXIBLE SIGMOIDOSCOPY;  Surgeon: Toledo, Benay Pike, MD;  Location: [facility] ENDOSCOPY;  Service: Gastroenterology;  Laterality: N/A;   FLEXIBLE SIGMOIDOSCOPY N/A 03/23/2021   Procedure: FLEXIBLE SIGMOIDOSCOPY;  Surgeon: Toledo, Benay Pike, MD;  Location: [facility] ENDOSCOPY;  Service: Gastroenterology;  Laterality: N/A;  CRYOTHERAPY PATIENT   FOOT ARTHRODESIS, SUBTALAR     HYDROCELE EXCISION / REPAIR     HYDROCELE EXCISION / REPAIR     JOINT REPLACEMENT Bilateral 02-2004/05-2010   KNEE ARTHROSCOPY     LUMBAR LAMINECTOMY/DECOMPRESSION MICRODISCECTOMY N/A 01/13/2022   Procedure: L3-S1 POSTERIOR SPIAL DECOMPRESSION;  Surgeon: Meade Maw, MD;  Location: [facility] ORS;  Service: Neurosurgery;  Laterality: N/A;   REVISION TOTAL HIP ARTHROPLASTY  2006   Right 2011,  Left 2006    SOCIAL HISTORY: Social History   Socioeconomic History   Marital status: Married    Spouse name: Not on file   Number of children: Not on file   Years of education: Not on file   Highest education level: Not on file  Occupational History   Not on file  Tobacco Use   Smoking status: Former    Years: 20.00    Types: Cigarettes    Quit date: 09/10/1979    Years since quitting: 42.5   Smokeless tobacco: Never  Vaping Use   Vaping Use: Never used  Substance and Sexual Activity   Alcohol use: Yes    Alcohol/week: 32.0 standard drinks of alcohol    Types: 16 Glasses of wine, 16 Standard drinks or equivalent per week   Drug use: Never   Sexual activity: Not Currently  Other Topics Concern   Not on file  Social History Narrative   Live at home with wife.   Social Determinants of Health   Financial Resource Strain: Not on file  Food Insecurity: Not on file  Transportation Needs: Not on file  Physical Activity: Not on file  Stress: Not on file  Social Connections: Not on file  Intimate Partner Violence:  Not on file    FAMILY HISTORY: Family History  Problem Relation Age of Onset   Hemachromatosis Sister    Bladder Cancer Neg Hx    Prostate cancer Neg Hx    Kidney cancer Neg Hx     ALLERGIES:  has No Known Allergies.  MEDICATIONS:  Current Outpatient Medications  Medication Sig Dispense Refill   amLODipine (NORVASC) 2.5 MG tablet Take 2.5 mg by mouth daily.     Ascorbic Acid (VITAMIN C) 1000 MG tablet Take 1,000 mg by mouth daily.     butalbital-acetaminophen-caffeine (FIORICET) 50-325-40 MG tablet Take 1 tablet by mouth as directed.     Calcium Carbonate-Vit D-Min (CALCIUM 600+D3 PLUS MINERALS) 600-800 MG-UNIT TABS Take 2 tablets by mouth daily.     colesevelam (WELCHOL) 625 MG tablet Take 1,875 mg by mouth 2 (two) times daily with a meal.     Glucosamine HCl 1000 MG TABS Take 2,000 mg by mouth daily.     loperamide (IMODIUM) 2 MG capsule Take 4 mg  by mouth daily.     methocarbamol (ROBAXIN) 500 MG tablet Take 1 tablet (500 mg total) by mouth every 6 (six) hours as needed for muscle spasms. 60 tablet 0   methylPREDNISolone (MEDROL DOSEPAK) 4 MG TBPK tablet See admin instructions.     naproxen sodium (ALEVE) 220 MG tablet Take 440-660 mg by mouth daily as needed (pain).     oxyCODONE-acetaminophen (PERCOCET) 7.5-325 MG tablet Take 1 tablet by mouth every 4 (four) hours as needed for pain.     senna (SENOKOT) 8.6 MG TABS tablet Take 1 tablet (8.6 mg total) by mouth 2 (two) times daily as needed for mild constipation. 30 tablet 0   simvastatin (ZOCOR) 20 MG tablet Take 20 mg by mouth every other day.     Vitamin A 2400 MCG (8000 UT) CAPS Take 2,400 mcg by mouth daily.     vitamin B-12 (CYANOCOBALAMIN) 500 MCG tablet Take 500 mcg by mouth daily.     No current facility-administered medications for this visit.     PHYSICAL EXAMINATION: ECOG PERFORMANCE STATUS: 1 - Symptomatic but completely ambulatory Vitals:   03/01/22 1313  BP: 137/84  Pulse: 78  Resp: 16  Temp: 99.5 F (37.5 C)   Filed Weights   03/01/22 1313  Weight: 154 lb (69.9 kg)    Physical Exam Constitutional:      General: He is not in acute distress. HENT:     Head: Normocephalic and atraumatic.  Eyes:     General: No scleral icterus.    Pupils: Pupils are equal, round, and reactive to light.  Cardiovascular:     Rate and Rhythm: Normal rate and regular rhythm.     Heart sounds: Normal heart sounds.  Pulmonary:     Effort: Pulmonary effort is normal. No respiratory distress.     Breath sounds: No wheezing.  Abdominal:     General: Bowel sounds are normal. There is no distension.     Palpations: Abdomen is soft. There is no mass.     Tenderness: There is no abdominal tenderness.  Musculoskeletal:        General: No swelling or deformity. Normal range of motion.     Cervical back: Normal range of motion and neck supple.  Skin:    General: Skin is warm and  dry.     Findings: No erythema or rash.  Neurological:     Mental Status: He is alert and oriented to person, place, and  time. Mental status is at baseline.     Cranial Nerves: No cranial nerve deficit.  Psychiatric:        Mood and Affect: Mood normal.      LABORATORY DATA:  I have reviewed the data as listed Lab Results  Component Value Date   WBC 6.1 02/27/2022   HGB 14.1 02/27/2022   HCT 40.1 02/27/2022   MCV 101.8 (H) 02/27/2022   PLT 242 02/27/2022   Recent Labs    01/02/22 1440 01/13/22 1144 01/18/22 1025  NA 139  --  135  K 4.5  --  4.9  CL 104  --  106  CO2 27  --  24  GLUCOSE 98  --  153*  BUN 11  --  16  CREATININE 0.79 0.92 0.72  CALCIUM 9.2  --  9.4  GFRNONAA >60 >60 >60  PROT  --   --  7.3  ALBUMIN  --   --  4.0  AST  --   --  22  ALT  --   --  20  ALKPHOS  --   --  46  BILITOT  --   --  1.0   Iron/TIBC/Ferritin/ %Sat    Component Value Date/Time   IRON 124 02/27/2022 1138   TIBC 295 02/27/2022 1138   FERRITIN 292 02/27/2022 1138   IRONPCTSAT 42 (H) 02/27/2022 1138      RADIOGRAPHIC STUDIES: I have personally reviewed the radiological images as listed and agreed with the findings in the report.  MR Lumbar Spine W Wo Contrast  Result Date: 01/18/2022 CLINICAL DATA:  Low back pain, prior surgery, new symptoms. Pain radiating down the posterior aspects of both legs. Recent lumbar surgery. EXAM: MRI LUMBAR SPINE WITHOUT AND WITH CONTRAST TECHNIQUE: Multiplanar and multiecho pulse sequences of the lumbar spine were obtained without and with intravenous contrast. CONTRAST:  7.17m GADAVIST GADOBUTROL 1 MMOL/ML IV SOLN COMPARISON:  Intraoperative fluoroscopic images [id number]. CT abdomen and pelvis 04/02/2019. FINDINGS: Segmentation:  Standard. Alignment: Mild lumbar levoscoliosis. Trace anterolisthesis of L4 on L5 and L5 on S1. Vertebrae: Chronic mild T12 and L5 compression fractures, unchanged from 2020. No acute fracture, significant marrow edema, or  evidence of discitis. Nonspecific diffuse bone marrow heterogeneity. Prominent fatty marrow signal at L5 and in the included pelvis likely reflecting the sequelae of prior radiation therapy. Conus medullaris and cauda equina: Conus extends to the T12 level. Conus and cauda equina appear normal. Paraspinal and other soft tissues: Postoperative changes in the posterior lumbar soft tissues. Small subcutaneous fluid collection in the lower lumbar spine along the midline incision measuring up to 2.5 cm in transverse dimension and 5.5 cm craniocaudal. Dorsal epidural (or combination epidural and subdural) fluid collection from L3-S1 measuring up to 1 cm in AP thickness. Small subdural fluid collection dorsally from L1-L3 measuring up to 5 mm in AP thickness. Small dorsal epidural collection from T11-L1 measuring up to 3 mm in AP thickness. Mild dural/epidural enhancement throughout the lumbar spine. Partially visualized bilateral renal cysts with no routine follow-up imaging recommended. Disc levels: Disc desiccation throughout the lumbar and included lower thoracic spine. Advanced disc space narrowing from L1-2 through L3-4, most severe on the right at L2-3 where there is partial interbody ankylosis. Diffuse congenital narrowing of the lumbar spinal canal due to short pedicles. T11-12: Small dorsal epidural collection with a small focus of gas resulting in mild spinal stenosis. Left eccentric disc bulging and moderate facet arthrosis result in severe left neural foraminal  stenosis. T12-L1: Left eccentric disc bulging, small dorsal epidural or subdural fluid collection, and severe facet arthrosis result in mild spinal stenosis and mild bilateral neural foraminal stenosis. L1-2: Disc bulging, endplate spurring, and severe facet hypertrophy result in mild right lateral recess stenosis and mild-to-moderate right and mild left neural foraminal stenosis without significant spinal stenosis. L2-3: Circumferential disc osteophyte  complex, subdural fluid collection, and severe facet hypertrophy result in moderate spinal stenosis and moderate right and mild left neural foraminal stenosis. L3-4: Right laminectomy. Circumferential disc osteophyte complex, dorsal epidural fluid collection, and severe facet hypertrophy result in severe spinal stenosis with complete CSF effacement and moderate bilateral neural foraminal stenosis. L4-5: Laminectomies. Anterolisthesis with bulging uncovered disc, dorsal epidural fluid collection, and severe facet hypertrophy result in severe spinal stenosis with complete CSF effacement and moderate right neural foraminal stenosis. L5-S1: Right laminectomy. Anterolisthesis with bulging uncovered disc, dorsal epidural or subdural fluid collection, and severe facet hypertrophy result in severe spinal stenosis with complete CSF effacement and moderate right and mild left neural foraminal stenosis. IMPRESSION: Postoperative changes from L3-S1 posterior decompression with dorsal epidural and subdural fluid collections throughout the lumbar spine as detailed above. There are no findings specifically suggestive of infection, although infection is not excluded by imaging. The collections and degenerative changes result in severe spinal stenosis at L3-4, L4-5, and L5-S1 and moderate spinal stenosis at L2-3. Electronically Signed   By: Logan Bores M.D.   On: 01/18/2022 16:05   MR Cervical Spine Wo Contrast  Result Date: 01/18/2022 CLINICAL DATA:  Compression fracture, cervical. Pain in the neck and radiating down the posterior aspects of both legs. Recent lumbar surgery. EXAM: MRI CERVICAL SPINE WITHOUT CONTRAST TECHNIQUE: Multiplanar, multisequence MR imaging of the cervical spine was performed. No intravenous contrast was administered. COMPARISON:  None Available. FINDINGS: Alignment: Straightening of the normal cervical lordosis. Slight retrolisthesis of C3 on C4 and slight anterolisthesis of C4 on C5, C7 on T1, and T1  on T2. Vertebrae: No fracture or significant marrow edema. Heterogeneous bone marrow signal diffusely without a suspicious focal lesion identified. Moderate to severe disc space narrowing at C3-4 and C6-7 with associated Modic type 2 endplate changes. Milder disc space narrowing at C4-5 and C5-6. Cord: Normal signal and morphology. Posterior Fossa, vertebral arteries, paraspinal tissues: Unremarkable. Disc levels: C2-3: Mild-to-moderate facet arthrosis result in mild right neural foraminal stenosis without spinal stenosis. C3-4: Broad-based posterior disc osteophyte complex and mild right and moderate left facet arthrosis result in mild spinal stenosis and moderate bilateral neural foraminal stenosis. C4-5: Anterolisthesis with bulging uncovered disc, uncovertebral spurring, and moderate facet arthrosis result in mild spinal stenosis and moderate to severe bilateral neural foraminal stenosis. C5-6: Broad-based posterior disc osteophyte complex and moderate facet arthrosis result in moderate bilateral neural foraminal stenosis without spinal stenosis. C6-7: Broad-based posterior disc osteophyte complex and mild facet arthrosis result in mild spinal stenosis and moderate to severe bilateral neural foraminal stenosis. C7-T1: Anterolisthesis with disc uncovering and mild-to-moderate facet arthrosis without stenosis. IMPRESSION: 1. Moderately advanced cervical disc and facet degeneration with mild spinal stenosis at C3-4, C4-5, and C6-7. 2. Moderate to severe multilevel neural foraminal stenosis, greatest at C4-5 and C6-7. Electronically Signed   By: Logan Bores M.D.   On: 01/18/2022 15:19   DG Lumbar Spine 2-3 Views  Result Date: 01/13/2022 CLINICAL DATA:  Provided history: L3-S1 decompression. EXAM: LUMBAR SPINE - 2-3 VIEW COMPARISON:  CT abdomen/pelvis 04/02/2019. FINDINGS: Eight lateral view intraoperative fluoroscopic images of the lumbosacral spine are  submitted. In correlating with the prior CT abdomen/pelvis  of 04/02/2019, there are 5 lumbar vertebrae and the caudal most well-formed difficult disc space is designated L5-S1. On the provided images, surgical instruments project posterior to the L5-S1, L4-L5 and L3-L4 levels. Correlate with the operative history. IMPRESSION: Eight lateral view intraoperative fluoroscopic images of the lumbosacral spine, as described. Correlate with the operative history. Electronically Signed   By: Kellie Simmering D.O.   On: 01/13/2022 09:29   DG C-Arm 1-60 Min-No Report  Result Date: 01/13/2022 Fluoroscopy was utilized by the requesting physician.  No radiographic interpretation.   DG C-Arm 1-60 Min-No Report  Result Date: 01/13/2022 Fluoroscopy was utilized by the requesting physician.  No radiographic interpretation.     ASSESSMENT & PLAN:  1. Hereditary hemochromatosis ([place])   2. Malignant neoplasm of prostate ([place])   3. Alcohol use   4. Macrocytosis     #History of hereditary hemochromatosis.  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 Labs reviewed and discussed with patient. Hemoglobin is 14.1 today Ferritin increased to 292, iron saturation has reduced to 42% We reviewed the goal of ferritin to be less than 100, preferably less than 50, iron saturation less than 45%. Recommend continue periodic phlebotomy sessions with IV fluid.  He agrees with the plan. Again he is aware that chronic alcohol use/liver toxicity could also falsely raise iron level.  #Chronic alcohol use. Chronic. Stable.  09/09/2021, ultrasound right upper quadrant showed possible mild hepatic steatosis.     #History of prostate cancer, status post radiation. He is followed by Dr. Leda Quail. Erlene Quan.. 04/08/2021 PSA is <0.1.  Continue follow-up with urology in radiation oncology.  #Macrocytosis 1/29/20201 normal vitamin B12 and folate level. This is likely secondary to chronic alcohol use.  Continue vitamin B12 supplementation.   All questions were answered. The patient  knows to call the clinic with any problems questions or concerns.  Phlebo/IVF today Labs/+phleb+IVF in 3 weeks (cbc) Lab/phleb+IVF in 6 weeks (cbc) Labs in 3 months (cbc,iron,tibc,ferr) MD/phleb+IVF 1-2 days after  Hughie Closs PA-C   03/01/2022

## 2022-03-03 DIAGNOSIS — I10 Essential (primary) hypertension: Secondary | ICD-10-CM | POA: Diagnosis not present

## 2022-03-10 DIAGNOSIS — R5383 Other fatigue: Secondary | ICD-10-CM | POA: Diagnosis not present

## 2022-03-10 DIAGNOSIS — R32 Unspecified urinary incontinence: Secondary | ICD-10-CM | POA: Diagnosis not present

## 2022-03-10 DIAGNOSIS — R0602 Shortness of breath: Secondary | ICD-10-CM | POA: Diagnosis not present

## 2022-03-10 DIAGNOSIS — R0609 Other forms of dyspnea: Secondary | ICD-10-CM | POA: Diagnosis not present

## 2022-03-10 DIAGNOSIS — M48061 Spinal stenosis, lumbar region without neurogenic claudication: Secondary | ICD-10-CM | POA: Diagnosis not present

## 2022-03-10 DIAGNOSIS — R0600 Dyspnea, unspecified: Secondary | ICD-10-CM | POA: Diagnosis not present

## 2022-03-10 DIAGNOSIS — E538 Deficiency of other specified B group vitamins: Secondary | ICD-10-CM | POA: Diagnosis not present

## 2022-03-10 DIAGNOSIS — C61 Malignant neoplasm of prostate: Secondary | ICD-10-CM | POA: Diagnosis not present

## 2022-03-22 ENCOUNTER — Inpatient Hospital Stay: Payer: Medicare HMO

## 2022-03-22 ENCOUNTER — Inpatient Hospital Stay: Payer: Medicare HMO | Attending: Oncology

## 2022-03-22 LAB — CBC WITH DIFFERENTIAL/PLATELET
Abs Immature Granulocytes: 0.03 10*3/uL (ref 0.00–0.07)
Basophils Absolute: 0 10*3/uL (ref 0.0–0.1)
Basophils Relative: 1 %
Eosinophils Absolute: 0.1 10*3/uL (ref 0.0–0.5)
Eosinophils Relative: 2 %
HCT: 38.4 % — ABNORMAL LOW (ref 39.0–52.0)
Hemoglobin: 13.2 g/dL (ref 13.0–17.0)
Immature Granulocytes: 1 %
Lymphocytes Relative: 23 %
Lymphs Abs: 1.3 10*3/uL (ref 0.7–4.0)
MCH: 36.2 pg — ABNORMAL HIGH (ref 26.0–34.0)
MCHC: 34.4 g/dL (ref 30.0–36.0)
MCV: 105.2 fL — ABNORMAL HIGH (ref 80.0–100.0)
Monocytes Absolute: 0.6 10*3/uL (ref 0.1–1.0)
Monocytes Relative: 10 %
Neutro Abs: 3.7 10*3/uL (ref 1.7–7.7)
Neutrophils Relative %: 63 %
Platelets: 256 10*3/uL (ref 150–400)
RBC: 3.65 MIL/uL — ABNORMAL LOW (ref 4.22–5.81)
RDW: 13.3 % (ref 11.5–15.5)
WBC: 5.8 10*3/uL (ref 4.0–10.5)
nRBC: 0 % (ref 0.0–0.2)

## 2022-03-22 MED ORDER — SODIUM CHLORIDE 0.9 % IV SOLN
Freq: Once | INTRAVENOUS | Status: AC
  Filled 2022-03-22: qty 250

## 2022-03-22 NOTE — Progress Notes (Signed)
Pt had cbc drawn today. Hgb > 12 so RN initiated phlebotomy removing 500 ml of blood. Hung 500 ml NS to replace volume removed. Pt tolerated procedure well.

## 2022-03-22 NOTE — Patient Instructions (Signed)

## 2022-03-23 DIAGNOSIS — R0609 Other forms of dyspnea: Secondary | ICD-10-CM | POA: Diagnosis not present

## 2022-03-31 DIAGNOSIS — C61 Malignant neoplasm of prostate: Secondary | ICD-10-CM | POA: Diagnosis not present

## 2022-03-31 DIAGNOSIS — M48061 Spinal stenosis, lumbar region without neurogenic claudication: Secondary | ICD-10-CM | POA: Diagnosis not present

## 2022-03-31 DIAGNOSIS — E785 Hyperlipidemia, unspecified: Secondary | ICD-10-CM | POA: Diagnosis not present

## 2022-04-11 ENCOUNTER — Other Ambulatory Visit: Payer: Self-pay

## 2022-04-12 ENCOUNTER — Inpatient Hospital Stay: Payer: Medicare HMO

## 2022-04-13 ENCOUNTER — Other Ambulatory Visit: Payer: Self-pay

## 2022-04-19 ENCOUNTER — Ambulatory Visit: Payer: Self-pay | Admitting: Urology

## 2022-05-02 ENCOUNTER — Ambulatory Visit (INDEPENDENT_AMBULATORY_CARE_PROVIDER_SITE_OTHER): Payer: Medicare HMO | Admitting: Neurosurgery

## 2022-05-02 ENCOUNTER — Encounter: Payer: Self-pay | Admitting: Neurosurgery

## 2022-05-02 VITALS — BP 144/78 | Ht 67.0 in | Wt 154.0 lb

## 2022-05-02 DIAGNOSIS — Z09 Encounter for follow-up examination after completed treatment for conditions other than malignant neoplasm: Secondary | ICD-10-CM

## 2022-05-02 DIAGNOSIS — M48062 Spinal stenosis, lumbar region with neurogenic claudication: Secondary | ICD-10-CM

## 2022-05-02 NOTE — Progress Notes (Signed)
   DOS: L3-S1 posterior spinal decompression on 01/13/22   HISTORY OF PRESENT ILLNESS:  05/02/2022 Andrew Walsh returns to see me.  He is making forward progress.  He is doing extensive exercises at home.  He is now able to walk with walking sticks.  He continues to have issues with his balance.  He has no leg pain that is significant to report.  He has no numbness or tingling. 02/23/2022 Andrew Walsh is status post lumbar decompression. His leg pain is much improved compared to before surgery. Unfortunate, he has been dealing with significant fatigue. He can only walk about 100 m before he has to sit down due to fatigue. He has some shortness of breath as well. He is concerned about this as he was previously in excellent physical condition.  PHYSICAL EXAMINATION:  There were no vitals filed for this visit. General: Patient is well developed, well nourished, calm, collected, and in no apparent distress.  NEUROLOGICAL:  General: In no acute distress.  Awake, alert, oriented to person, place, and time. Pupils equal round and reactive to light.   Strength:  Side Iliopsoas Quads Hamstring PF DF EHL  R '5 5 5 5 5 5  '$ L '5 5 5 5 5 5   '$ Incision c/d/i   ROS (Neurologic): Negative except as noted above  IMAGING: IMAGING: MRI L spine 01/18/22 IMPRESSION:  Postoperative changes from L3-S1 posterior decompression with dorsal  epidural and subdural fluid collections throughout the lumbar spine  as detailed above. There are no findings specifically suggestive of  infection, although infection is not excluded by imaging. The  collections and degenerative changes result in severe spinal  stenosis at L3-4, L4-5, and L5-S1 and moderate spinal stenosis at  L2-3.   Electronically Signed  By: Logan Bores M.D.  On: 01/18/2022 16:05  MRI C spine 01/18/22 IMPRESSION:  1. Moderately advanced cervical disc and facet degeneration with  mild spinal stenosis at C3-4, C4-5, and C6-7.  2. Moderate to  severe multilevel neural foraminal stenosis, greatest  at C4-5 and C6-7.   Electronically Signed  By: Logan Bores M.D.  On: 01/18/2022 15:19  ASSESSMENT/PLAN:  Andrew Walsh is doing better after lumbar decompression.   He continues to do exercises for his balance and walking.  I would like to see him back in 3 months.  He does not have any sensory or myelopathic symptoms other than imbalance, so I have not recommended thoracic spine imaging at this time.  His fatigue has substantially improved.  Meade Maw MD, [facility] Department of Neurosurgery

## 2022-06-02 ENCOUNTER — Other Ambulatory Visit: Payer: Self-pay

## 2022-06-06 ENCOUNTER — Encounter: Payer: Self-pay | Admitting: Oncology

## 2022-06-06 ENCOUNTER — Inpatient Hospital Stay: Payer: Medicare HMO | Attending: Oncology

## 2022-06-06 ENCOUNTER — Inpatient Hospital Stay: Payer: Medicare HMO

## 2022-06-06 ENCOUNTER — Inpatient Hospital Stay: Payer: Medicare HMO | Admitting: Oncology

## 2022-06-06 DIAGNOSIS — D649 Anemia, unspecified: Secondary | ICD-10-CM | POA: Diagnosis not present

## 2022-06-06 DIAGNOSIS — Z789 Other specified health status: Secondary | ICD-10-CM | POA: Diagnosis not present

## 2022-06-06 DIAGNOSIS — Z923 Personal history of irradiation: Secondary | ICD-10-CM | POA: Diagnosis not present

## 2022-06-06 DIAGNOSIS — Z8546 Personal history of malignant neoplasm of prostate: Secondary | ICD-10-CM | POA: Diagnosis not present

## 2022-06-06 DIAGNOSIS — F1090 Alcohol use, unspecified, uncomplicated: Secondary | ICD-10-CM | POA: Diagnosis not present

## 2022-06-06 DIAGNOSIS — C61 Malignant neoplasm of prostate: Secondary | ICD-10-CM | POA: Diagnosis not present

## 2022-06-06 LAB — CBC WITH DIFFERENTIAL/PLATELET
Abs Immature Granulocytes: 0.02 10*3/uL (ref 0.00–0.07)
Basophils Absolute: 0 10*3/uL (ref 0.0–0.1)
Basophils Relative: 0 %
Eosinophils Absolute: 0.1 10*3/uL (ref 0.0–0.5)
Eosinophils Relative: 2 %
HCT: 40.7 % (ref 39.0–52.0)
Hemoglobin: 14.3 g/dL (ref 13.0–17.0)
Immature Granulocytes: 0 %
Lymphocytes Relative: 23 %
Lymphs Abs: 1.2 10*3/uL (ref 0.7–4.0)
MCH: 35.8 pg — ABNORMAL HIGH (ref 26.0–34.0)
MCHC: 35.1 g/dL (ref 30.0–36.0)
MCV: 101.8 fL — ABNORMAL HIGH (ref 80.0–100.0)
Monocytes Absolute: 0.5 10*3/uL (ref 0.1–1.0)
Monocytes Relative: 10 %
Neutro Abs: 3.3 10*3/uL (ref 1.7–7.7)
Neutrophils Relative %: 65 %
Platelets: 206 10*3/uL (ref 150–400)
RBC: 4 MIL/uL — ABNORMAL LOW (ref 4.22–5.81)
RDW: 12 % (ref 11.5–15.5)
WBC: 5.2 10*3/uL (ref 4.0–10.5)
nRBC: 0 % (ref 0.0–0.2)

## 2022-06-06 LAB — IRON AND TIBC
Iron: 237 ug/dL — ABNORMAL HIGH (ref 45–182)
Saturation Ratios: 80 % — ABNORMAL HIGH (ref 17.9–39.5)
TIBC: 295 ug/dL (ref 250–450)
UIBC: 58 ug/dL

## 2022-06-06 LAB — FERRITIN: Ferritin: 121 ng/mL (ref 24–336)

## 2022-06-06 NOTE — Progress Notes (Signed)
No phlebotomy required today per Dr Yu. °

## 2022-06-06 NOTE — Assessment & Plan Note (Signed)
Last ultrasound in 2022 showed no signs of liver cirrhosis. Recommend alcohol cessation. 

## 2022-06-06 NOTE — Assessment & Plan Note (Addendum)
status post radiation.  04/08/2021 PSA is <0.1.  Recommend patient to continue follow-up with urology in radiation oncology Check PSA at next visit.

## 2022-06-06 NOTE — Assessment & Plan Note (Signed)
#  History of hereditary hemochromatosis.  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 We reviewed the goal of ferritin to be less than 100, preferably less than 50, iron saturation less than 45%. Iron labs are available after patient's visit.  Ferritin 121, iron saturation 89. Recommend continue periodic phlebotomy sessions with IV fluid.

## 2022-06-06 NOTE — Progress Notes (Signed)
Hematology/Oncology Progress note [phone] [phone]      Patient Care Team: Adin Hector, MD as PCP - General (Internal Medicine) Earlie Server, MD as Consulting Physician (Oncology)  ASSESSMENT & PLAN:   Hereditary hemochromatosis [facility]) #History of hereditary hemochromatosis.  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 We reviewed the goal of ferritin to be less than 100, preferably less than 50, iron saturation less than 45%. Iron labs are available after patient's visit.  Ferritin 121, iron saturation 89. Recommend continue periodic phlebotomy sessions with IV fluid.    Alcohol use Last ultrasound in 2022 showed no signs of liver cirrhosis. Recommend alcohol cessation.  History of prostate cancer status post radiation.  04/08/2021 PSA is <0.1.  Recommend patient to continue follow-up with urology in radiation oncology Check PSA at next visit.   Orders Placed This Encounter  Procedures   PSA    Standing Status:   Future    Standing Expiration Date:   06/07/2023   Ferritin    Standing Status:   Future    Standing Expiration Date:   12/05/2022   Iron and TIBC    Standing Status:   Future    Standing Expiration Date:   06/07/2023   CBC with Differential/Platelet    Standing Status:   Future    Standing Expiration Date:   06/07/2023   Comprehensive metabolic panel    Standing Status:   Future    Standing Expiration Date:   06/07/2023   Follow up  Phlebo/IVF this week Labs/+phleb+IVF in 4 weeks (cbc) Lab/phleb+IVF in 8 weeks (cbc) Lab/phleb+IVF in 12 weeks (cbc) Labs in 4 months prior to  MD/phleb+IVF   All questions were answered. The patient knows to call the clinic with any problems, questions or concerns.  Earlie Server, MD, PhD [facility] Hematology Oncology 06/06/2022    CHIEF COMPLAINTS/REASON FOR VISIT:  hemochromatosis management  HISTORY OF PRESENTING ILLNESS:  Andrew Walsh is a 81 y.o. male who was seen in  consultation at the request of Adin Hector, MD for evaluation of elevated ferritin level/hemochromatosis management. Patient reports a history  hereditary hemochromatosis.  He was diagnosed in 70s by his previous primary care provider in [place] and he has been an active blood donor every 8 to 10 weeks for many years.  Recently he was diagnosed with prostate cancer Gleason score 4+3 with a PSA of 9.4 and underwent IMRT radiation by Dr. Baruch Gouty and he finished in September 2020. He is mildly anemic and also cannot further donate blood due to recent radiation and was referred to me for management of hemochromatosis and iron overload.   Context Shortness of breath: Patient reports mild shortness of breath after exertion recently and he attributes to Covid pandemic and not able to be physically active lately. Fatigue: denies He had blood work done at [facility] primary care provider's office recently. 08/19/2019, hemoglobin 14, hematocrit 40.7, MCV 105.7. Iron 254, ferritin 514,  07/18/2019, iron 259, ferritin 476, transferrin 193.5, TIBC 270, iron saturation 96  Patient was seen by gastroenterology Dr. [name] on 10/07/2020, his hematochezia was considered to be secondary to radiation-induced proctitis with telangiectasia.  Patient had a colonoscopy with APC on 08/09/2020. Patient was recommended to use Imodium.  As patient continues to have symptoms, he has discussed with Dr. Ricky Stabs team in early April 2022 and was advised to use Carafate enema twice daily,  INTERVAL HISTORY Andrew Walsh is a 81 y.o. male who  has above history reviewed by me today presents for follow up visit for management of iron overload/hemochromatosis. During the interval he had lumbar laminectomy. Patient reports lower extremity weakness  since the surgery.  Mobility is slowly improving.  He walks with ski poles   Review of Systems  Constitutional:  Negative for appetite change, chills, fatigue, fever and  unexpected weight change.  HENT:   Negative for hearing loss and voice change.   Eyes:  Negative for eye problems and icterus.  Respiratory:  Negative for chest tightness, cough and shortness of breath.   Cardiovascular:  Negative for chest pain and leg swelling.  Gastrointestinal:  Negative for abdominal distention, abdominal pain, blood in stool and diarrhea.  Endocrine: Negative for hot flashes.  Genitourinary:  Negative for difficulty urinating, dysuria and frequency.   Musculoskeletal:  Negative for arthralgias.       Lower extremity weakness.   Skin:  Negative for itching and rash.  Neurological:  Negative for light-headedness and numbness.  Hematological:  Negative for adenopathy. Does not bruise/bleed easily.  Psychiatric/Behavioral:  Negative for confusion.     MEDICAL HISTORY:  Past Medical History:  Diagnosis Date   Actinic keratosis 01/31/2016   Left lateral neck post auricular. AK and prurigo nodularis   Actinic keratosis 01/31/2016   Left lateral neck post auricular, inferior. AK and prurigo nodularis   Actinic keratosis 12/19/2017   Left ear anti-helix   Ankle arthropathy 02/24/2014   Arthritis of foot, degenerative 10/03/2012   Overview:  Subtalar arthritis    Basal cell carcinoma 04/30/2008   Left mid dorsum nose.   Basal cell carcinoma 07/12/2009   Left post. deltoid sup. lat. edge of cancer scar.    Cancer ([place])    PROSTATE   Elevated PSA    Hereditary hemochromatosis ([place]) 11/03/2015   Overview:  Treated by regular blood donation    History of kidney stones    HLD (hyperlipidemia) 11/03/2015   Hyperlipidemia    Inguinal hernia    Localized, primary osteoarthritis of ankle or foot 02/24/2014   Osteoarthritis    Squamous cell carcinoma of skin 05/17/2010   Left forearm. KA-like pattern. EDC.   Squamous cell carcinoma of skin 12/18/2016   Right mid lat. volar forearm.WD SCC. EDC   Squamous cell carcinoma of skin 01/22/2019   Right lateral distal tricep near  elbow.    Squamous cell carcinoma of skin 07/14/2019   Right forearm. WD SCC. EDC   Squamous cell carcinoma of skin 09/29/2019   Right proximal mandible. SCCis   Squamous cell carcinoma of skin 08/17/2020   Left ear sup helix    SURGICAL HISTORY: Past Surgical History:  Procedure Laterality Date   ANKLE FUSION Right 2001, 2016   BONE MARROW ASPIRATION     COLONOSCOPY WITH PROPOFOL N/A 08/09/2020   Procedure: COLONOSCOPY WITH PROPOFOL;  Surgeon: Toledo, Benay Pike, MD;  Location: [facility] ENDOSCOPY;  Service: Gastroenterology;  Laterality: N/A;   FLEXIBLE SIGMOIDOSCOPY N/A 01/05/2021   Procedure: FLEXIBLE SIGMOIDOSCOPY;  Surgeon: Toledo, Benay Pike, MD;  Location: [facility] ENDOSCOPY;  Service: Gastroenterology;  Laterality: N/A;   FLEXIBLE SIGMOIDOSCOPY N/A 03/23/2021   Procedure: FLEXIBLE SIGMOIDOSCOPY;  Surgeon: Toledo, Benay Pike, MD;  Location: [facility] ENDOSCOPY;  Service: Gastroenterology;  Laterality: N/A;  CRYOTHERAPY PATIENT   FOOT ARTHRODESIS, SUBTALAR     HYDROCELE EXCISION / REPAIR     HYDROCELE EXCISION / REPAIR     JOINT REPLACEMENT Bilateral 02-2004/05-2010   KNEE ARTHROSCOPY     LUMBAR LAMINECTOMY/DECOMPRESSION  MICRODISCECTOMY N/A 01/13/2022   Procedure: L3-S1 POSTERIOR SPIAL DECOMPRESSION;  Surgeon: Meade Maw, MD;  Location: [facility] ORS;  Service: Neurosurgery;  Laterality: N/A;   REVISION TOTAL HIP ARTHROPLASTY  2006   Right 2011, Left 2006    SOCIAL HISTORY: Social History   Socioeconomic History   Marital status: Married    Spouse name: Not on file   Number of children: Not on file   Years of education: Not on file   Highest education level: Not on file  Occupational History   Not on file  Tobacco Use   Smoking status: Former    Years: 20.00    Types: Cigarettes    Quit date: 09/10/1979    Years since quitting: 42.7   Smokeless tobacco: Never  Vaping Use   Vaping Use: Never used  Substance and Sexual Activity   Alcohol use: Yes    Alcohol/week: 32.0 standard  drinks of alcohol    Types: 16 Glasses of wine, 16 Standard drinks or equivalent per week   Drug use: Never   Sexual activity: Not Currently  Other Topics Concern   Not on file  Social History Narrative   Live at home with wife.   Social Determinants of Health   Financial Resource Strain: Not on file  Food Insecurity: Not on file  Transportation Needs: Not on file  Physical Activity: Not on file  Stress: Not on file  Social Connections: Not on file  Intimate Partner Violence: Not on file    FAMILY HISTORY: Family History  Problem Relation Age of Onset   Hemachromatosis Sister    Bladder Cancer Neg Hx    Prostate cancer Neg Hx    Kidney cancer Neg Hx     ALLERGIES:  has No Known Allergies.  MEDICATIONS:  Current Outpatient Medications  Medication Sig Dispense Refill   Ascorbic Acid (VITAMIN C) 1000 MG tablet Take 1,000 mg by mouth daily.     Calcium Carbonate-Vit D-Min (CALCIUM 600+D3 PLUS MINERALS) 600-800 MG-UNIT TABS Take 2 tablets by mouth daily.     Glucosamine HCl 1000 MG TABS Take 2,000 mg by mouth daily.     loperamide (IMODIUM) 2 MG capsule Take 4 mg by mouth daily.     simvastatin (ZOCOR) 20 MG tablet Take 20 mg by mouth every other day.     Vitamin A 2400 MCG (8000 UT) CAPS Take 2,400 mcg by mouth daily.     vitamin B-12 (CYANOCOBALAMIN) 500 MCG tablet Take 500 mcg by mouth daily.     No current facility-administered medications for this visit.     PHYSICAL EXAMINATION: ECOG PERFORMANCE STATUS: 1 - Symptomatic but completely ambulatory Vitals:   06/06/22 1319  BP: (!) 159/83  Pulse: 79  Resp: 18  Temp: 98.8 F (37.1 C)   Filed Weights   06/06/22 1319  Weight: 161 lb 14.4 oz (73.4 kg)    Physical Exam Constitutional:      General: He is not in acute distress. HENT:     Head: Normocephalic and atraumatic.  Eyes:     General: No scleral icterus.    Pupils: Pupils are equal, round, and reactive to light.  Cardiovascular:     Rate and Rhythm:  Normal rate and regular rhythm.     Heart sounds: Normal heart sounds.  Pulmonary:     Effort: Pulmonary effort is normal. No respiratory distress.     Breath sounds: No wheezing.  Abdominal:     General: Bowel sounds are normal. There  is no distension.     Palpations: Abdomen is soft. There is no mass.     Tenderness: There is no abdominal tenderness.  Musculoskeletal:        General: No swelling or deformity. Normal range of motion.     Cervical back: Normal range of motion and neck supple.  Skin:    General: Skin is warm and dry.     Findings: No erythema or rash.  Neurological:     Mental Status: He is alert and oriented to person, place, and time. Mental status is at baseline.     Cranial Nerves: No cranial nerve deficit.  Psychiatric:        Mood and Affect: Mood normal.      LABORATORY DATA:  I have reviewed the data as listed Lab Results  Component Value Date   WBC 5.2 06/06/2022   HGB 14.3 06/06/2022   HCT 40.7 06/06/2022   MCV 101.8 (H) 06/06/2022   PLT 206 06/06/2022   Recent Labs    01/02/22 1440 01/13/22 1144 01/18/22 1025  NA 139  --  135  K 4.5  --  4.9  CL 104  --  106  CO2 27  --  24  GLUCOSE 98  --  153*  BUN 11  --  16  CREATININE 0.79 0.92 0.72  CALCIUM 9.2  --  9.4  GFRNONAA >60 >60 >60  PROT  --   --  7.3  ALBUMIN  --   --  4.0  AST  --   --  22  ALT  --   --  20  ALKPHOS  --   --  46  BILITOT  --   --  1.0    Iron/TIBC/Ferritin/ %Sat    Component Value Date/Time   IRON 237 (H) 06/06/2022 1303   TIBC 295 06/06/2022 1303   FERRITIN 121 06/06/2022 1303   IRONPCTSAT 80 (H) 06/06/2022 1303      RADIOGRAPHIC STUDIES: I have personally reviewed the radiological images as listed and agreed with the findings in the report.  No results found.

## 2022-06-07 ENCOUNTER — Ambulatory Visit: Payer: Medicare HMO

## 2022-06-07 ENCOUNTER — Ambulatory Visit: Payer: Medicare HMO | Admitting: Oncology

## 2022-06-07 ENCOUNTER — Other Ambulatory Visit: Payer: Medicare HMO

## 2022-06-07 ENCOUNTER — Other Ambulatory Visit: Payer: Self-pay

## 2022-06-07 ENCOUNTER — Telehealth: Payer: Self-pay

## 2022-06-07 NOTE — Telephone Encounter (Signed)
-----   Message from Earlie Server, MD sent at 06/06/2022 10:17 PM EDT ----- LOS : Phlebo/IVF this week Labs/+phleb+IVF in 4 weeks (cbc) Lab/phleb+IVF in 8 weeks (cbc) Lab/phleb+IVF in 12 weeks (cbc) Labs in 4 months prior to  MD/phleb+IVF

## 2022-06-07 NOTE — Telephone Encounter (Signed)
Please schedule and inform pt of appts.

## 2022-06-09 ENCOUNTER — Inpatient Hospital Stay: Payer: Medicare HMO

## 2022-06-26 DIAGNOSIS — K627 Radiation proctitis: Secondary | ICD-10-CM | POA: Diagnosis not present

## 2022-06-26 DIAGNOSIS — E785 Hyperlipidemia, unspecified: Secondary | ICD-10-CM | POA: Diagnosis not present

## 2022-06-26 DIAGNOSIS — I10 Essential (primary) hypertension: Secondary | ICD-10-CM | POA: Diagnosis not present

## 2022-06-27 ENCOUNTER — Emergency Department: Payer: Medicare HMO

## 2022-06-27 ENCOUNTER — Encounter: Payer: Self-pay | Admitting: Emergency Medicine

## 2022-06-27 ENCOUNTER — Other Ambulatory Visit: Payer: Self-pay

## 2022-06-27 ENCOUNTER — Inpatient Hospital Stay: Payer: Medicare HMO

## 2022-06-27 ENCOUNTER — Inpatient Hospital Stay
Admission: EM | Admit: 2022-06-27 | Discharge: 2022-07-04 | DRG: 536 | Disposition: A | Discharge status: Skilled Nursing Facility | Payer: Medicare HMO | Attending: Internal Medicine | Admitting: Internal Medicine

## 2022-06-27 DIAGNOSIS — Z8546 Personal history of malignant neoplasm of prostate: Secondary | ICD-10-CM

## 2022-06-27 DIAGNOSIS — Z85828 Personal history of other malignant neoplasm of skin: Secondary | ICD-10-CM

## 2022-06-27 DIAGNOSIS — W19XXXD Unspecified fall, subsequent encounter: Secondary | ICD-10-CM | POA: Diagnosis not present

## 2022-06-27 DIAGNOSIS — Z79899 Other long term (current) drug therapy: Secondary | ICD-10-CM | POA: Diagnosis not present

## 2022-06-27 DIAGNOSIS — W19XXXA Unspecified fall, initial encounter: Secondary | ICD-10-CM | POA: Diagnosis not present

## 2022-06-27 DIAGNOSIS — S72115D Nondisplaced fracture of greater trochanter of left femur, subsequent encounter for closed fracture with routine healing: Secondary | ICD-10-CM | POA: Diagnosis not present

## 2022-06-27 DIAGNOSIS — Z96641 Presence of right artificial hip joint: Secondary | ICD-10-CM | POA: Diagnosis present

## 2022-06-27 DIAGNOSIS — E785 Hyperlipidemia, unspecified: Secondary | ICD-10-CM | POA: Diagnosis present

## 2022-06-27 DIAGNOSIS — M25552 Pain in left hip: Secondary | ICD-10-CM | POA: Diagnosis not present

## 2022-06-27 DIAGNOSIS — Y9301 Activity, walking, marching and hiking: Secondary | ICD-10-CM | POA: Diagnosis present

## 2022-06-27 DIAGNOSIS — R262 Difficulty in walking, not elsewhere classified: Secondary | ICD-10-CM | POA: Diagnosis present

## 2022-06-27 DIAGNOSIS — M48061 Spinal stenosis, lumbar region without neurogenic claudication: Secondary | ICD-10-CM | POA: Diagnosis not present

## 2022-06-27 DIAGNOSIS — D539 Nutritional anemia, unspecified: Secondary | ICD-10-CM | POA: Diagnosis present

## 2022-06-27 DIAGNOSIS — Z7901 Long term (current) use of anticoagulants: Secondary | ICD-10-CM | POA: Diagnosis not present

## 2022-06-27 DIAGNOSIS — M549 Dorsalgia, unspecified: Secondary | ICD-10-CM | POA: Diagnosis present

## 2022-06-27 DIAGNOSIS — R600 Localized edema: Secondary | ICD-10-CM | POA: Diagnosis not present

## 2022-06-27 DIAGNOSIS — S72115A Nondisplaced fracture of greater trochanter of left femur, initial encounter for closed fracture: Principal | ICD-10-CM | POA: Diagnosis present

## 2022-06-27 DIAGNOSIS — W010XXA Fall on same level from slipping, tripping and stumbling without subsequent striking against object, initial encounter: Secondary | ICD-10-CM | POA: Diagnosis present

## 2022-06-27 DIAGNOSIS — Z87891 Personal history of nicotine dependence: Secondary | ICD-10-CM | POA: Diagnosis not present

## 2022-06-27 DIAGNOSIS — Z981 Arthrodesis status: Secondary | ICD-10-CM

## 2022-06-27 DIAGNOSIS — Z96649 Presence of unspecified artificial hip joint: Principal | ICD-10-CM

## 2022-06-27 DIAGNOSIS — M978XXA Periprosthetic fracture around other internal prosthetic joint, initial encounter: Principal | ICD-10-CM

## 2022-06-27 DIAGNOSIS — Z87442 Personal history of urinary calculi: Secondary | ICD-10-CM

## 2022-06-27 DIAGNOSIS — E782 Mixed hyperlipidemia: Secondary | ICD-10-CM

## 2022-06-27 DIAGNOSIS — E538 Deficiency of other specified B group vitamins: Secondary | ICD-10-CM | POA: Diagnosis not present

## 2022-06-27 DIAGNOSIS — G8929 Other chronic pain: Secondary | ICD-10-CM | POA: Diagnosis present

## 2022-06-27 DIAGNOSIS — Z8781 Personal history of (healed) traumatic fracture: Secondary | ICD-10-CM | POA: Diagnosis not present

## 2022-06-27 DIAGNOSIS — S72002A Fracture of unspecified part of neck of left femur, initial encounter for closed fracture: Secondary | ICD-10-CM | POA: Diagnosis not present

## 2022-06-27 DIAGNOSIS — Z96642 Presence of left artificial hip joint: Secondary | ICD-10-CM | POA: Diagnosis not present

## 2022-06-27 DIAGNOSIS — R9431 Abnormal electrocardiogram [ECG] [EKG]: Secondary | ICD-10-CM | POA: Diagnosis not present

## 2022-06-27 DIAGNOSIS — E7800 Pure hypercholesterolemia, unspecified: Secondary | ICD-10-CM | POA: Diagnosis not present

## 2022-06-27 DIAGNOSIS — S7292XA Unspecified fracture of left femur, initial encounter for closed fracture: Secondary | ICD-10-CM | POA: Diagnosis not present

## 2022-06-27 DIAGNOSIS — S7222XA Displaced subtrochanteric fracture of left femur, initial encounter for closed fracture: Secondary | ICD-10-CM | POA: Diagnosis not present

## 2022-06-27 DIAGNOSIS — M1990 Unspecified osteoarthritis, unspecified site: Secondary | ICD-10-CM | POA: Diagnosis not present

## 2022-06-27 DIAGNOSIS — M9702XA Periprosthetic fracture around internal prosthetic left hip joint, initial encounter: Secondary | ICD-10-CM | POA: Diagnosis not present

## 2022-06-27 DIAGNOSIS — I10 Essential (primary) hypertension: Secondary | ICD-10-CM | POA: Diagnosis not present

## 2022-06-27 DIAGNOSIS — Y92039 Unspecified place in apartment as the place of occurrence of the external cause: Secondary | ICD-10-CM | POA: Diagnosis not present

## 2022-06-27 DIAGNOSIS — S79929A Unspecified injury of unspecified thigh, initial encounter: Secondary | ICD-10-CM | POA: Diagnosis not present

## 2022-06-27 DIAGNOSIS — Z7401 Bed confinement status: Secondary | ICD-10-CM | POA: Diagnosis not present

## 2022-06-27 DIAGNOSIS — R296 Repeated falls: Secondary | ICD-10-CM | POA: Diagnosis not present

## 2022-06-27 DIAGNOSIS — S72302A Unspecified fracture of shaft of left femur, initial encounter for closed fracture: Secondary | ICD-10-CM | POA: Diagnosis not present

## 2022-06-27 LAB — BASIC METABOLIC PANEL
Anion gap: 5 (ref 5–15)
BUN: 18 mg/dL (ref 8–23)
CO2: 23 mmol/L (ref 22–32)
Calcium: 8.8 mg/dL — ABNORMAL LOW (ref 8.9–10.3)
Chloride: 105 mmol/L (ref 98–111)
Creatinine, Ser: 0.76 mg/dL (ref 0.61–1.24)
GFR, Estimated: >60 mL/min (ref >60)
Glucose, Bld: 111 mg/dL — ABNORMAL HIGH (ref 70–99)
Potassium: 4.4 mmol/L (ref 3.5–5.1)
Sodium: 133 mmol/L — ABNORMAL LOW (ref 135–145)

## 2022-06-27 LAB — CBC WITH DIFFERENTIAL/PLATELET
Abs Immature Granulocytes: 0.04 10*3/uL (ref 0.00–0.07)
Basophils Absolute: 0 10*3/uL (ref 0.0–0.1)
Basophils Relative: 1 %
Eosinophils Absolute: 0.1 10*3/uL (ref 0.0–0.5)
Eosinophils Relative: 1 %
HCT: 36 % — ABNORMAL LOW (ref 39.0–52.0)
Hemoglobin: 12.3 g/dL — ABNORMAL LOW (ref 13.0–17.0)
Immature Granulocytes: 1 %
Lymphocytes Relative: 13 %
Lymphs Abs: 0.9 10*3/uL (ref 0.7–4.0)
MCH: 34.9 pg — ABNORMAL HIGH (ref 26.0–34.0)
MCHC: 34.2 g/dL (ref 30.0–36.0)
MCV: 102.3 fL — ABNORMAL HIGH (ref 80.0–100.0)
Monocytes Absolute: 0.5 10*3/uL (ref 0.1–1.0)
Monocytes Relative: 8 %
Neutro Abs: 5 10*3/uL (ref 1.7–7.7)
Neutrophils Relative %: 76 %
Platelets: 216 10*3/uL (ref 150–400)
RBC: 3.52 MIL/uL — ABNORMAL LOW (ref 4.22–5.81)
RDW: 12.7 % (ref 11.5–15.5)
WBC: 6.6 10*3/uL (ref 4.0–10.5)
nRBC: 0 % (ref 0.0–0.2)

## 2022-06-27 MED ORDER — HYDROCODONE-ACETAMINOPHEN 5-325 MG PO TABS
1.0000 | ORAL_TABLET | Freq: Four times a day (QID) | ORAL | Status: DC | PRN
  Administered 2022-06-28 – 2022-06-29 (×3): 2 via ORAL
  Filled 2022-06-27 (×5): qty 2

## 2022-06-27 MED ORDER — FENTANYL CITRATE PF 50 MCG/ML IJ SOSY
50.0000 ug | PREFILLED_SYRINGE | INTRAMUSCULAR | Status: DC | PRN
  Administered 2022-06-27: 50 ug via INTRAVENOUS
  Filled 2022-06-27: qty 1

## 2022-06-27 MED ORDER — MORPHINE SULFATE (PF) 4 MG/ML IV SOLN
4.0000 mg | Freq: Once | INTRAVENOUS | Status: AC
  Administered 2022-06-27: 4 mg via INTRAVENOUS
  Filled 2022-06-27: qty 1

## 2022-06-27 MED ORDER — SODIUM CHLORIDE 0.9 % IV SOLN: INTRAVENOUS | Status: DC

## 2022-06-27 MED ORDER — HYDROMORPHONE HCL 1 MG/ML IJ SOLN
1.0000 mg | INTRAMUSCULAR | Status: DC | PRN
  Administered 2022-06-27: 1 mg via INTRAVENOUS

## 2022-06-27 MED ORDER — SENNOSIDES-DOCUSATE SODIUM 8.6-50 MG PO TABS: 1.0000 | ORAL_TABLET | Freq: Every evening | ORAL | Status: DC | PRN

## 2022-06-27 MED ORDER — ORAL CARE MOUTH RINSE: 15.0000 mL | OROMUCOSAL | Status: DC | PRN

## 2022-06-27 MED ORDER — MORPHINE SULFATE (PF) 4 MG/ML IV SOLN: 4.0000 mg | INTRAVENOUS | Status: DC | PRN

## 2022-06-27 MED ORDER — HYDROMORPHONE HCL 1 MG/ML IJ SOLN
1.0000 mg | INTRAMUSCULAR | Status: DC | PRN
  Filled 2022-06-27: qty 1

## 2022-06-27 MED ORDER — MORPHINE SULFATE (PF) 2 MG/ML IV SOLN
1.0000 mg | INTRAVENOUS | Status: DC | PRN
  Administered 2022-06-27: 1 mg via INTRAVENOUS
  Filled 2022-06-27: qty 1

## 2022-06-27 MED ORDER — MORPHINE SULFATE (PF) 2 MG/ML IV SOLN: 0.5000 mg | INTRAVENOUS | Status: DC | PRN

## 2022-06-27 MED ORDER — HYDROMORPHONE HCL 1 MG/ML IJ SOLN: 1.0000 mg | INTRAMUSCULAR | Status: DC | PRN

## 2022-06-27 NOTE — Consult Note (Signed)
ORTHOPAEDIC CONSULTATION  REQUESTING PHYSICIAN: Cox, Amy N, DO  Chief Complaint:   Left hip pain.  History of Present Illness: Andrew Walsh is a 81 y.o. male with multiple medical problems including hyperlipidemia, hereditary hemochromatosis, prostate CA, and various skin cancers who lives independently with his wife.  The patient apparently had undergone a lumbar decompression 5 months ago by Dr. Izora Ribas.  After the surgery, he has noticed great difficulty with balance despite extensive daily exercises.  The patient was in his usual state of health this morning, ambulating with 2 crutches on a walk when he lost his balance and fell onto his left side.  He was brought to the emergency room where x-rays demonstrated an essentially nondisplaced greater trochanteric fracture of his left hip.  He is approximately 12 years status post a left total hip arthroplasty performed at [facility] in [place], from which he has done quite well until the fall today.  The patient denies any associated injuries.  Did not strike his head or lose consciousness.  He also denies any lightheadedness, dizziness, chest pain, shortness of breath, or other symptoms which may have precipitated his fall.  Past Medical History:  Diagnosis Date   Actinic keratosis 01/31/2016   Left lateral neck post auricular. AK and prurigo nodularis   Actinic keratosis 01/31/2016   Left lateral neck post auricular, inferior. AK and prurigo nodularis   Actinic keratosis 12/19/2017   Left ear anti-helix   Ankle arthropathy 02/24/2014   Arthritis of foot, degenerative 10/03/2012   Overview:  Subtalar arthritis    Basal cell carcinoma 04/30/2008   Left mid dorsum nose.   Basal cell carcinoma 07/12/2009   Left post. deltoid sup. lat. edge of cancer scar.    Cancer ([place])    PROSTATE   Elevated PSA    Hereditary hemochromatosis ([place]) 11/03/2015   Overview:  Treated by regular  blood donation    History of kidney stones    HLD (hyperlipidemia) 11/03/2015   Hyperlipidemia    Inguinal hernia    Localized, primary osteoarthritis of ankle or foot 02/24/2014   Osteoarthritis    Squamous cell carcinoma of skin 05/17/2010   Left forearm. KA-like pattern. EDC.   Squamous cell carcinoma of skin 12/18/2016   Right mid lat. volar forearm.WD SCC. EDC   Squamous cell carcinoma of skin 01/22/2019   Right lateral distal tricep near elbow.    Squamous cell carcinoma of skin 07/14/2019   Right forearm. WD SCC. EDC   Squamous cell carcinoma of skin 09/29/2019   Right proximal mandible. SCCis   Squamous cell carcinoma of skin 08/17/2020   Left ear sup helix   Past Surgical History:  Procedure Laterality Date   ANKLE FUSION Right 2001, 2016   BONE MARROW ASPIRATION     COLONOSCOPY WITH PROPOFOL N/A 08/09/2020   Procedure: COLONOSCOPY WITH PROPOFOL;  Surgeon: Toledo, Benay Pike, MD;  Location: [facility] ENDOSCOPY;  Service: Gastroenterology;  Laterality: N/A;   FLEXIBLE SIGMOIDOSCOPY N/A 01/05/2021   Procedure: FLEXIBLE SIGMOIDOSCOPY;  Surgeon: Toledo, Benay Pike, MD;  Location: [facility];  Service: Gastroenterology;  Laterality: N/A;   FLEXIBLE SIGMOIDOSCOPY N/A 03/23/2021   Procedure: FLEXIBLE SIGMOIDOSCOPY;  Surgeon: Toledo, Benay Pike, MD;  Location: [facility];  Service: Gastroenterology;  Laterality: N/A;  CRYOTHERAPY PATIENT   FOOT ARTHRODESIS, SUBTALAR     HYDROCELE EXCISION / REPAIR     HYDROCELE EXCISION / REPAIR     JOINT REPLACEMENT Bilateral 02-2004/05-2010   KNEE ARTHROSCOPY  LUMBAR LAMINECTOMY/DECOMPRESSION MICRODISCECTOMY N/A 01/13/2022   Procedure: L3-S1 POSTERIOR SPIAL DECOMPRESSION;  Surgeon: Meade Maw, MD;  Location: [facility] ORS;  Service: Neurosurgery;  Laterality: N/A;   REVISION TOTAL HIP ARTHROPLASTY  2006   Right 2011, Left 2006   Social History   Socioeconomic History   Marital status: Married    Spouse name: Not on file   Number of  children: Not on file   Years of education: Not on file   Highest education level: Not on file  Occupational History   Not on file  Tobacco Use   Smoking status: Former    Years: 20.00    Types: Cigarettes    Quit date: 09/10/1979    Years since quitting: 42.8   Smokeless tobacco: Never  Vaping Use   Vaping Use: Never used  Substance and Sexual Activity   Alcohol use: Yes    Alcohol/week: 32.0 standard drinks of alcohol    Types: 16 Glasses of wine, 16 Standard drinks or equivalent per week   Drug use: Never   Sexual activity: Not Currently  Other Topics Concern   Not on file  Social History Narrative   Live at home with wife.   Social Determinants of Health   Financial Resource Strain: Not on file  Food Insecurity: Not on file  Transportation Needs: Not on file  Physical Activity: Not on file  Stress: Not on file  Social Connections: Not on file   Family History  Problem Relation Age of Onset   Hemachromatosis Sister    Bladder Cancer Neg Hx    Prostate cancer Neg Hx    Kidney cancer Neg Hx    No Known Allergies Prior to Admission medications   Medication Sig Start Date End Date Taking? Authorizing Provider  Ascorbic Acid (VITAMIN C) 1000 MG tablet Take 1,000 mg by mouth daily.   Yes [provider]  Calcium Carbonate-Vit D-Min (CALCIUM 600+D3 PLUS MINERALS) 600-800 MG-UNIT TABS Take 2 tablets by mouth daily.   Yes [provider]  Glucosamine HCl 1000 MG TABS Take 2,000 mg by mouth daily.   Yes [provider]  loperamide (IMODIUM) 2 MG capsule Take 4 mg by mouth daily.   Yes [provider]  simvastatin (ZOCOR) 20 MG tablet Take 20 mg by mouth every other day. 11/07/15  Yes [provider]  vitamin B-12 (CYANOCOBALAMIN) 500 MCG tablet Take 500 mcg by mouth daily.   Yes [provider]  Vitamin A 2400 MCG (8000 UT) CAPS Take 2,400 mcg by mouth daily. Patient not taking: Reported on 06/27/2022    [provider]   Chest Portable 1 View  Result Date: 06/27/2022 CLINICAL DATA:  Frequent falls, left hip fracture EXAM: PORTABLE CHEST 1 VIEW COMPARISON:  None Available. FINDINGS: The heart size and mediastinal contours are within normal limits given AP technique and low lung volumes. No focal pulmonary opacity. No pleural effusion or pneumothorax. No acute osseous abnormality. IMPRESSION: No acute cardiopulmonary process. Electronically Signed   By: Merilyn Baba M.D.   On: 06/27/2022 16:12   DG Hip Unilat With Pelvis 2-3 Views Left  Result Date: 06/27/2022 CLINICAL DATA:  Fall. EXAM: DG HIP (WITH OR WITHOUT PELVIS) 2-3V LEFT COMPARISON:  None Available. FINDINGS: Bilateral hip replacement in satisfactory position and alignment. Fracture proximal left femur involving the proximal diaphysis. Mild displacement. IMPRESSION: Mildly displaced fracture proximal femoral diaphysis. Bilateral hip replacement Electronically Signed   By: Franchot Gallo M.D.   On: 06/27/2022 14:46  Positive ROS: All other systems have been reviewed and were otherwise negative with the exception of those mentioned in the HPI and as above.  Physical Exam: General:  Alert, no acute distress Psychiatric:  Patient is competent for consent with normal mood and affect   Cardiovascular:  No pedal edema Respiratory:  No wheezing, non-labored breathing GI:  Abdomen is soft and non-tender Skin:  No lesions in the area of chief complaint Neurologic:  Sensation intact distally Lymphatic:  No axillary or cervical lymphadenopathy  Orthopedic Exam:  Orthopedic examination is limited to the left hip and lower extremity.  The left lower extremity is symmetrically aligned in relation to the right lower extremity.  Skin inspection around the left hip is notable for well-healed surgical incision, but otherwise is unremarkable.  No swelling, erythema, ecchymosis, abrasions, or other skin abnormalities are identified.  He has  mild-moderate tenderness to palpation over the lateral aspect of the hip.  He has more severe pain with any attempted active or passive motion of the hip.  Grossly, he is neurovascular intact to the left lower extremity and foot, demonstrating the ability to dorsiflex and plantarflex his toes and ankle.  Sensation is intact to light touch to all distributions.  He has good capillary refill to his left foot.  X-rays:  X-rays of the pelvis and left hip are available for review and have been reviewed by myself.  The findings are as described above.  These films exhibits a minimally displaced greater trochanteric fracture around what appears to be a well-positioned total hip arthroplasty which shows no evidence of loosening.  The femoral head is concentrically located within the acetabulum.  Assessment: Essentially nondisplaced periprosthetic greater trochanteric fracture, left hip.  Plan: The treatment options have been discussed with the patient and his wife, who is at the bedside.  Based on the x-rays, this fracture does not require surgical intervention.  Once his pain is controlled, he may be mobilized with physical therapy, partial weightbearing on the left leg with a walker for balance and support.  Thank you for asking me to participate in the care of this most pleasant and unfortunate man.  I will be happy to follow him with you.   Pascal Lux, MD  Beeper #:  [phone]  06/27/2022 5:39 PM

## 2022-06-27 NOTE — Progress Notes (Signed)
Pt has arrived to 1 A room 150. Alert and oriented x4, identified appropriately.  VS stable, no signs of acute distress. Pt oriented to room and equipment, instructed to call for assistance. Instructed on how to use phone and call light, and call light within pt reach.  Bed alarm in place. Will continue to monitor pt.

## 2022-06-27 NOTE — Assessment & Plan Note (Addendum)
-   Fall precautions - Pain control: Norco 1 to 2 tablets p.o. every 6 hours as needed for moderate pain; morphine 1 mg IV every 2 hours as needed for severe pain - PT, OT, nonweightbearing as tolerated - TOC consulted for SNF - Continue n.p.o. pending orthopedic evaluation - Admit to MedSurg, inpatient, no telemetry

## 2022-06-27 NOTE — ED Provider Triage Note (Signed)
Emergency Medicine Provider Triage Evaluation Note  Andrew Walsh , a 81 y.o. male  was evaluated in triage.  Pt complains of fall that occurred at 1pm. Reports that he is recovering from back surgery and is still recovering his walking ability. Reports landed on right hip. Unable to bear weight. Has significant pain with any movement of his left hip. No HS or LOC. No paresthesias  Review of Systems  Positive: Hip pain Negative: Headache, neck pain  Physical Exam  There were no vitals taken for this visit. Gen:   Awake, no distress   Resp:  Normal effort  MSK:   Moves extremities without difficulty  Other:  +pain with log roll, and any attempted PROM/AROM. Normal distal pulses  Medical Decision Making  Medically screening exam initiated at 2:06 PM.  Appropriate orders placed.  DVON JILES was informed that the remainder of the evaluation will be completed by another provider, this initial triage assessment does not replace that evaluation, and the importance of remaining in the ED until their evaluation is complete.     Marquette Old, PA-C 06/27/22 1409

## 2022-06-27 NOTE — Assessment & Plan Note (Signed)
-   Continue periodic phlebotomy sessions with IV fluid.

## 2022-06-27 NOTE — Hospital Course (Addendum)
Andrew Walsh is a 81 year old male with history of hyperlipidemia, hereditary hemochromatosis, requiring periodic phlebotomy sessions with IV fluid, lumbar stenosis status post lumbar decompression, who presents emergency department for chief concerns of fall with resulting left hip pain and inability to bear weight.  Initial vitals in the emergency department showed temperature 97.9, respiration rate of 18, heart rate of 49, blood pressure 177/81, SPO2 of 98% on room air.  Serum sodium is 133, potassium 4.4, chloride 105, bicarb 23, nonfasting blood glucose 111, BUN of 18, serum creatinine of 0.76, GFR greater than 60.  WBC 6.6, hemoglobin 12.3, platelets of 216.  Left hip x-ray: Was read as mildly displaced fracture proximal femoral diaphysis.  Bilateral hip replacement.  ED treatment: Morphine 4 mg IV.

## 2022-06-27 NOTE — Assessment & Plan Note (Signed)
-   Secondary to close left femoral fracture - Pain control: Norco 5-325, 1 to 2 tablets every 6 hours as needed for moderate pain; morphine 4 mg every 2 hours as needed for severe pain, 4 doses ordered; Dilaudid 1 mg every 3 hours as needed for severe pain

## 2022-06-27 NOTE — ED Notes (Signed)
First nurse note:   Presents to ED via [facility] with c/o of falling after losing balance and is now c/o if L hip pain. Pt has skin tear to L elbow, bleeding controlled.   189/50 HR 60 98%  20G L AC

## 2022-06-27 NOTE — H&P (Signed)
History and Physical   KIM LAUVER TGG:[id number] DOB: 08-18-1941 DOA: 06/27/2022  PCP: Adin Hector, MD  Outpatient Specialists: Dr. Tasia Catchings, hematology/oncology Patient coming from: Home  I have personally briefly reviewed patient's old medical records in [place].  Chief Concern: Fall  HPI: [name] is a 81 year old male with history of hyperlipidemia, hereditary hemochromatosis, requiring periodic phlebotomy sessions with IV fluid, lumbar stenosis status post lumbar decompression, who presents emergency department for chief concerns of fall with resulting left hip pain and inability to bear weight.  Initial vitals in the emergency department showed temperature 97.9, respiration rate of 18, heart rate of 49, blood pressure 177/81, SPO2 of 98% on room air.  Serum sodium is 133, potassium 4.4, chloride 105, bicarb 23, nonfasting blood glucose 111, BUN of 18, serum creatinine of 0.76, GFR greater than 60.  WBC 6.6, hemoglobin 12.3, platelets of 216.  Left hip x-ray: Was read as mildly displaced fracture proximal femoral diaphysis.  Bilateral hip replacement.  ED treatment: Morphine 4 mg IV. At bedside, he is able to tell me his name, age, current location.   He was taking his usual walk, with two walking sticks, and fell on flat surface. He fell on left side. He denies head trauma, lost of consciousness. He is currently extended home physical therapy.   He denies chest pain, shortness of breath, dysuria, hematuria, changes to his bowel habits.  He denies any fever, dizziness.  He reports that several months ago he underwent lumbar decompression and since then he has been unable to ambulate like before and has had difficulty walking around his home.  He has had to use a rollator walker and has endured home physical therapy and has now graduated to walking sticks.  He reports that since his surgery he has had difficulty with balance.  Social history: He lives at home  with his wife.  He is a former tobacco user quitting in the 1980s.  He endorses daily EtOH drinks.  He denies recreational drug use.  He is retired.  He was formally very active, running about 7 marathons in his lifetime with frequent travels.  ROS: Constitutional: no weight change, no fever ENT/Mouth: no sore throat, no rhinorrhea Eyes: no eye pain, no vision changes Cardiovascular: no chest pain, no dyspnea,  no edema, no palpitations Respiratory: no cough, no sputum, no wheezing Gastrointestinal: no nausea, no vomiting, no diarrhea, no constipation Genitourinary: no urinary incontinence, no dysuria, no hematuria Musculoskeletal: no arthralgias, no myalgias Skin: no skin lesions, no pruritus, Neuro: + weakness, no loss of consciousness, no syncope, + fall Psych: no anxiety, no depression, + decrease appetite Heme/Lymph: no bruising, no bleeding  ED Course: Discussed with emergency medicine provider, patient requiring hospitalization for chief concerns of left mildly displaced femoral fracture.  Assessment/Plan  Principal Problem:   Closed left femoral fracture (HCC) Active Problems:   History of prostate cancer   Hereditary hemochromatosis ([place])   HLD (hyperlipidemia)   Lumbar stenosis   Left hip pain   Assessment and Plan:  * Closed left femoral fracture ([place]) - Fall precautions - Pain control: Norco 1 to 2 tablets p.o. every 6 hours as needed for moderate pain; morphine 1 mg IV every 2 hours as needed for severe pain - PT, OT, nonweightbearing as tolerated - TOC consulted for SNF - Continue n.p.o. pending orthopedic evaluation - Admit to MedSurg, inpatient, no telemetry  Left hip pain - Secondary to close left femoral fracture - Pain  control: Norco 5-325, 1 to 2 tablets every 6 hours as needed for moderate pain; morphine 4 mg every 2 hours as needed for severe pain, 4 doses ordered; Dilaudid 1 mg every 3 hours as needed for severe pain  HLD (hyperlipidemia) -  Simvastatin 20 mg every other day  Hereditary hemochromatosis ([place]) - Continue periodic phlebotomy sessions with IV fluid.  DVT prophylaxis-pharmacologic DVT prophylaxis has not been initiated on admission, pending orthopedic evaluation for possible surgery versus no surgery - AM team to initiate pharmacologic DVT prophylaxis when the benefits outweigh the risk  Chart reviewed.   DVT prophylaxis: TED hose Code Status: Full code Diet: N.p.o. pending Ortho evaluation Family Communication: Updated spouse at bedside with patient's permission Disposition Plan: Pending clinical course Consults called: Orthopedic Admission status: MedSurg, inpatient  Past Medical History:  Diagnosis Date   Actinic keratosis 01/31/2016   Left lateral neck post auricular. AK and prurigo nodularis   Actinic keratosis 01/31/2016   Left lateral neck post auricular, inferior. AK and prurigo nodularis   Actinic keratosis 12/19/2017   Left ear anti-helix   Ankle arthropathy 02/24/2014   Arthritis of foot, degenerative 10/03/2012   Overview:  Subtalar arthritis    Basal cell carcinoma 04/30/2008   Left mid dorsum nose.   Basal cell carcinoma 07/12/2009   Left post. deltoid sup. lat. edge of cancer scar.    Cancer ([place])    PROSTATE   Elevated PSA    Hereditary hemochromatosis ([place]) 11/03/2015   Overview:  Treated by regular blood donation    History of kidney stones    HLD (hyperlipidemia) 11/03/2015   Hyperlipidemia    Inguinal hernia    Localized, primary osteoarthritis of ankle or foot 02/24/2014   Osteoarthritis    Squamous cell carcinoma of skin 05/17/2010   Left forearm. KA-like pattern. EDC.   Squamous cell carcinoma of skin 12/18/2016   Right mid lat. volar forearm.WD SCC. EDC   Squamous cell carcinoma of skin 01/22/2019   Right lateral distal tricep near elbow.    Squamous cell carcinoma of skin 07/14/2019   Right forearm. WD SCC. EDC   Squamous cell carcinoma of skin 09/29/2019   Right  proximal mandible. SCCis   Squamous cell carcinoma of skin 08/17/2020   Left ear sup helix   Past Surgical History:  Procedure Laterality Date   ANKLE FUSION Right 2001, 2016   BONE MARROW ASPIRATION     COLONOSCOPY WITH PROPOFOL N/A 08/09/2020   Procedure: COLONOSCOPY WITH PROPOFOL;  Surgeon: Toledo, Benay Pike, MD;  Location: [facility] ENDOSCOPY;  Service: Gastroenterology;  Laterality: N/A;   FLEXIBLE SIGMOIDOSCOPY N/A 01/05/2021   Procedure: FLEXIBLE SIGMOIDOSCOPY;  Surgeon: Toledo, Benay Pike, MD;  Location: [facility] ENDOSCOPY;  Service: Gastroenterology;  Laterality: N/A;   FLEXIBLE SIGMOIDOSCOPY N/A 03/23/2021   Procedure: FLEXIBLE SIGMOIDOSCOPY;  Surgeon: Toledo, Benay Pike, MD;  Location: [facility] ENDOSCOPY;  Service: Gastroenterology;  Laterality: N/A;  CRYOTHERAPY PATIENT   FOOT ARTHRODESIS, SUBTALAR     HYDROCELE EXCISION / REPAIR     HYDROCELE EXCISION / REPAIR     JOINT REPLACEMENT Bilateral 02-2004/05-2010   KNEE ARTHROSCOPY     LUMBAR LAMINECTOMY/DECOMPRESSION MICRODISCECTOMY N/A 01/13/2022   Procedure: L3-S1 POSTERIOR SPIAL DECOMPRESSION;  Surgeon: Meade Maw, MD;  Location: [facility] ORS;  Service: Neurosurgery;  Laterality: N/A;   REVISION TOTAL HIP ARTHROPLASTY  2006   Right 2011, Left 2006   Social History:  reports that he quit smoking about 42 years ago. His smoking use included cigarettes. He has  never used smokeless tobacco. He reports current alcohol use of about 32.0 standard drinks of alcohol per week. He reports that he does not use drugs.  No Known Allergies Family History  Problem Relation Age of Onset   Hemachromatosis Sister    Bladder Cancer Neg Hx    Prostate cancer Neg Hx    Kidney cancer Neg Hx    Family history: Family history reviewed and not pertinent.  Prior to Admission medications   Medication Sig Start Date End Date Taking? Authorizing Provider  Ascorbic Acid (VITAMIN C) 1000 MG tablet Take 1,000 mg by mouth daily.    [provider]  Calcium  Carbonate-Vit D-Min (CALCIUM 600+D3 PLUS MINERALS) 600-800 MG-UNIT TABS Take 2 tablets by mouth daily.    [provider]  Glucosamine HCl 1000 MG TABS Take 2,000 mg by mouth daily.    [provider]  loperamide (IMODIUM) 2 MG capsule Take 4 mg by mouth daily.    [provider]  simvastatin (ZOCOR) 20 MG tablet Take 20 mg by mouth every other day. 11/07/15   [provider]  Vitamin A 2400 MCG (8000 UT) CAPS Take 2,400 mcg by mouth daily.    [provider]  vitamin B-12 (CYANOCOBALAMIN) 500 MCG tablet Take 500 mcg by mouth daily.    [provider]   Physical Exam: Vitals:   06/27/22 1408 06/27/22 1410  BP: (!) 177/81   Pulse: (!) 49   Resp: 18   Temp: 97.9 F (36.6 C)   TempSrc: Oral   SpO2: 98%   Weight:  73.4 kg  Height:  '5\' 7"'$  (1.702 m)   Constitutional: appears age-appropriate, NAD, calm, comfortable Eyes: PERRL, lids and conjunctivae normal ENMT: Mucous membranes are moist. Posterior pharynx clear of any exudate or lesions. Age-appropriate dentition. Hearing appropriate Neck: normal, supple, no masses, no thyromegaly Respiratory: clear to auscultation bilaterally, no wheezing, no crackles. Normal respiratory effort. No accessory muscle use.  Cardiovascular: Regular rate and rhythm, no murmurs / rubs / gallops. No extremity edema. 2+ pedal pulses. No carotid bruits.  Abdomen: no tenderness, no masses palpated, no hepatosplenomegaly. Bowel sounds positive.  Musculoskeletal: no clubbing / cyanosis. No joint deformity upper and lower extremities. Good ROM in bilateral upper extremities and right lower extremity, no contractures, no atrophy. Normal muscle tone.  Decreased range of motion in the left lower extremity secondary to fall Skin: no rashes, lesions, ulcers. No induration Neurologic: Sensation intact. Strength 5/5 in all 4.  Psychiatric: Normal judgment and insight. Alert and oriented x 3. Normal mood.   EKG:  independently reviewed, showing sinus bradycardia with rate of 50, QTc 424  x-ray on Admission: I personally reviewed and I agree with radiologist reading as below.  Chest Portable 1 View  Result Date: 06/27/2022 CLINICAL DATA:  Frequent falls, left hip fracture EXAM: PORTABLE CHEST 1 VIEW COMPARISON:  None Available. FINDINGS: The heart size and mediastinal contours are within normal limits given AP technique and low lung volumes. No focal pulmonary opacity. No pleural effusion or pneumothorax. No acute osseous abnormality. IMPRESSION: No acute cardiopulmonary process. Electronically Signed   By: Merilyn Baba M.D.   On: 06/27/2022 16:12   DG Hip Unilat With Pelvis 2-3 Views Left  Result Date: 06/27/2022 CLINICAL DATA:  Fall. EXAM: DG HIP (WITH OR WITHOUT PELVIS) 2-3V LEFT COMPARISON:  None Available. FINDINGS: Bilateral hip replacement in satisfactory position and alignment. Fracture proximal left femur involving the proximal diaphysis. Mild displacement. IMPRESSION: Mildly displaced fracture proximal  femoral diaphysis. Bilateral hip replacement Electronically Signed   By: Franchot Gallo M.D.   On: 06/27/2022 14:46    Labs on Admission: I have personally reviewed following labs  CBC: Recent Labs  Lab 06/27/22 1530  WBC 6.6  NEUTROABS 5.0  HGB 12.3*  HCT 36.0*  MCV 102.3*  PLT 010    Basic Metabolic Panel: Recent Labs  Lab 06/27/22 1530  NA 133*  K 4.4  CL 105  CO2 23  GLUCOSE 111*  BUN 18  CREATININE 0.76  CALCIUM 8.8*    GFR: Estimated Creatinine Clearance: 66.6 mL/min (by C-G formula based on SCr of 0.76 mg/dL).  Urine analysis:    Component Value Date/Time   COLORURINE YELLOW (A) 01/18/2022 1101   APPEARANCEUR CLEAR (A) 01/18/2022 1101   APPEARANCEUR Clear 12/01/2015 1438   LABSPEC 1.013 01/18/2022 1101   PHURINE 6.0 01/18/2022 1101   GLUCOSEU NEGATIVE 01/18/2022 1101   HGBUR MODERATE (A) 01/18/2022 1101   BILIRUBINUR NEGATIVE 01/18/2022 1101   BILIRUBINUR  Negative 12/01/2015 1438   KETONESUR NEGATIVE 01/18/2022 1101   PROTEINUR NEGATIVE 01/18/2022 1101   NITRITE NEGATIVE 01/18/2022 1101   LEUKOCYTESUR NEGATIVE 01/18/2022 1101   Dr. Tobie Poet [facility]  If 7PM-7AM, please contact overnight-coverage provider If 7AM-7PM, please contact day coverage provider [email or web address]  06/27/2022, 4:50 PM

## 2022-06-27 NOTE — ED Provider Notes (Signed)
[facility] Provider Note    Event Date/Time   First MD Initiated Contact with Patient 06/27/22 1454     (approximate)   History   Fall   HPI  Andrew Walsh is a 81 y.o. male with a past medical history of chronic back pain/lumbar stenosis, hyperlipidemia who presents today for evaluation of left hip pain after a trip and fall.  Patient reports that he has had back surgery several months ago and has had trouble walking ever since.  Today he had a trip and fall and landed directly onto his left hip.  He did not strike his head or lose consciousness.  He has not been able to walk since.  He has pain with any attempted movement of his left hip.  He denies any other pain elsewhere.  He denies any numbness or tingling.  He has not had any saddle anesthesia or urinary/fecal incontinence or retention.  Patient Active Problem List   Diagnosis Date Noted   Alcohol use 06/06/2022   History of prostate cancer 06/06/2022   Lumbar stenosis 01/13/2022   Hereditary hemochromatosis ([place]) 11/03/2015   HLD (hyperlipidemia) 11/03/2015   Ankle arthropathy 02/24/2014   Localized, primary osteoarthritis of ankle or foot 02/24/2014   Arthritis of foot, degenerative 10/03/2012          Physical Exam   Triage Vital Signs: ED Triage Vitals  Enc Vitals Group     BP 06/27/22 1408 (!) 177/81     Pulse Rate 06/27/22 1408 (!) 49     Resp 06/27/22 1408 18     Temp 06/27/22 1408 97.9 F (36.6 C)     Temp Source 06/27/22 1408 Oral     SpO2 06/27/22 1408 98 %     Weight 06/27/22 1410 161 lb 13.1 oz (73.4 kg)     Height 06/27/22 1410 '5\' 7"'$  (1.702 m)     Head Circumference --      Peak Flow --      Pain Score 06/27/22 1410 9     Pain Loc --      Pain Edu? --      Excl. in [place]? --     Most recent vital signs: Vitals:   06/27/22 1408  BP: (!) 177/81  Pulse: (!) 49  Resp: 18  Temp: 97.9 F (36.6 C)  SpO2: 98%    Physical Exam Vitals and nursing note reviewed.   Constitutional:      General: Awake and alert. No acute distress.    Appearance: Normal appearance. The patient is normal weight.  HENT:     Head: Normocephalic and atraumatic.     Mouth: Mucous membranes are moist.  Eyes:     General: PERRL. Normal EOMs        Right eye: No discharge.        Left eye: No discharge.     Conjunctiva/sclera: Conjunctivae normal.  Cardiovascular:     Rate and Rhythm: Normal rate and regular rhythm.     Pulses: Normal pulses.     Heart sounds: Normal heart sounds Pulmonary:     Effort: Pulmonary effort is normal. No respiratory distress.     Breath sounds: Normal breath sounds.  Abdominal:     Abdomen is soft. There is no abdominal tenderness. No rebound or guarding. No distention. Pelvis stable.  Tenderness to palpation to lateral hip, pain with logroll.  Unable to actively or passively flex hip secondary to pain.  Sensation intact light  touch distally.  Normal distal pulses. Musculoskeletal:        General: No swelling. Normal range of motion.     Cervical back: Normal range of motion and neck supple.  No midline cervical spine tenderness.  Full range of motion of neck.  Negative Spurling test.  Negative Lhermitte sign.  Normal strength and sensation in bilateral upper extremities. Normal grip strength bilaterally.  Normal intrinsic muscle function of the hand bilaterally.  Normal radial pulses bilaterally. Back: No midline tenderness. Strength and sensation 5/5 to bilateral lower extremities. Normal great toe extension against resistance. Normal sensation throughout feet. Skin:    General: Skin is warm and dry.     Capillary Refill: Capillary refill takes less than 2 seconds.     Findings: No rash.  Neurological:     Mental Status: The patient is awake and alert.  Neurological: GCS 15 alert and oriented x3 Normal speech, no expressive or receptive aphasia or dysarthria Cranial nerves II through XII intact Normal visual fields 5 out of 5 strength  in extremities (except LLE secondary to pain) with intact sensation throughout No extremity drift Normal finger-to-nose testing, no limb or truncal ataxia      ED Results / Procedures / Treatments   Labs (all labs ordered are listed, but only abnormal results are displayed) Labs Reviewed  CBC WITH DIFFERENTIAL/PLATELET  BASIC METABOLIC PANEL     EKG     RADIOLOGY I independently reviewed and interpreted imaging and agree with radiologists findings.     PROCEDURES:  Critical Care performed:   Procedures   MEDICATIONS ORDERED IN ED: Medications  morphine (PF) 4 MG/ML injection 4 mg (has no administration in time range)     IMPRESSION / MDM / ASSESSMENT AND PLAN / ED COURSE  I reviewed the triage vital signs and the nursing notes.   Differential diagnosis includes, but is not limited to, hip fracture, dislocation, contusion.  Patient is awake and alert, hemodynamically stable and afebrile.  He is unable to move his leg at all secondary to pain.  Vascularly intact.  X-ray was obtained in triage which demonstrates a mildly displaced fracture of the proximal femoral diaphysis.  This was discussed with orthopedics who will consult on the patient.  Given patient's inability to perform his ADLs he was admitted to the hospitalist service.  Patient was given pain control with morphine.   Patient's presentation is most consistent with acute presentation with potential threat to life or bodily function.    Clinical Course as of 06/27/22 1518  Tue Jun 27, 2022  1505 Discussed with Ortho, currently in the operating room.  He will review the images and call me back [JP]  1511 Orthopedics plans to consult on patient during admission to hospitalist.  Awaiting return call from hospitalist [JP]    Clinical Course User Index [JP] Yadier Bramhall, Clarnce Flock, PA-C     FINAL CLINICAL IMPRESSION(S) / ED DIAGNOSES   Final diagnoses:  Peri-prosthetic subtrochanteric femur fracture  Fall in  home, initial encounter     Rx / DC Orders   ED Discharge Orders     None        Note:  This document was prepared using Dragon voice recognition software and may include unintentional dictation errors.   Emeline Gins 06/27/22 1518    Merlyn Lot, MD 06/27/22 [phone]

## 2022-06-27 NOTE — ED Triage Notes (Signed)
Pt here with a fall today. Pt had back surgery 4 months ago and has not been able to walk as well since then. Pt denies LOC.

## 2022-06-27 NOTE — Assessment & Plan Note (Signed)
-   Simvastatin 20 mg every other day

## 2022-06-27 NOTE — Progress Notes (Signed)
PT Cancellation Note  Patient Details Name: Andrew Walsh MRN: [id number] DOB: August 22, 1941   Cancelled Treatment:    Reason Eval/Treat Not Completed: Patient not medically ready;Medical issues which prohibited therapy (Pt here for fall, workup showing periprosthetic fracture. Orthopedics consult pending. Will sign off at this time and await new order from ortho once ready.)  4:04 PM, 06/27/22 [name], PT, DPT Physical Therapist - [facility]  [phone] ([place])    [place] 06/27/2022, 4:04 PM

## 2022-06-28 DIAGNOSIS — S7292XA Unspecified fracture of left femur, initial encounter for closed fracture: Secondary | ICD-10-CM | POA: Diagnosis not present

## 2022-06-28 DIAGNOSIS — D539 Nutritional anemia, unspecified: Secondary | ICD-10-CM | POA: Diagnosis not present

## 2022-06-28 LAB — BASIC METABOLIC PANEL
Anion gap: 7 (ref 5–15)
BUN: 14 mg/dL (ref 8–23)
CO2: 22 mmol/L (ref 22–32)
Calcium: 8.9 mg/dL (ref 8.9–10.3)
Chloride: 106 mmol/L (ref 98–111)
Creatinine, Ser: 0.61 mg/dL (ref 0.61–1.24)
GFR, Estimated: >60 mL/min (ref >60)
Glucose, Bld: 106 mg/dL — ABNORMAL HIGH (ref 70–99)
Potassium: 3.9 mmol/L (ref 3.5–5.1)
Sodium: 135 mmol/L (ref 135–145)

## 2022-06-28 LAB — CBC
HCT: 33.6 % — ABNORMAL LOW (ref 39.0–52.0)
Hemoglobin: 11.6 g/dL — ABNORMAL LOW (ref 13.0–17.0)
MCH: 35.4 pg — ABNORMAL HIGH (ref 26.0–34.0)
MCHC: 34.5 g/dL (ref 30.0–36.0)
MCV: 102.4 fL — ABNORMAL HIGH (ref 80.0–100.0)
Platelets: 210 10*3/uL (ref 150–400)
RBC: 3.28 MIL/uL — ABNORMAL LOW (ref 4.22–5.81)
RDW: 12.4 % (ref 11.5–15.5)
WBC: 5.7 10*3/uL (ref 4.0–10.5)
nRBC: 0 % (ref 0.0–0.2)

## 2022-06-28 MED ORDER — ENSURE ENLIVE PO LIQD
237.0000 mL | Freq: Two times a day (BID) | ORAL | Status: DC
  Administered 2022-06-29 – 2022-07-04 (×9): 237 mL via ORAL

## 2022-06-28 MED ORDER — MORPHINE SULFATE (PF) 2 MG/ML IV SOLN
2.0000 mg | INTRAVENOUS | Status: DC | PRN
  Administered 2022-06-29: 2 mg via INTRAVENOUS
  Filled 2022-06-28: qty 1

## 2022-06-28 MED ORDER — ENOXAPARIN SODIUM 40 MG/0.4ML IJ SOSY
40.0000 mg | PREFILLED_SYRINGE | Freq: Every day | INTRAMUSCULAR | Status: DC
  Administered 2022-06-28 – 2022-07-04 (×7): 40 mg via SUBCUTANEOUS
  Filled 2022-06-28 (×7): qty 0.4

## 2022-06-28 NOTE — TOC Progression Note (Signed)
Transition of Care [facility]) - Progression Note    Patient Details  Name: Andrew Walsh MRN: [id number] Date of Birth: 06/16/1941  Transition of Care [facility]) CM/SW [place], RN Phone Number: 06/28/2022, 4:06 PM  Clinical Narrative:      Spoke with the patient, he is agreeable to a bedsearch, FL2 completed, PASSR obtained, bedsearch sent, will review once obtained      Expected Discharge Plan and Services                                                 Social Determinants of Health (SDOH) Interventions    Readmission Risk Interventions     No data to display

## 2022-06-28 NOTE — Evaluation (Signed)
Occupational Therapy Evaluation Patient Details Name: Andrew Walsh MRN: [id number] DOB: [birth year] Today's Date: 06/28/2022   History of Present Illness Pt is an 81 year old male admitted with minimally displaced greater trochanteric fracture around what appears to be a well-positioned total hip arthroplasty which shows no evidence of loosening after fall at home; PMH significant for , hereditary hemochromatosis, requiring periodic phlebotomy sessions with IV fluid, lumbar stenosis status post lumbar decompression   Clinical Impression   Chart reviewed, pt greeted in room agreeable to OT evaluation. Pt is alert and oriented x4, fair safety awareness/awareness of deficits. Pt reports frustration with perceived lack of progress since spinal surgery approx 5 months ago. PTA pt was MOD I for ADL, amb with two walking sticks per pt report. Pt requires MOD A for bed mobility, MOD A for STS with RW, MOD-MAX A for SPT to bedside chair with RW. MAX A required for LB dressing. Pt presents with deficits in strength, endurance, activity tolerance, balance, all affecting safe and optimal ADL completion. Recommend discharge to STR to address functional deficits. OT will continue to follow acutely.      Recommendations for follow up therapy are one component of a multi-disciplinary discharge planning process, led by the attending physician.  Recommendations may be updated based on patient status, additional functional criteria and insurance authorization.   Follow Up Recommendations  Skilled nursing-short term rehab (<3 hours/day)    Assistance Recommended at Discharge Frequent or constant Supervision/Assistance  Patient can return home with the following A lot of help with walking and/or transfers;A lot of help with bathing/dressing/bathroom    Functional Status Assessment  Patient has had a recent decline in their functional status and demonstrates the ability to make significant improvements in  function in a reasonable and predictable amount of time.  Equipment Recommendations  Other (comment) (per next venue of care)    Recommendations for Other Services       Precautions / Restrictions Precautions Precautions: Fall Restrictions Weight Bearing Restrictions: Yes LLE Weight Bearing: Partial weight bearing      Mobility Bed Mobility Overal bed mobility: Needs Assistance Bed Mobility: Supine to Sit     Supine to sit: Mod assist, HOB elevated     General bed mobility comments: frequent vcs for technique    Transfers Overall transfer level: Needs assistance Equipment used: Rolling walker (2 wheels) Transfers: Sit to/from Stand Sit to Stand: Mod assist, From elevated surface           General transfer comment: 2 attempts, pt treating LLE as esentially NWBing, frequent vcs for technique      Balance Overall balance assessment: Needs assistance Sitting-balance support: Feet supported Sitting balance-Leahy Scale: Good     Standing balance support: Bilateral upper extremity supported, Reliant on assistive device for balance Standing balance-Leahy Scale: Poor                             ADL either performed or assessed with clinical judgement   ADL Overall ADL's : Needs assistance/impaired Eating/Feeding: Set up;Sitting   Grooming: Wash/dry hands;Sitting;Set up           Upper Body Dressing : Minimal assistance;Sitting Upper Body Dressing Details (indicate cue type and reason): anticipated Lower Body Dressing: Maximal assistance;Sit to/from stand Lower Body Dressing Details (indicate cue type and reason): doff boxers Toilet Transfer: Moderate assistance;Stand-pivot;Rolling walker (2 wheels) Toilet Transfer Details (indicate cue type and reason): adhering to wbing precautions,  pt essentially NWBing on LLE due to pain with RW, step by step vcs for technique Toileting- Clothing Manipulation and Hygiene: Maximal assistance;Sit to/from  stand;Cueing for sequencing;Cueing for safety               Vision Patient Visual Report: No change from baseline       Perception     Praxis      Pertinent Vitals/Pain Pain Assessment Pain Assessment: Faces Faces Pain Scale: Hurts even more Pain Location: L hip Pain Descriptors / Indicators: Aching, Discomfort Pain Intervention(s): Patient requesting pain meds-RN notified, Limited activity within patient's tolerance, Monitored during session, Repositioned     Hand Dominance Right   Extremity/Trunk Assessment Upper Extremity Assessment Upper Extremity Assessment: Overall WFL for tasks assessed   Lower Extremity Assessment Lower Extremity Assessment: Generalized weakness;Defer to PT evaluation       Communication Communication Communication: No difficulties   Cognition Arousal/Alertness: Awake/alert Behavior During Therapy: WFL for tasks assessed/performed Overall Cognitive Status: Within Functional Limits for tasks assessed Area of Impairment: Safety/judgement, Awareness, Problem solving                         Safety/Judgement: Decreased awareness of deficits Awareness: Emergent Problem Solving: Requires verbal cues, Requires tactile cues       General Comments       Exercises Other Exercises Other Exercises: edu re: role of OT, role of rehab, discharge recommendations, home safety, falls prevention   Shoulder Instructions      Home Living Family/patient expects to be discharged to:: Private residence Living Arrangements: Spouse/significant other Available Help at Discharge: Family;Available 24 hours/day Type of Home: Apartment Home Access: Stairs to enter Entrance Stairs-Number of Steps: 18 Entrance Stairs-Rails: Left (wall on R) Home Layout: One level     Bathroom Shower/Tub: Occupational psychologist: Standard     Home Equipment: Conservation officer, nature (2 wheels);Cane - single point;Crutches;Shower seat;Grab bars - toilet;Grab  bars - tub/shower          Prior Functioning/Environment Prior Level of Function : Independent/Modified Independent             Mobility Comments: amb with 2 walking sticks, reports frustration with limited progress in mobility ADLs Comments: MOD I in ADL, increased time/effort, MOD I IADL        OT Problem List: Decreased strength;Decreased activity tolerance;Impaired balance (sitting and/or standing);Decreased knowledge of use of DME or AE      OT Treatment/Interventions: Self-care/ADL training;Patient/family education;Therapeutic exercise;Balance training;Therapeutic activities;DME and/or AE instruction    OT Goals(Current goals can be found in the care plan section) Acute Rehab OT Goals Patient Stated Goal: go to rehab OT Goal Formulation: With patient Time For Goal Achievement: 07/12/22 Potential to Achieve Goals: Good ADL Goals Pt Will Perform Grooming: with supervision;standing;sitting Pt Will Perform Lower Body Dressing: with min assist;sit to/from stand Pt Will Transfer to Toilet: with min assist Pt Will Perform Toileting - Clothing Manipulation and hygiene: with min assist;sit to/from stand  OT Frequency: Min 2X/week    Co-evaluation              AM-PAC OT "6 Clicks" Daily Activity     Outcome Measure Help from another person eating meals?: None Help from another person taking care of personal grooming?: None Help from another person toileting, which includes using toliet, bedpan, or urinal?: A Lot Help from another person bathing (including washing, rinsing, drying)?: A Lot Help from another person to put  on and taking off regular upper body clothing?: A Little Help from another person to put on and taking off regular lower body clothing?: A Lot 6 Click Score: 17   End of Session Equipment Utilized During Treatment: Gait belt;Rolling walker (2 wheels) Nurse Communication: Patient requests pain meds  Activity Tolerance: Patient tolerated treatment  well Patient left: in chair;with call bell/phone within reach;with chair alarm set  OT Visit Diagnosis: Unsteadiness on feet (R26.81);History of falling (Z91.81)                Time: 5993-5701 OT Time Calculation (min): 24 min Charges:  OT General Charges $OT Visit: 1 Visit OT Evaluation $OT Eval Low Complexity: 1 Low  Shanon Payor, OTD OTR/L  06/28/22, 10:29 AM

## 2022-06-28 NOTE — Evaluation (Signed)
Physical Therapy Evaluation Patient Details Name: Andrew Walsh MRN: [id number] DOB: Sep 22, 1940 Today's Date: 06/28/2022  History of Present Illness  Pt is an 81 year old male admitted with minimally displaced greater trochanteric fracture around what appears to be a well-positioned total hip arthroplasty which shows no evidence of loosening after fall at home; PMH significant for , hereditary hemochromatosis, requiring periodic phlebotomy sessions with IV fluid, lumbar stenosis status post lumbar decompression  Clinical Impression  Pt admitted with above Dx. Pt has functional limitations due to deficits below (see "PT Problem List"). Pt able to provide details on baseline functional status. Today pt requires considerable physical assistance to perform bed mobility, transfers, and is unable to perform any appreciable short distance walking. Pain meds received prior to session, however pain control goals are not met. Patient's performance this date reveals decreased ability, independence, and tolerance in performing all basic mobility required for performance of activities of daily living. Pt requires additional DME, close physical assistance, and cues for safe participate in mobility. Pt will benefit from skilled PT intervention to increase independence and safety with basic mobility in preparation for discharge to the venue listed below.        Recommendations for follow up therapy are one component of a multi-disciplinary discharge planning process, led by the attending physician.  Recommendations may be updated based on patient status, additional functional criteria and insurance authorization.  Follow Up Recommendations Skilled nursing-short term rehab (<3 hours/day) Can patient physically be transported by private vehicle: No    Assistance Recommended at Discharge Set up Supervision/Assistance  Patient can return home with the following  Two people to help with walking and/or  transfers;Help with stairs or ramp for entrance;Assistance with cooking/housework;Direct supervision/assist for financial management    Equipment Recommendations  (will defer to post rehab)  Recommendations for Other Services       Functional Status Assessment Patient has had a recent decline in their functional status and demonstrates the ability to make significant improvements in function in a reasonable and predictable amount of time.     Precautions / Restrictions Precautions Precautions: Fall Restrictions Weight Bearing Restrictions: Yes LLE Weight Bearing: Partial weight bearing      Mobility  Bed Mobility               General bed mobility comments: in recliner    Transfers Overall transfer level: Needs assistance Equipment used: Rolling walker (2 wheels) Transfers: Sit to/from Stand Sit to Stand: Min guard           General transfer comment: max effort, multiple attempts; improved with pillow added to elevation.    Ambulation/Gait Ambulation/Gait assistance:  (deferred due to high pain levels)                Stairs            Wheelchair Mobility    Modified Rankin (Stroke Patients Only)       Balance                                             Pertinent Vitals/Pain Pain Assessment Pain Assessment: 0-10 Pain Score: 10-Worst pain ever Pain Location: L hip Pain Descriptors / Indicators: Aching, Discomfort Pain Intervention(s): Limited activity within patient's tolerance, Monitored during session, Premedicated before session    Home Living Family/patient expects to be discharged to:: Private residence Living  Arrangements: Spouse/significant other Available Help at Discharge: Family;Available 24 hours/day Type of Home: Apartment Home Access: Stairs to enter Entrance Stairs-Rails: Left (wall on R) Entrance Stairs-Number of Steps: 18   Home Layout: One level Home Equipment: Conservation officer, nature (2 wheels);Cane -  single point;Crutches;Shower seat;Grab bars - toilet;Grab bars - tub/shower      Prior Function Prior Level of Function : Independent/Modified Independent             Mobility Comments: amb with 2 walking sticks, reports frustration with limited progress in mobility ADLs Comments: MOD I in ADL, increased time/effort, MOD I IADL     Hand Dominance   Dominant Hand: Right    Extremity/Trunk Assessment   Upper Extremity Assessment Upper Extremity Assessment: Overall WFL for tasks assessed    Lower Extremity Assessment Lower Extremity Assessment: Generalized weakness;Defer to PT evaluation       Communication   Communication: No difficulties  Cognition                                                General Comments      Exercises General Exercises - Lower Extremity Long Arc Quad: AAROM, Both, 10 reps, Seated Other Exercises Other Exercises: sustained standing at recliner 1x30sec; 1x90sec after RW adjustment (pt only tolerating TDWB)   Assessment/Plan    PT Assessment Patient needs continued PT services  PT Problem List Decreased strength;Decreased range of motion;Decreased activity tolerance;Decreased balance;Decreased mobility;Decreased coordination;Decreased safety awareness;Decreased knowledge of precautions       PT Treatment Interventions DME instruction;Gait training;Balance training;Stair training;Functional mobility training;Therapeutic activities;Therapeutic exercise    PT Goals (Current goals can be found in the Care Plan section)  Acute Rehab PT Goals Patient Stated Goal: have better pain control PT Goal Formulation: With patient Time For Goal Achievement: 07/12/22 Potential to Achieve Goals: Fair    Frequency 7X/week     Co-evaluation               AM-PAC PT "6 Clicks" Mobility  Outcome Measure Help needed turning from your back to your side while in a flat bed without using bedrails?: Total Help needed moving from  lying on your back to sitting on the side of a flat bed without using bedrails?: Total Help needed moving to and from a bed to a chair (including a wheelchair)?: A Lot Help needed standing up from a chair using your arms (e.g., wheelchair or bedside chair)?: A Lot Help needed to walk in hospital room?: Total Help needed climbing 3-5 steps with a railing? : Total 6 Click Score: 8    End of Session   Activity Tolerance: Patient limited by pain Patient left: in chair;with call bell/phone within reach;with chair alarm set Nurse Communication: Mobility status PT Visit Diagnosis: Difficulty in walking, not elsewhere classified (R26.2);Other abnormalities of gait and mobility (R26.89)    Time: 8676-7209 PT Time Calculation (min) (ACUTE ONLY): 28 min   Charges:   PT Evaluation $PT Eval Moderate Complexity: 1 Mod PT Treatments $Therapeutic Activity: 8-22 mins      11:17 AM, 06/28/22 [name], PT, DPT Physical Therapist - [facility]  [phone] ([place])    Mariaelena Cade C 06/28/2022, 11:15 AM

## 2022-06-28 NOTE — Progress Notes (Signed)
PROGRESS NOTE    Andrew Walsh  TGG:[id number] DOB: Dec 07, 1940 DOA: 06/27/2022 PCP: Adin Hector, MD   Assessment & Plan:   Principal Problem:   Closed left femoral fracture ([place]) Active Problems:   History of prostate cancer   Hereditary hemochromatosis ([place])   HLD (hyperlipidemia)   Lumbar stenosis   Left hip pain  Assessment and Plan: Closed left femoral fracture: no surgical intervention as per ortho surg. PT/OT consulted. Norco, morphine prn for pain   HLD: continue on statin    Hereditary hemochromatosis: continue periodic phlebotomy sessions with IV fluid.  Macrocytic anemia: will check B12, folate levels   DVT prophylaxis: lovenox  Code Status: full  Family Communication: called pt's wife, Cyndee Brightly, no answer Disposition Plan: likely d/c to SNF   Level of care: Med-Surg  Status is: Inpatient Remains inpatient appropriate because: severity of illness    Consultants:  Ortho surg   Procedures:   Antimicrobials:    Subjective: Pt c/o left leg pain   Objective: Vitals:   06/27/22 1951 06/28/22 0010 06/28/22 0339 06/28/22 0808  BP: 139/88 (!) 141/73 (!) 162/86 (!) 159/76  Pulse: (!) 53 (!) 56 (!) 53 (!) 50  Resp: '19 18 20 17  '$ Temp: 97.9 F (36.6 C) 98 F (36.7 C) 97.9 F (36.6 C) (!) 97.4 F (36.3 C)  TempSrc: Oral Oral Oral Oral  SpO2: 99% 100% 100% 100%  Weight:      Height:        Intake/Output Summary (Last 24 hours) at 06/28/2022 0833 Last data filed at 06/28/2022 0622 Gross per 24 hour  Intake 1031.64 ml  Output 725 ml  Net 306.64 ml   Filed Weights   06/27/22 1410  Weight: 73.4 kg    Examination:  General exam: Appears calm and comfortable  Respiratory system: Clear to auscultation. Respiratory effort normal. Cardiovascular system: S1 & S2+. No rubs, gallops or clicks.  Gastrointestinal system: Abdomen is nondistended, soft and nontender. Normal bowel sounds heard. Central nervous system: Alert and oriented. Moves all  extremities  Psychiatry: Judgement and insight appear normal. Flat mood and affect    Data Reviewed: I have personally reviewed following labs and imaging studies  CBC: Recent Labs  Lab 06/27/22 1530 06/28/22 0511  WBC 6.6 5.7  NEUTROABS 5.0  --   HGB 12.3* 11.6*  HCT 36.0* 33.6*  MCV 102.3* 102.4*  PLT 216 703   Basic Metabolic Panel: Recent Labs  Lab 06/27/22 1530 06/28/22 0511  NA 133* 135  K 4.4 3.9  CL 105 106  CO2 23 22  GLUCOSE 111* 106*  BUN 18 14  CREATININE 0.76 0.61  CALCIUM 8.8* 8.9   GFR: Estimated Creatinine Clearance: 66.6 mL/min (by C-G formula based on SCr of 0.61 mg/dL). Liver Function Tests: No results for input(s): "AST", "ALT", "ALKPHOS", "BILITOT", "PROT", "ALBUMIN" in the last 168 hours. No results for input(s): "LIPASE", "AMYLASE" in the last 168 hours. No results for input(s): "AMMONIA" in the last 168 hours. Coagulation Profile: No results for input(s): "INR", "PROTIME" in the last 168 hours. Cardiac Enzymes: No results for input(s): "CKTOTAL", "CKMB", "CKMBINDEX", "TROPONINI" in the last 168 hours. BNP (last 3 results) No results for input(s): "PROBNP" in the last 8760 hours. HbA1C: No results for input(s): "HGBA1C" in the last 72 hours. CBG: No results for input(s): "GLUCAP" in the last 168 hours. Lipid Profile: No results for input(s): "CHOL", "HDL", "LDLCALC", "TRIG", "CHOLHDL", "LDLDIRECT" in the last 72 hours. Thyroid Function Tests: No  results for input(s): "TSH", "T4TOTAL", "FREET4", "T3FREE", "THYROIDAB" in the last 72 hours. Anemia Panel: No results for input(s): "VITAMINB12", "FOLATE", "FERRITIN", "TIBC", "IRON", "RETICCTPCT" in the last 72 hours. Sepsis Labs: No results for input(s): "PROCALCITON", "LATICACIDVEN" in the last 168 hours.  No results found for this or any previous visit (from the past 240 hour(s)).       Radiology Studies: Chest Portable 1 View  Result Date: 06/27/2022 CLINICAL DATA:  Frequent  falls, left hip fracture EXAM: PORTABLE CHEST 1 VIEW COMPARISON:  None Available. FINDINGS: The heart size and mediastinal contours are within normal limits given AP technique and low lung volumes. No focal pulmonary opacity. No pleural effusion or pneumothorax. No acute osseous abnormality. IMPRESSION: No acute cardiopulmonary process. Electronically Signed   By: Merilyn Baba M.D.   On: 06/27/2022 16:12   DG Hip Unilat With Pelvis 2-3 Views Left  Result Date: 06/27/2022 CLINICAL DATA:  Fall. EXAM: DG HIP (WITH OR WITHOUT PELVIS) 2-3V LEFT COMPARISON:  None Available. FINDINGS: Bilateral hip replacement in satisfactory position and alignment. Fracture proximal left femur involving the proximal diaphysis. Mild displacement. IMPRESSION: Mildly displaced fracture proximal femoral diaphysis. Bilateral hip replacement Electronically Signed   By: Franchot Gallo M.D.   On: 06/27/2022 14:46        Scheduled Meds: Continuous Infusions:  sodium chloride 100 mL/hr at 06/27/22 1950     LOS: 1 day    Time spent: 35 mins    Wyvonnia Dusky, MD [facility] Pager [phone]  If 7PM-7AM, please contact night-coverage [email or web address] 06/28/2022, 8:33 AM

## 2022-06-28 NOTE — Progress Notes (Signed)
Physical Therapy Treatment Patient Details Name: Andrew Walsh MRN: [id number] DOB: Nov 20, 1940 Today's Date: 06/28/2022   History of Present Illness Pt is an 81 year old male admitted with minimally displaced greater trochanteric fracture around what appears to be a well-positioned total hip arthroplasty which shows no evidence of loosening after fall at home; PMH significant for , hereditary hemochromatosis, requiring periodic phlebotomy sessions with IV fluid, lumbar stenosis status post lumbar decompression    PT Comments    Pt still in recliner, feet position down for comfort, has been experimenting with segmental A/ROM to determine his tolerance. Continued to work on independence/tolerance to rising to standing and sustained standing. PT has incredibly poor tolerance for any weightbearing >TDWB. PT remains incredibly motivated to partake in any amount of mobility that he can tolerate. Pt assisted back to bed at [facility]. Despite determination, he is bummed that his pain is so arresting.     Recommendations for follow up therapy are one component of a multi-disciplinary discharge planning process, led by the attending physician.  Recommendations may be updated based on patient status, additional functional criteria and insurance authorization.  Follow Up Recommendations  Skilled nursing-short term rehab (<3 hours/day) Can patient physically be transported by private vehicle: No   Assistance Recommended at Discharge Set up Supervision/Assistance  Patient can return home with the following Two people to help with walking and/or transfers;Help with stairs or ramp for entrance;Assistance with cooking/housework;Direct supervision/assist for financial management   Equipment Recommendations       Recommendations for Other Services       Precautions / Restrictions Precautions Precautions: Fall Restrictions LLE Weight Bearing: Partial weight bearing     Mobility  Bed Mobility Overal  bed mobility: Needs Assistance Bed Mobility: Sit to Supine       Sit to supine: Mod assist   General bed mobility comments: in recliner    Transfers Overall transfer level: Needs assistance Equipment used: Rolling walker (2 wheels) Transfers: Sit to/from Stand, Bed to chair/wheelchair/BSC Sit to Stand: Min assist     Squat pivot transfers: Max assist, +2 safety/equipment     General transfer comment: max effort, multiple attempts; improved with pillow added to elevation.    Ambulation/Gait Ambulation/Gait assistance:  (several attempts, largely unsuccessful due to wevere pain in loading)                 Stairs             Wheelchair Mobility    Modified Rankin (Stroke Patients Only)       Balance                                            Cognition                                                Exercises General Exercises - Lower Extremity Long Arc Quad: AAROM, Both, 10 reps, Seated Other Exercises Other Exercises: STS from recliner + pillow RW x3 Other Exercises: sustained standing x5 minutes with lateral weight shifting, attempts to slide Left foot forward/backward; attempts to lift Rt foot from floor    General Comments        Pertinent Vitals/Pain Pain Assessment Pain Assessment: Faces Pain Score:  10-Worst pain ever Pain Location: L hip Pain Descriptors / Indicators: Aching, Discomfort Pain Intervention(s): Limited activity within patient's tolerance, Premedicated before session, Monitored during session, Repositioned    Home Living                          Prior Function            PT Goals (current goals can now be found in the care plan section) Acute Rehab PT Goals Patient Stated Goal: have better pain control PT Goal Formulation: With patient Time For Goal Achievement: 07/12/22 Potential to Achieve Goals: Fair Progress towards PT goals: Progressing toward goals     Frequency    7X/week      PT Plan Current plan remains appropriate    Co-evaluation              AM-PAC PT "6 Clicks" Mobility   Outcome Measure  Help needed turning from your back to your side while in a flat bed without using bedrails?: Total Help needed moving from lying on your back to sitting on the side of a flat bed without using bedrails?: Total Help needed moving to and from a bed to a chair (including a wheelchair)?: A Lot Help needed standing up from a chair using your arms (e.g., wheelchair or bedside chair)?: A Lot Help needed to walk in hospital room?: Total Help needed climbing 3-5 steps with a railing? : Total 6 Click Score: 8    End of Session   Activity Tolerance: Patient limited by pain Patient left: in bed;with call bell/phone within reach Nurse Communication: Mobility status PT Visit Diagnosis: Difficulty in walking, not elsewhere classified (R26.2);Other abnormalities of gait and mobility (R26.89)     Time: 7001-7494 PT Time Calculation (min) (ACUTE ONLY): 23 min  Charges:  $Therapeutic Activity: 23-37 mins                    4:07 PM, 06/28/22 [name], PT, DPT Physical Therapist - [facility]  [phone] ([place])    Aiysha Jillson C 06/28/2022, 4:04 PM

## 2022-06-28 NOTE — Progress Notes (Signed)
Initial Nutrition Assessment  DOCUMENTATION CODES:   Not applicable  INTERVENTION:  Encourage adequate PO intake with emphasis on protein to help with muscle maintenance Ensure Enlive po BID, each supplement provides 350 kcal and 20 grams of protein.  NUTRITION DIAGNOSIS:   Increased nutrient needs related to hip fracture as evidenced by estimated needs.  GOAL:   Patient will meet greater than or equal to 90% of their needs  MONITOR:   PO intake, Supplement acceptance, Labs, Weight trends  REASON FOR ASSESSMENT:   Consult Hip fracture protocol  ASSESSMENT:   Pt admitted after a fall leading to nondisplaced L femoral fracture. PMH significant for HLD, hereditary hemochromatosis, requiring periodic phlebotomy sessions with IV fluid, lumbar stenosis s/p lumbar decompression.   Per Ortho, no surgical interventions planned at this time.   Pt sitting up in chair at time of visit. He endorses ongoing pain r/t to his hip. He states that his PO intake has been consistent for many years and denies changes to his PO intake. Today he had an egg and ham omelette for breakfast. He did not eat lunch as he was not hungry but plans to have dinner. He prefers to remain close to his home meal regimen.   His typically dietary recall includes eggs or cereal with a cup of milk and fruit for breakfast, half a sandwich for lunch, and dinner includes stir fry, fajitas, pork or chicken with vegetables. He has dessert only on Saturday's and drinks ~2 glasses of wine daily. Recently he has started drinking 1 Ensure 4-5 times per week and is agreeable to add these to his regimen during admission.   He was previously very active and was going to the gym daily until his lumbar decompression about 4 months ago. He has not been able to exercise how he used to and hopes to get back to this.   He reports that his weight has been stable for many years and denies recent weight loss. Reviewed weight history. His  weight has decreased from 74.8 kg to 69.9 kg between 05/05-06/21 which appears to be right around the time of his lumbar decompression. This is a 6.5% weight loss which is clinically significant for time frame. However his weight now appears to be back up to 73.4 kg.   Medications and labs reviewed.  NUTRITION - FOCUSED PHYSICAL EXAM:  Flowsheet Row Most Recent Value  Orbital Region No depletion  Upper Arm Region Moderate depletion  Thoracic and Lumbar Region No depletion  Buccal Region No depletion  Temple Region Mild depletion  Clavicle Bone Region No depletion  Clavicle and Acromion Bone Region No depletion  Scapular Bone Region No depletion  Dorsal Hand Mild depletion  Patellar Region Mild depletion  Anterior Thigh Region Mild depletion  Posterior Calf Region Moderate depletion  Edema (RD Assessment) None  Hair Reviewed  Eyes Reviewed  Mouth Reviewed  Skin Reviewed  Nails Reviewed      Diet Order:   Diet Order             Diet regular Room service appropriate? Yes; Fluid consistency: Thin  Diet effective now                   EDUCATION NEEDS:   Education needs have been addressed  Skin:  Skin Assessment: Reviewed RN Assessment  Last BM:  10/16  Height:   Ht Readings from Last 1 Encounters:  06/27/22 '5\' 7"'$  (1.702 m)    Weight:   Wt Readings  from Last 1 Encounters:  06/27/22 73.4 kg   BMI:  Body mass index is 25.34 kg/m.  Estimated Nutritional Needs:   Kcal:  1800-2000  Protein:  90-105g  Fluid:  >/=1.8L  Clayborne Dana, RDN, LDN Clinical Nutrition

## 2022-06-28 NOTE — NC FL2 (Signed)
  [place] LEVEL OF CARE SCREENING TOOL     IDENTIFICATION  Patient Name: Andrew Walsh Birthdate: [birth year] Sex: male Admission Date (Current Location): 06/27/2022  [facility] and [place] Number:  Engineering geologist and Address:  [facility], [address], , [place] [address]      Provider Number: [id number]  Attending Physician Name and Address:  Wyvonnia Dusky, MD  Relative Name and Phone Number:  Cyndee Brightly [phone]    Current Level of Care: Hospital Recommended Level of Care: [place] Prior Approval Number:    Date Approved/Denied:   PASRR Number: [id number]  Discharge Plan: SNF    Current Diagnoses: Patient Active Problem List   Diagnosis Date Noted   Closed left femoral fracture ([place]) 06/27/2022   Left hip pain 06/27/2022   Alcohol use 06/06/2022   History of prostate cancer 06/06/2022   Lumbar stenosis 01/13/2022   Hereditary hemochromatosis ([place]) 11/03/2015   HLD (hyperlipidemia) 11/03/2015   Ankle arthropathy 02/24/2014   Localized, primary osteoarthritis of ankle or foot 02/24/2014   Arthritis of foot, degenerative 10/03/2012    Orientation RESPIRATION BLADDER Height & Weight     Self, Time, Situation, Place  Normal Continent Weight: 73.4 kg Height:  '5\' 7"'$  (170.2 cm)  BEHAVIORAL SYMPTOMS/MOOD NEUROLOGICAL BOWEL NUTRITION STATUS      Continent Diet (see dc summary)  AMBULATORY STATUS COMMUNICATION OF NEEDS Skin   Extensive Assist Verbally Normal, Surgical wounds                       Personal Care Assistance Level of Assistance  Bathing, Feeding, Dressing Bathing Assistance: Limited assistance Feeding assistance: Independent Dressing Assistance: Limited assistance     Functional Limitations Info             Christopher  PT (By licensed PT), OT (By licensed OT)     PT Frequency: 5 times per week OT Frequency: 5 times per week             Contractures Contractures Info: Not present    Additional Factors Info  Code Status, Allergies Code Status Info: full code Allergies Info: nkda           Current Medications (06/28/2022):  This is the current hospital active medication list Current Facility-Administered Medications  Medication Dose Route Frequency Provider Last Rate Last Admin   enoxaparin (LOVENOX) injection 40 mg  40 mg Subcutaneous Daily Wyvonnia Dusky, MD   40 mg at 06/28/22 1615   [START ON 06/29/2022] feeding supplement (ENSURE ENLIVE / ENSURE PLUS) liquid 237 mL  237 mL Oral BID BM Wyvonnia Dusky, MD       HYDROcodone-acetaminophen (NORCO/VICODIN) 5-325 MG per tablet 1-2 tablet  1-2 tablet Oral Q6H PRN Cox, Amy N, DO   2 tablet at 06/28/22 1014   morphine (PF) 2 MG/ML injection 2 mg  2 mg Intravenous Q2H PRN Wyvonnia Dusky, MD       Oral care mouth rinse  15 mL Mouth Rinse PRN Cox, Amy N, DO       senna-docusate (Senokot-S) tablet 1 tablet  1 tablet Oral QHS PRN Cox, Amy N, DO         Discharge Medications: Please see discharge summary for a list of discharge medications.  Relevant Imaging Results:  Relevant Lab Results:   Additional Information SS# [id number]  Conception Oms, RN

## 2022-06-29 DIAGNOSIS — D539 Nutritional anemia, unspecified: Secondary | ICD-10-CM | POA: Diagnosis not present

## 2022-06-29 DIAGNOSIS — S7292XA Unspecified fracture of left femur, initial encounter for closed fracture: Secondary | ICD-10-CM | POA: Diagnosis not present

## 2022-06-29 LAB — BASIC METABOLIC PANEL
Anion gap: 4 — ABNORMAL LOW (ref 5–15)
BUN: 13 mg/dL (ref 8–23)
CO2: 23 mmol/L (ref 22–32)
Calcium: 8.4 mg/dL — ABNORMAL LOW (ref 8.9–10.3)
Chloride: 109 mmol/L (ref 98–111)
Creatinine, Ser: 0.55 mg/dL — ABNORMAL LOW (ref 0.61–1.24)
GFR, Estimated: >60 mL/min (ref >60)
Glucose, Bld: 99 mg/dL (ref 70–99)
Potassium: 3.9 mmol/L (ref 3.5–5.1)
Sodium: 136 mmol/L (ref 135–145)

## 2022-06-29 LAB — CBC
HCT: 32.8 % — ABNORMAL LOW (ref 39.0–52.0)
Hemoglobin: 11 g/dL — ABNORMAL LOW (ref 13.0–17.0)
MCH: 34.5 pg — ABNORMAL HIGH (ref 26.0–34.0)
MCHC: 33.5 g/dL (ref 30.0–36.0)
MCV: 102.8 fL — ABNORMAL HIGH (ref 80.0–100.0)
Platelets: 201 10*3/uL (ref 150–400)
RBC: 3.19 MIL/uL — ABNORMAL LOW (ref 4.22–5.81)
RDW: 12.5 % (ref 11.5–15.5)
WBC: 6 10*3/uL (ref 4.0–10.5)
nRBC: 0 % (ref 0.0–0.2)

## 2022-06-29 LAB — VITAMIN B12: Vitamin B-12: 467 pg/mL (ref 180–914)

## 2022-06-29 LAB — FOLATE: Folate: 6.4 ng/mL (ref >5.9)

## 2022-06-29 MED ORDER — HYDROCODONE-ACETAMINOPHEN 5-325 MG PO TABS
2.0000 | ORAL_TABLET | Freq: Four times a day (QID) | ORAL | Status: DC | PRN
  Administered 2022-06-29 – 2022-06-30 (×3): 2 via ORAL
  Filled 2022-06-29 (×3): qty 2

## 2022-06-29 NOTE — Progress Notes (Signed)
Andrew Walsh is well-known to me.  I stop by to say hello.  He continues to have mobility issues and balance issues.  He unfortunately fell and fractured his left femur.  After his recuperation, I would like to reevaluate him in my clinic.

## 2022-06-29 NOTE — TOC Progression Note (Signed)
Transition of Care [facility]) - Progression Note    Patient Details  Name: Andrew Walsh MRN: [id number] Date of Birth: [birth year]  Transition of Care [facility]) CM/SW [place], RN Phone Number: 06/29/2022, 3:12 PM  Clinical Narrative:     Submitted for Ins approval Ins approved to go to [facility] 10/20-10/24 Ref ID number [id number]  Expected Discharge Plan: [place] Barriers to Discharge: Insurance Authorization  Expected Discharge Plan and Services Expected Discharge Plan: [place]                                               Social Determinants of Health (SDOH) Interventions    Readmission Risk Interventions     No data to display

## 2022-06-29 NOTE — Progress Notes (Signed)
Physical Therapy Treatment Patient Details Name: Andrew Walsh MRN: [id number] DOB: 11-09-1940 Today's Date: 06/29/2022   History of Present Illness Pt is an 81 year old male admitted with minimally displaced greater trochanteric fracture around what appears to be a well-positioned total hip arthroplasty which shows no evidence of loosening after fall at home; PMH significant for , hereditary hemochromatosis, requiring periodic phlebotomy sessions with IV fluid, lumbar stenosis status post lumbar decompression    PT Comments    Pt in bed on arrival, midcycle for analgesics. Introduced bed level exercises and education on self assistance with a bed sheet. Handout provided after session. Pt remains in significant pain, but he is driven to participate in the highest tolerable way.We coordinate with RN to obtain additional analgesia prior to attempting OOB to chair, curious if it improves tolerance to mobility, independence. Pt returns after medication received and pt is pleased to report he is already up to chair. He reports some additional help from secondary analgesia, but reports still really limited by pain with transfer. Dropped off a gait belt for exercises later tonight.      Recommendations for follow up therapy are one component of a multi-disciplinary discharge planning process, led by the attending physician.  Recommendations may be updated based on patient status, additional functional criteria and insurance authorization.  Follow Up Recommendations  Skilled nursing-short term rehab (<3 hours/day) Can patient physically be transported by private vehicle: No   Assistance Recommended at Discharge Set up Supervision/Assistance  Patient can return home with the following Two people to help with walking and/or transfers;Help with stairs or ramp for entrance;Assistance with cooking/housework;Direct supervision/assist for financial management   Equipment Recommendations        Recommendations for Other Services       Precautions / Restrictions       Mobility  Bed Mobility                    Transfers                        Ambulation/Gait                   Stairs             Wheelchair Mobility    Modified Rankin (Stroke Patients Only)       Balance                                            Cognition                                                Exercises General Exercises - Lower Extremity Ankle Circles/Pumps: AROM, Left, 5 reps, Supine Short Arc Quad: AAROM, Left, 15 reps, Supine Heel Slides: AAROM, Left, 15 reps, Supine Hip ABduction/ADduction: AAROM, Left, 15 reps, Supine Mini-Sqauts: Strengthening, Right, 10 reps, Supine Other Exercises Other Exercises: bridging with minA Left foot stabilization 1x12    General Comments        Pertinent Vitals/Pain      Home Living                          Prior  Function            PT Goals (current goals can now be found in the care plan section) Acute Rehab PT Goals Patient Stated Goal: have better pain control PT Goal Formulation: With patient Time For Goal Achievement: 07/12/22 Potential to Achieve Goals: Fair Progress towards PT goals: Progressing toward goals    Frequency    7X/week      PT Plan Current plan remains appropriate    Co-evaluation              AM-PAC PT "6 Clicks" Mobility   Outcome Measure  Help needed turning from your back to your side while in a flat bed without using bedrails?: Total Help needed moving from lying on your back to sitting on the side of a flat bed without using bedrails?: Total Help needed moving to and from a bed to a chair (including a wheelchair)?: A Lot Help needed standing up from a chair using your arms (e.g., wheelchair or bedside chair)?: A Lot Help needed to walk in hospital room?: Total Help needed climbing 3-5 steps with a  railing? : Total 6 Click Score: 8    End of Session   Activity Tolerance: Patient limited by pain;Patient tolerated treatment well Patient left: in bed;with call bell/phone within reach Nurse Communication: Mobility status PT Visit Diagnosis: Difficulty in walking, not elsewhere classified (R26.2);Other abnormalities of gait and mobility (R26.89)     Time: 1100-1126 PT Time Calculation (min) (ACUTE ONLY): 26 min  Charges:  $Therapeutic Exercise: 23-37 mins                    1:09 PM, 06/29/22 [name], PT, DPT Physical Therapist - [facility]  [phone] ([place])    Osbaldo Mark C 06/29/2022, 1:06 PM

## 2022-06-29 NOTE — Progress Notes (Signed)
Occupational Therapy Treatment Patient Details Name: Andrew Walsh MRN: [id number] DOB: 11/28/1940 Today's Date: 06/29/2022   History of present illness Pt is an 81 year old male admitted with minimally displaced greater trochanteric fracture around what appears to be a well-positioned total hip arthroplasty which shows no evidence of loosening after fall at home; PMH significant for , hereditary hemochromatosis, requiring periodic phlebotomy sessions with IV fluid, lumbar stenosis status post lumbar decompression   OT comments  Chart reviewed, pt greeted in bed agreeable to OT tx session. Tx session targeted improving activity tolerance in preparation for future ADL tasks/ functional transfers. Improvements noted in STS, requiring MIN-MOD A 5 attempts, able to maintain standing for approx 30 seconds each attempt. Pt continues to perform below PLOF, discharge recommendation remains appropriate. OT will continue to follow acutely. Pt is left in bedside chair, wife present. RN aware of pt status.    Recommendations for follow up therapy are one component of a multi-disciplinary discharge planning process, led by the attending physician.  Recommendations may be updated based on patient status, additional functional criteria and insurance authorization.    Follow Up Recommendations  Skilled nursing-short term rehab (<3 hours/day)    Assistance Recommended at Discharge Frequent or constant Supervision/Assistance  Patient can return home with the following  A lot of help with walking and/or transfers;A lot of help with bathing/dressing/bathroom   Equipment Recommendations  Other (comment) (per next venue of care)    Recommendations for Other Services      Precautions / Restrictions Precautions Precautions: Fall Restrictions Weight Bearing Restrictions: Yes LLE Weight Bearing: Partial weight bearing       Mobility Bed Mobility               General bed mobility comments: NT in  recliner pre/post session    Transfers Overall transfer level: Needs assistance Equipment used: Rolling walker (2 wheels) Transfers: Sit to/from Stand Sit to Stand: Min assist, Mod assist           General transfer comment: 5 attempts with frequent vcs for technique/safety with RW in prep for future ADL tasks/functional transfers. Pt tolerates standing for approx 30seconds each time, good adherence to [place] however treats more like TTWBing due to pain     Balance Overall balance assessment: Needs assistance Sitting-balance support: Feet supported Sitting balance-Leahy Scale: Good     Standing balance support: Bilateral upper extremity supported, Reliant on assistive device for balance, During functional activity Standing balance-Leahy Scale: Poor                             ADL either performed or assessed with clinical judgement   ADL Overall ADL's : Needs assistance/impaired Eating/Feeding: Set up;Sitting   Grooming: Set up;Sitting               Lower Body Dressing: Maximal assistance                      Extremity/Trunk Assessment              Vision       Perception     Praxis      Cognition Arousal/Alertness: Awake/alert Behavior During Therapy: WFL for tasks assessed/performed Overall Cognitive Status: Within Functional Limits for tasks assessed  Exercises      Shoulder Instructions       General Comments      Pertinent Vitals/ Pain       Pain Assessment Pain Assessment: 0-10 Pain Score: 10-Worst pain ever Pain Location: L hip with mobility Pain Descriptors / Indicators: Aching, Discomfort, Grimacing Pain Intervention(s): Limited activity within patient's tolerance, Premedicated before session, Patient requesting pain meds-RN notified, Monitored during session, Repositioned  Home Living                                          Prior  Functioning/Environment              Frequency  Min 2X/week        Progress Toward Goals  OT Goals(current goals can now be found in the care plan section)  Progress towards OT goals: Progressing toward goals     Plan Discharge plan remains appropriate    Co-evaluation                 AM-PAC OT "6 Clicks" Daily Activity     Outcome Measure   Help from another person eating meals?: None Help from another person taking care of personal grooming?: None Help from another person toileting, which includes using toliet, bedpan, or urinal?: A Lot   Help from another person to put on and taking off regular upper body clothing?: A Little Help from another person to put on and taking off regular lower body clothing?: A Lot 6 Click Score: 15    End of Session Equipment Utilized During Treatment: Rolling walker (2 wheels)  OT Visit Diagnosis: Unsteadiness on feet (R26.81);History of falling (Z91.81)   Activity Tolerance Patient tolerated treatment well   Patient Left in chair;with call bell/phone within reach;with family/visitor present   Nurse Communication Patient requests pain meds;Mobility status        Time: 4481-8563 OT Time Calculation (min): 18 min  Charges: OT General Charges $OT Visit: 1 Visit OT Treatments $Therapeutic Activity: 8-22 mins  Shanon Payor, OTD OTR/L  06/29/22, 3:52 PM

## 2022-06-29 NOTE — Progress Notes (Signed)
Patient ID: Andrew Walsh, male   DOB: August 21, 1941, 81 y.o.   MRN: [id number]  Subjective: The patient notes little change in his symptoms since his day of admission.  He continues to note significant pain with any attempted weightbearing on the leg.  He was able to sit in a chair for a while, but notes that the transfer from the bed to the chair was "excruciating.  He has no new complaints.   Objective: Vital signs in last 24 hours: Temp:  [97 F (36.1 C)-98 F (36.7 C)] 98 F (36.7 C) (10/19 0805) Pulse Rate:  [58-68] 68 (10/19 1547) Resp:  [16-20] 16 (10/19 0805) BP: (130-158)/(67-78) 158/78 (10/19 0805) SpO2:  [98 %-99 %] 98 % (10/19 1547)  Intake/Output from previous day: 10/18 0701 - 10/19 0700 In: 360 [P.O.:360] Out: 1250 [Urine:1250] Intake/Output this shift: Total I/O In: -  Out: 400 [Urine:400]  Recent Labs    06/27/22 1530 06/28/22 0511 06/29/22 0446  HGB 12.3* 11.6* 11.0*   Recent Labs    06/28/22 0511 06/29/22 0446  WBC 5.7 6.0  RBC 3.28* 3.19*  HCT 33.6* 32.8*  PLT 210 201   Recent Labs    06/28/22 0511 06/29/22 0446  NA 135 136  K 3.9 3.9  CL 106 109  CO2 22 23  BUN 14 13  CREATININE 0.61 0.55*  GLUCOSE 106* 99  CALCIUM 8.9 8.4*   No results for input(s): "LABPT", "INR" in the last 72 hours.  Physical Exam: Orthopedic examination again is limited to the left hip and lower extremity.  The findings are as described in his note from 2 days ago.  He again has moderate tenderness to palpation over the lateral aspect of the hip and more severe pain with attempted active motion of the hip or leg.  He remains grossly neurovascularly intact to the left lower extremity and foot, demonstrating the ability to dorsiflex and plantarflex his toes and ankle.  Sensation is intact to light touch to all distributions.  He has good capillary refill to his foot.  Assessment: Essentially nondisplaced periprosthetic left greater trochanteric fracture.  Plan: The  treatment options have been reviewed with the patient.  He will continue be mobilized with physical therapy as symptoms permit, and may continue to receive pain medication as deemed appropriate medically as necessary.  Marshall Cork Aariona Momon 06/29/2022, 4:55 PM

## 2022-06-29 NOTE — Progress Notes (Signed)
PROGRESS NOTE    KHOEN GENET  MGQ:[id number] DOB: 07/17/1941 DOA: 06/27/2022 PCP: Adin Hector, MD   Assessment & Plan:   Principal Problem:   Closed left femoral fracture ([place]) Active Problems:   History of prostate cancer   Hereditary hemochromatosis ([place])   HLD (hyperlipidemia)   Lumbar stenosis   Left hip pain  Assessment and Plan: Closed left femoral fracture: no surgical intervention as per ortho surg. Norco, morphine prn for pain. PT/OT recs SNF. CM is working on SNF placement   HLD: continue on statin   Hereditary hemochromatosis: continue periodic phlebotomy sessions with IV fluid.  Macrocytic anemia: B12, folate are both WNL. No need for a transfusion currently    DVT prophylaxis: lovenox  Code Status: full  Family Communication: called pt's wife, Cyndee Brightly, no answer  Disposition Plan: likely d/c to SNF   Level of care: Med-Surg  Status is: Inpatient Remains inpatient appropriate because: severity of illness    Consultants:  Ortho surg   Procedures:   Antimicrobials:    Subjective: Pt c/o left leg pain still.  Objective: Vitals:   06/28/22 0808 06/28/22 1020 06/28/22 1536 06/28/22 2314  BP: (!) 159/76  134/70 130/67  Pulse: (!) 50 (!) 58 (!) 51 60  Resp: '17  17 20  '$ Temp: (!) 97.4 F (36.3 C)  97.6 F (36.4 C) (!) 97 F (36.1 C)  TempSrc: Oral  Oral   SpO2: 100% 98% 99% 99%  Weight:      Height:        Intake/Output Summary (Last 24 hours) at 06/29/2022 0802 Last data filed at 06/29/2022 0708 Gross per 24 hour  Intake 360 ml  Output 1450 ml  Net -1090 ml   Filed Weights   06/27/22 1410  Weight: 73.4 kg    Examination:  General exam: Appears uncomfortable  Respiratory system: clear breath sounds b/l  Cardiovascular system: S1/S2+. No rubs or gallops Gastrointestinal system: Abd is soft, NT, ND & hypoactive bowel sounds  Central nervous system: Alert and oriented. Moves all extremities  Psychiatry: Judgement and  insight appears abnormal. Flat mood and affect     Data Reviewed: I have personally reviewed following labs and imaging studies  CBC: Recent Labs  Lab 06/27/22 1530 06/28/22 0511 06/29/22 0446  WBC 6.6 5.7 6.0  NEUTROABS 5.0  --   --   HGB 12.3* 11.6* 11.0*  HCT 36.0* 33.6* 32.8*  MCV 102.3* 102.4* 102.8*  PLT 216 210 267   Basic Metabolic Panel: Recent Labs  Lab 06/27/22 1530 06/28/22 0511 06/29/22 0446  NA 133* 135 136  K 4.4 3.9 3.9  CL 105 106 109  CO2 '23 22 23  '$ GLUCOSE 111* 106* 99  BUN '18 14 13  '$ CREATININE 0.76 0.61 0.55*  CALCIUM 8.8* 8.9 8.4*   GFR: Estimated Creatinine Clearance: 66.6 mL/min (A) (by C-G formula based on SCr of 0.55 mg/dL (L)). Liver Function Tests: No results for input(s): "AST", "ALT", "ALKPHOS", "BILITOT", "PROT", "ALBUMIN" in the last 168 hours. No results for input(s): "LIPASE", "AMYLASE" in the last 168 hours. No results for input(s): "AMMONIA" in the last 168 hours. Coagulation Profile: No results for input(s): "INR", "PROTIME" in the last 168 hours. Cardiac Enzymes: No results for input(s): "CKTOTAL", "CKMB", "CKMBINDEX", "TROPONINI" in the last 168 hours. BNP (last 3 results) No results for input(s): "PROBNP" in the last 8760 hours. HbA1C: No results for input(s): "HGBA1C" in the last 72 hours. CBG: No results for input(s): "GLUCAP" in  the last 168 hours. Lipid Profile: No results for input(s): "CHOL", "HDL", "LDLCALC", "TRIG", "CHOLHDL", "LDLDIRECT" in the last 72 hours. Thyroid Function Tests: No results for input(s): "TSH", "T4TOTAL", "FREET4", "T3FREE", "THYROIDAB" in the last 72 hours. Anemia Panel: Recent Labs    06/29/22 0446  FOLATE 6.4   Sepsis Labs: No results for input(s): "PROCALCITON", "LATICACIDVEN" in the last 168 hours.  No results found for this or any previous visit (from the past 240 hour(s)).       Radiology Studies: Chest Portable 1 View  Result Date: 06/27/2022 CLINICAL DATA:  Frequent  falls, left hip fracture EXAM: PORTABLE CHEST 1 VIEW COMPARISON:  None Available. FINDINGS: The heart size and mediastinal contours are within normal limits given AP technique and low lung volumes. No focal pulmonary opacity. No pleural effusion or pneumothorax. No acute osseous abnormality. IMPRESSION: No acute cardiopulmonary process. Electronically Signed   By: Merilyn Baba M.D.   On: 06/27/2022 16:12   DG Hip Unilat With Pelvis 2-3 Views Left  Result Date: 06/27/2022 CLINICAL DATA:  Fall. EXAM: DG HIP (WITH OR WITHOUT PELVIS) 2-3V LEFT COMPARISON:  None Available. FINDINGS: Bilateral hip replacement in satisfactory position and alignment. Fracture proximal left femur involving the proximal diaphysis. Mild displacement. IMPRESSION: Mildly displaced fracture proximal femoral diaphysis. Bilateral hip replacement Electronically Signed   By: Franchot Gallo M.D.   On: 06/27/2022 14:46        Scheduled Meds:  enoxaparin (LOVENOX) injection  40 mg Subcutaneous Daily   feeding supplement  237 mL Oral BID BM   Continuous Infusions:     LOS: 2 days    Time spent: 36 mins    Wyvonnia Dusky, MD [facility] Pager [phone]  If 7PM-7AM, please contact night-coverage [email or web address] 06/29/2022, 8:02 AM

## 2022-06-29 NOTE — TOC Progression Note (Signed)
Transition of Care [facility]) - Progression Note    Patient Details  Name: Andrew Walsh MRN: [id number] Date of Birth: 01-Aug-1941  Transition of Care [facility]) CM/SW [place], RN Phone Number: 06/29/2022, 2:03 PM  Clinical Narrative:    Reviewed the bed offers with the patient He chose [facility] as he lives across the street from there I notified Magda Paganini at [employer] pending      Expected Discharge Plan and Services                                                 Social Determinants of Health ([place]) Interventions    Readmission Risk Interventions     No data to display

## 2022-06-30 ENCOUNTER — Inpatient Hospital Stay: Payer: Medicare HMO

## 2022-06-30 DIAGNOSIS — D539 Nutritional anemia, unspecified: Secondary | ICD-10-CM | POA: Diagnosis not present

## 2022-06-30 DIAGNOSIS — S7292XA Unspecified fracture of left femur, initial encounter for closed fracture: Secondary | ICD-10-CM | POA: Diagnosis not present

## 2022-06-30 LAB — BASIC METABOLIC PANEL
Anion gap: 6 (ref 5–15)
BUN: 11 mg/dL (ref 8–23)
CO2: 24 mmol/L (ref 22–32)
Calcium: 8.7 mg/dL — ABNORMAL LOW (ref 8.9–10.3)
Chloride: 105 mmol/L (ref 98–111)
Creatinine, Ser: 0.57 mg/dL — ABNORMAL LOW (ref 0.61–1.24)
GFR, Estimated: >60 mL/min (ref >60)
Glucose, Bld: 99 mg/dL (ref 70–99)
Potassium: 4 mmol/L (ref 3.5–5.1)
Sodium: 135 mmol/L (ref 135–145)

## 2022-06-30 LAB — CBC
HCT: 31.7 % — ABNORMAL LOW (ref 39.0–52.0)
Hemoglobin: 10.8 g/dL — ABNORMAL LOW (ref 13.0–17.0)
MCH: 34.8 pg — ABNORMAL HIGH (ref 26.0–34.0)
MCHC: 34.1 g/dL (ref 30.0–36.0)
MCV: 102.3 fL — ABNORMAL HIGH (ref 80.0–100.0)
Platelets: 193 10*3/uL (ref 150–400)
RBC: 3.1 MIL/uL — ABNORMAL LOW (ref 4.22–5.81)
RDW: 12.7 % (ref 11.5–15.5)
WBC: 6.2 10*3/uL (ref 4.0–10.5)
nRBC: 0 % (ref 0.0–0.2)

## 2022-06-30 MED ORDER — OXYCODONE-ACETAMINOPHEN 7.5-325 MG PO TABS: 1.0000 | ORAL_TABLET | ORAL | Status: DC | PRN

## 2022-06-30 MED ORDER — HYDROCODONE-ACETAMINOPHEN 5-325 MG PO TABS: 2.0000 | ORAL_TABLET | Freq: Four times a day (QID) | ORAL | 0 refills | Status: DC | PRN

## 2022-06-30 MED ORDER — OXYCODONE-ACETAMINOPHEN 7.5-325 MG PO TABS
2.0000 | ORAL_TABLET | ORAL | Status: DC | PRN
  Administered 2022-06-30 – 2022-07-01 (×2): 2 via ORAL
  Filled 2022-06-30 (×2): qty 2

## 2022-06-30 NOTE — Progress Notes (Incomplete)
PROGRESS NOTE    Andrew Walsh  EPP:[id number] DOB: December 10, 1940 DOA: 06/27/2022 PCP: Adin Hector, MD   Assessment & Plan:   Principal Problem:   Closed left femoral fracture ([place]) Active Problems:   History of prostate cancer   Hereditary hemochromatosis ([place])   HLD (hyperlipidemia)   Lumbar stenosis   Left hip pain  Assessment and Plan: Closed left femoral fracture: no surgical intervention as per ortho surg. Pain was improved yesterday w/ fentanyl patch but today no improvement w/ fentanyl patch today. Fentanyl patch increased and continue on percocet prn. Charge nurse and pt advocate will talk with pt today about not receiving "therapy" and not having his pain controlled. There are complications and consequences to taking multiple narcotics at once including respiratory depression and death. Will need outpatient ortho surg, Dr. Roland Rack, f/u in 5-6 weeks. Ortho surg signed off 07/03/22   HLD: continue on statin    Hereditary hemochromatosis: continue w/ intermittent phlebotomy sessions   Macrocytic anemia: folate, B12 are both WNL. No need for a transfusion currently     DVT prophylaxis: lovenox  Code Status: full  Family Communication:  Disposition Plan: likely d/c to SNF   Level of care: Med-Surg  Status is: Inpatient Remains inpatient appropriate because:  pain is not controlled as per pt     Consultants:  Ortho surg   Procedures:   Antimicrobials:    Subjective: Pt is angry, agitated and frustrated   Objective: Vitals:   07/02/22 1553 07/02/22 2135 07/03/22 0432 07/03/22 0849  BP: 101/66 127/69 (!) 142/80 (!) 142/81  Pulse: 62 71 (!) 53 (!) 51  Resp: '16 19 16 17  '$ Temp: 97.7 F (36.5 C) 97.9 F (36.6 C) 97.6 F (36.4 C) 97.9 F (36.6 C)  TempSrc:      SpO2: 96% 99% 99% 100%  Weight:      Height:        Intake/Output Summary (Last 24 hours) at 07/03/2022 1431 Last data filed at 07/03/2022 1005 Gross per 24 hour  Intake 120 ml  Output  350 ml  Net -230 ml   Filed Weights   06/27/22 1410  Weight: 73.4 kg    Examination:  General exam: Appears agitated and frustrated  Respiratory system:  clear breath sounds b/l  Cardiovascular system: S1 & S2+. No rubs or clicks   Gastrointestinal system: Abd is soft, NT, ND & hypoactive bowel sounds  Central nervous system: alert and oriented. Moves all extremities  Psychiatry: judgement and insight is at baseline. Agitated and frustrated mood and affect     Data Reviewed: I have personally reviewed following labs and imaging studies  CBC: Recent Labs  Lab 06/27/22 1530 06/28/22 0511 06/29/22 0446 06/30/22 0648 07/01/22 0510 07/02/22 0451 07/03/22 0328  WBC 6.6   < > 6.0 6.2 6.0 4.7 4.2  NEUTROABS 5.0  --   --   --   --   --   --   HGB 12.3*   < > 11.0* 10.8* 10.6* 11.1* 10.8*  HCT 36.0*   < > 32.8* 31.7* 30.1* 32.4* 31.7*  MCV 102.3*   < > 102.8* 102.3* 100.0 100.6* 101.6*  PLT 216   < > 201 193 210 215 221   < > = values in this interval not displayed.   Basic Metabolic Panel: Recent Labs  Lab 06/29/22 0446 06/30/22 0648 07/01/22 0510 07/02/22 0451 07/03/22 0328  NA 136 135 132* 133* 135  K 3.9 4.0 3.9 4.0 4.1  CL 109 105 100 101 104  CO2 '23 24 23 24 24  '$ GLUCOSE 99 99 98 95 97  BUN '13 11 14 16 21  '$ CREATININE 0.55* 0.57* 0.67 0.61 0.67  CALCIUM 8.4* 8.7* 8.6* 8.6* 8.9   GFR: Estimated Creatinine Clearance: 66.6 mL/min (by C-G formula based on SCr of 0.67 mg/dL). Liver Function Tests: No results for input(s): "AST", "ALT", "ALKPHOS", "BILITOT", "PROT", "ALBUMIN" in the last 168 hours. No results for input(s): "LIPASE", "AMYLASE" in the last 168 hours. No results for input(s): "AMMONIA" in the last 168 hours. Coagulation Profile: No results for input(s): "INR", "PROTIME" in the last 168 hours. Cardiac Enzymes: No results for input(s): "CKTOTAL", "CKMB", "CKMBINDEX", "TROPONINI" in the last 168 hours. BNP (last 3 results) No results for input(s):  "PROBNP" in the last 8760 hours. HbA1C: No results for input(s): "HGBA1C" in the last 72 hours. CBG: No results for input(s): "GLUCAP" in the last 168 hours. Lipid Profile: No results for input(s): "CHOL", "HDL", "LDLCALC", "TRIG", "CHOLHDL", "LDLDIRECT" in the last 72 hours. Thyroid Function Tests: No results for input(s): "TSH", "T4TOTAL", "FREET4", "T3FREE", "THYROIDAB" in the last 72 hours. Anemia Panel: No results for input(s): "VITAMINB12", "FOLATE", "FERRITIN", "TIBC", "IRON", "RETICCTPCT" in the last 72 hours.  Sepsis Labs: No results for input(s): "PROCALCITON", "LATICACIDVEN" in the last 168 hours.  No results found for this or any previous visit (from the past 240 hour(s)).       Radiology Studies: No results found.      Scheduled Meds:  enoxaparin (LOVENOX) injection  40 mg Subcutaneous Daily   feeding supplement  237 mL Oral BID BM   fentaNYL  1 patch Transdermal Q72H   Continuous Infusions:     LOS: 6 days    Time spent: 35 mins    Wyvonnia Dusky, MD [facility] Pager [phone]  If 7PM-7AM, please contact night-coverage [email or web address] 07/03/2022, 2:31 PM

## 2022-06-30 NOTE — Progress Notes (Signed)
Occupational Therapy Treatment Patient Details Name: Andrew Walsh MRN: [id number] DOB: Apr 03, 1941 Today's Date: 06/30/2022   History of present illness Pt is an 81 year old male admitted with minimally displaced greater trochanteric fracture around what appears to be a well-positioned total hip arthroplasty which shows no evidence of loosening after fall at home; PMH significant for , hereditary hemochromatosis, requiring periodic phlebotomy sessions with IV fluid, lumbar stenosis status post lumbar decompression   OT comments  Chart reviewed, pt greeted in bed agreeable to OT tx session. Pt premedicated prior to session. Pt continues to report 10/10 pain with mobility in L hip. Pt performs bed mobility with MOD A, static sitting on edge of bed for no more than 3 minutes with pt reporting need for BM. Encouraged pt to transfers to bsc, pt with uncontrollable BM in bed. Pt returned to supine, Max A +2 required for rolling, peri care due to pain levels with full bed change provided. RN notified of pt status. Pt is left as received, all needs met. OT will follow acutely.    Recommendations for follow up therapy are one component of a multi-disciplinary discharge planning process, led by the attending physician.  Recommendations may be updated based on patient status, additional functional criteria and insurance authorization.    Follow Up Recommendations  Skilled nursing-short term rehab (<3 hours/day)    Assistance Recommended at Discharge Frequent or constant Supervision/Assistance  Patient can return home with the following  A lot of help with walking and/or transfers;A lot of help with bathing/dressing/bathroom   Equipment Recommendations  Other (comment) (per next venue of care)    Recommendations for Other Services      Precautions / Restrictions Precautions Precautions: Fall Restrictions Weight Bearing Restrictions: Yes LLE Weight Bearing: Partial weight bearing        Mobility Bed Mobility Overal bed mobility: Needs Assistance Bed Mobility: Rolling, Supine to Sit, Sit to Supine Rolling: Max assist, +2 for physical assistance (due to pain levels)   Supine to sit: Mod assist, HOB elevated Sit to supine: Mod assist        Transfers                   General transfer comment: attempted STS, pt with uncontrollable loose BM     Balance Overall balance assessment: Needs assistance Sitting-balance support: Feet supported Sitting balance-Leahy Scale: Good                                     ADL either performed or assessed with clinical judgement   ADL Overall ADL's : Needs assistance/impaired                     Lower Body Dressing: Maximal assistance       Toileting- Clothing Manipulation and Hygiene: Total assistance;Bed level;Cueing for safety;Cueing for sequencing              Extremity/Trunk Assessment              Vision       Perception     Praxis      Cognition Arousal/Alertness: Awake/alert Behavior During Therapy: WFL for tasks assessed/performed Overall Cognitive Status: Within Functional Limits for tasks assessed                               Problem  Solving: Requires verbal cues, Requires tactile cues          Exercises      Shoulder Instructions       General Comments      Pertinent Vitals/ Pain       Pain Assessment Pain Assessment: 0-10 Pain Score: 10-Worst pain ever Pain Location: L hip Pain Descriptors / Indicators: Aching, Discomfort, Grimacing Pain Intervention(s): Limited activity within patient's tolerance, Monitored during session, Premedicated before session, Repositioned, Other (comment) (RN notified)  Home Living                                          Prior Functioning/Environment              Frequency  Min 2X/week        Progress Toward Goals  OT Goals(current goals can now be found in the care  plan section)  Progress towards OT goals: Progressing toward goals     Plan Discharge plan remains appropriate    Co-evaluation                 AM-PAC OT "6 Clicks" Daily Activity     Outcome Measure   Help from another person eating meals?: None Help from another person taking care of personal grooming?: None Help from another person toileting, which includes using toliet, bedpan, or urinal?: A Lot Help from another person bathing (including washing, rinsing, drying)?: A Lot Help from another person to put on and taking off regular upper body clothing?: A Little Help from another person to put on and taking off regular lower body clothing?: A Lot 6 Click Score: 17    End of Session    OT Visit Diagnosis: Unsteadiness on feet (R26.81);History of falling (Z91.81)   Activity Tolerance Patient limited by pain   Patient Left in bed;with call bell/phone within reach;with bed alarm set   Nurse Communication Mobility status (pain level)        Time: 1561-5379 OT Time Calculation (min): 27 min  Charges: OT General Charges $OT Visit: 1 Visit OT Treatments $Self Care/Home Management : 23-37 mins  Shanon Payor, OTD OTR/L  06/30/22, 4:15 PM

## 2022-06-30 NOTE — Plan of Care (Signed)
Problem: Education: Goal: Knowledge of General Education information will improve Description: Including pain rating scale, medication(s)/side effects and non-pharmacologic comfort measures 06/30/2022 0826 by Gwendel Hanson, LPN Outcome: Adequate for Discharge 06/30/2022 0826 by Gwendel Hanson, LPN Outcome: Progressing   Problem: Health Behavior/Discharge Planning: Goal: Ability to manage health-related needs will improve 06/30/2022 0826 by Gwendel Hanson, LPN Outcome: Adequate for Discharge 06/30/2022 0826 by Gwendel Hanson, LPN Outcome: Progressing   Problem: Clinical Measurements: Goal: Ability to maintain clinical measurements within normal limits will improve 06/30/2022 0826 by Gwendel Hanson, LPN Outcome: Adequate for Discharge 06/30/2022 0826 by Gwendel Hanson, LPN Outcome: Progressing Goal: Will remain free from infection 06/30/2022 0826 by Gwendel Hanson, LPN Outcome: Adequate for Discharge 06/30/2022 0826 by Gwendel Hanson, LPN Outcome: Progressing Goal: Diagnostic test results will improve 06/30/2022 0826 by Gwendel Hanson, LPN Outcome: Adequate for Discharge 06/30/2022 0826 by Gwendel Hanson, LPN Outcome: Progressing Goal: Respiratory complications will improve 06/30/2022 0826 by Gwendel Hanson, LPN Outcome: Adequate for Discharge 06/30/2022 0826 by Gwendel Hanson, LPN Outcome: Progressing Goal: Cardiovascular complication will be avoided 06/30/2022 0826 by Gwendel Hanson, LPN Outcome: Adequate for Discharge 06/30/2022 0826 by Gwendel Hanson, LPN Outcome: Progressing   Problem: Activity: Goal: Risk for activity intolerance will decrease 06/30/2022 0826 by Gwendel Hanson, LPN Outcome: Adequate for Discharge 06/30/2022 0826 by Gwendel Hanson, LPN Outcome: Progressing   Problem: Nutrition: Goal: Adequate nutrition will be maintained 06/30/2022 0826 by Gwendel Hanson, LPN Outcome: Adequate for Discharge 06/30/2022 0826 by Gwendel Hanson, LPN Outcome:  Progressing   Problem: Coping: Goal: Level of anxiety will decrease 06/30/2022 0826 by Gwendel Hanson, LPN Outcome: Adequate for Discharge 06/30/2022 0826 by Gwendel Hanson, LPN Outcome: Progressing   Problem: Elimination: Goal: Will not experience complications related to bowel motility 06/30/2022 0826 by Gwendel Hanson, LPN Outcome: Adequate for Discharge 06/30/2022 0826 by Gwendel Hanson, LPN Outcome: Progressing Goal: Will not experience complications related to urinary retention 06/30/2022 0826 by Gwendel Hanson, LPN Outcome: Adequate for Discharge 06/30/2022 0826 by Gwendel Hanson, LPN Outcome: Progressing   Problem: Pain Managment: Goal: General experience of comfort will improve 06/30/2022 0826 by Gwendel Hanson, LPN Outcome: Adequate for Discharge 06/30/2022 0826 by Gwendel Hanson, LPN Outcome: Progressing   Problem: Safety: Goal: Ability to remain free from injury will improve 06/30/2022 0826 by Gwendel Hanson, LPN Outcome: Adequate for Discharge 06/30/2022 0826 by Gwendel Hanson, LPN Outcome: Progressing   Problem: Skin Integrity: Goal: Risk for impaired skin integrity will decrease 06/30/2022 0826 by Gwendel Hanson, LPN Outcome: Adequate for Discharge 06/30/2022 0826 by Gwendel Hanson, LPN Outcome: Progressing   Problem: Education: Goal: Verbalization of understanding the information provided (i.e., activity precautions, restrictions, etc) will improve 06/30/2022 0826 by Gwendel Hanson, LPN Outcome: Adequate for Discharge 06/30/2022 0826 by Gwendel Hanson, LPN Outcome: Progressing Goal: Individualized Educational Video(s) 06/30/2022 0826 by Gwendel Hanson, LPN Outcome: Adequate for Discharge 06/30/2022 0826 by Gwendel Hanson, LPN Outcome: Progressing   Problem: Activity: Goal: Ability to ambulate and perform ADLs will improve 06/30/2022 0826 by Gwendel Hanson, LPN Outcome: Adequate for Discharge 06/30/2022 0826 by Gwendel Hanson, LPN Outcome:  Progressing   Problem: Clinical Measurements: Goal: Postoperative complications will be avoided or minimized 06/30/2022 0826 by Gwendel Hanson, LPN Outcome: Adequate for Discharge 06/30/2022 0826 by Gwendel Hanson, LPN Outcome: Progressing   Problem: Self-Concept: Goal:  Ability to maintain and perform role responsibilities to the fullest extent possible will improve 06/30/2022 0826 by Gwendel Hanson, LPN Outcome: Adequate for Discharge 06/30/2022 0826 by Gwendel Hanson, LPN Outcome: Progressing   Problem: Pain Management: Goal: Pain level will decrease 06/30/2022 0826 by Gwendel Hanson, LPN Outcome: Adequate for Discharge 06/30/2022 0826 by Gwendel Hanson, LPN Outcome: Progressing

## 2022-06-30 NOTE — Care Management Important Message (Signed)
Important Message  Patient Details  Name: Andrew Walsh MRN: [id number] Date of Birth: 03-20-1941   Medicare Important Message Given:  Yes     Conception Oms, RN 06/30/2022, 12:10 PM

## 2022-06-30 NOTE — Progress Notes (Signed)
PROGRESS NOTE    Andrew Walsh  TIW:[id number] DOB: [birth year] DOA: 06/27/2022 PCP: Adin Hector, MD   Assessment & Plan:   Principal Problem:   Closed left femoral fracture ([place]) Active Problems:   History of prostate cancer   Hereditary hemochromatosis ([place])   HLD (hyperlipidemia)   Lumbar stenosis   Left hip pain  Assessment and Plan: Closed left femoral fracture: no surgical intervention as per ortho surg. Still w/ significant pain, so d/c norco and start percocet. Morphine prn   HLD: continue on statin    Hereditary hemochromatosis: continue periodic phlebotomy sessions with IV fluid.  Macrocytic anemia: folate, B12 are both WNL. No need for a transfusion currently     DVT prophylaxis: lovenox  Code Status: full  Family Communication:  Disposition Plan: likely d/c to SNF   Level of care: Med-Surg  Status is: Inpatient Remains inpatient appropriate because:  still has significant pain, so made adjustments to pain meds. Will likely d/c to SNF on 07/03/22    Consultants:  Ortho surg   Procedures:   Antimicrobials:    Subjective: Pt c/o significant left leg pain   Objective: Vitals:   06/29/22 0805 06/29/22 1547 06/29/22 2212 06/30/22 0757  BP: (!) 158/78  (!) 163/75 (!) 162/84  Pulse: (!) 58 68 64 64  Resp: '16  20 16  '$ Temp: 98 F (36.7 C)  98 F (36.7 C) 98.4 F (36.9 C)  TempSrc:      SpO2: 99% 98% 97% 98%  Weight:      Height:        Intake/Output Summary (Last 24 hours) at 06/30/2022 1310 Last data filed at 06/30/2022 1013 Gross per 24 hour  Intake --  Output 1577 ml  Net -1577 ml   Filed Weights   06/27/22 1410  Weight: 73.4 kg    Examination:  General exam: Appears agitated & frustrated   Respiratory system: clear breath sounds b/l  Cardiovascular system: S1/S2+. No rubs or gallops  Gastrointestinal system: Abd is soft, NT, ND & hypoactive bowel sounds  Central nervous system: Alert and awake. Moves all extremities    Psychiatry:  judgement and insight appears abnormal. Agitated & frustrated mood and affect    Data Reviewed: I have personally reviewed following labs and imaging studies  CBC: Recent Labs  Lab 06/27/22 1530 06/28/22 0511 06/29/22 0446 06/30/22 0648  WBC 6.6 5.7 6.0 6.2  NEUTROABS 5.0  --   --   --   HGB 12.3* 11.6* 11.0* 10.8*  HCT 36.0* 33.6* 32.8* 31.7*  MCV 102.3* 102.4* 102.8* 102.3*  PLT 216 210 201 250   Basic Metabolic Panel: Recent Labs  Lab 06/27/22 1530 06/28/22 0511 06/29/22 0446 06/30/22 0648  NA 133* 135 136 135  K 4.4 3.9 3.9 4.0  CL 105 106 109 105  CO2 '23 22 23 24  '$ GLUCOSE 111* 106* 99 99  BUN '18 14 13 11  '$ CREATININE 0.76 0.61 0.55* 0.57*  CALCIUM 8.8* 8.9 8.4* 8.7*   GFR: Estimated Creatinine Clearance: 66.6 mL/min (A) (by C-G formula based on SCr of 0.57 mg/dL (L)). Liver Function Tests: No results for input(s): "AST", "ALT", "ALKPHOS", "BILITOT", "PROT", "ALBUMIN" in the last 168 hours. No results for input(s): "LIPASE", "AMYLASE" in the last 168 hours. No results for input(s): "AMMONIA" in the last 168 hours. Coagulation Profile: No results for input(s): "INR", "PROTIME" in the last 168 hours. Cardiac Enzymes: No results for input(s): "CKTOTAL", "CKMB", "CKMBINDEX", "TROPONINI" in the last  168 hours. BNP (last 3 results) No results for input(s): "PROBNP" in the last 8760 hours. HbA1C: No results for input(s): "HGBA1C" in the last 72 hours. CBG: No results for input(s): "GLUCAP" in the last 168 hours. Lipid Profile: No results for input(s): "CHOL", "HDL", "LDLCALC", "TRIG", "CHOLHDL", "LDLDIRECT" in the last 72 hours. Thyroid Function Tests: No results for input(s): "TSH", "T4TOTAL", "FREET4", "T3FREE", "THYROIDAB" in the last 72 hours. Anemia Panel: Recent Labs    06/29/22 0446  VITAMINB12 467  FOLATE 6.4   Sepsis Labs: No results for input(s): "PROCALCITON", "LATICACIDVEN" in the last 168 hours.  No results found for this or  any previous visit (from the past 240 hour(s)).       Radiology Studies: No results found.      Scheduled Meds:  enoxaparin (LOVENOX) injection  40 mg Subcutaneous Daily   feeding supplement  237 mL Oral BID BM   Continuous Infusions:     LOS: 3 days    Time spent: 37 mins    Wyvonnia Dusky, MD [facility] Pager [phone]  If 7PM-7AM, please contact night-coverage [email or web address] 06/30/2022, 1:10 PM

## 2022-06-30 NOTE — Progress Notes (Signed)
PT Cancellation Note  Patient Details Name: Andrew Walsh MRN: [id number] DOB: 1941-05-06   Cancelled Treatment:    Reason Eval/Treat Not Completed: Pain limiting ability to participate. Pt in room with OT, bowel incontinence in bed, pain levels severely elevated despite premedication. Necessity of the moment leans toward bowel maintenance and pericare, potentially linen change. Pt is screaming in pain when moving. He takes time to voice genuine concerns of likely intolerance of an adjacent PT session once this turmoil comes to resolution. Will resume our services at later date/time. Of note, Pt is now official on his first cycle of pain medication update made today, he reports no significant improvements with high confidence.   3:35 PM, 06/30/22 [name], PT, DPT Physical Therapist - [facility]  [phone] ([place])     [place] 06/30/2022, 3:33 PM

## 2022-06-30 NOTE — Plan of Care (Signed)

## 2022-06-30 NOTE — Plan of Care (Signed)

## 2022-07-01 DIAGNOSIS — S7292XA Unspecified fracture of left femur, initial encounter for closed fracture: Secondary | ICD-10-CM | POA: Diagnosis not present

## 2022-07-01 DIAGNOSIS — D539 Nutritional anemia, unspecified: Secondary | ICD-10-CM | POA: Diagnosis not present

## 2022-07-01 LAB — CBC
HCT: 30.1 % — ABNORMAL LOW (ref 39.0–52.0)
Hemoglobin: 10.6 g/dL — ABNORMAL LOW (ref 13.0–17.0)
MCH: 35.2 pg — ABNORMAL HIGH (ref 26.0–34.0)
MCHC: 35.2 g/dL (ref 30.0–36.0)
MCV: 100 fL (ref 80.0–100.0)
Platelets: 210 10*3/uL (ref 150–400)
RBC: 3.01 MIL/uL — ABNORMAL LOW (ref 4.22–5.81)
RDW: 12.2 % (ref 11.5–15.5)
WBC: 6 10*3/uL (ref 4.0–10.5)
nRBC: 0 % (ref 0.0–0.2)

## 2022-07-01 LAB — BASIC METABOLIC PANEL WITH GFR
Anion gap: 9 (ref 5–15)
BUN: 14 mg/dL (ref 8–23)
CO2: 23 mmol/L (ref 22–32)
Calcium: 8.6 mg/dL — ABNORMAL LOW (ref 8.9–10.3)
Chloride: 100 mmol/L (ref 98–111)
Creatinine, Ser: 0.67 mg/dL (ref 0.61–1.24)
GFR, Estimated: >60 mL/min
Glucose, Bld: 98 mg/dL (ref 70–99)
Potassium: 3.9 mmol/L (ref 3.5–5.1)
Sodium: 132 mmol/L — ABNORMAL LOW (ref 135–145)

## 2022-07-01 MED ORDER — FENTANYL 12 MCG/HR TD PT72
1.0000 | MEDICATED_PATCH | TRANSDERMAL | Status: DC
  Administered 2022-07-01: 1 via TRANSDERMAL
  Filled 2022-07-01: qty 1

## 2022-07-01 MED ORDER — OXYCODONE-ACETAMINOPHEN 7.5-325 MG PO TABS: 2.0000 | ORAL_TABLET | Freq: Four times a day (QID) | ORAL | Status: DC | PRN

## 2022-07-01 MED ORDER — OXYCODONE-ACETAMINOPHEN 7.5-325 MG PO TABS
1.0000 | ORAL_TABLET | Freq: Four times a day (QID) | ORAL | Status: DC | PRN
  Administered 2022-07-01 – 2022-07-04 (×8): 1 via ORAL
  Filled 2022-07-01 (×8): qty 1

## 2022-07-01 NOTE — Progress Notes (Signed)
PROGRESS NOTE    Andrew Walsh  FOY:[id number] DOB: 22-Jul-1941 DOA: 06/27/2022 PCP: Adin Hector, MD   Assessment & Plan:   Principal Problem:   Closed left femoral fracture ([place]) Active Problems:   History of prostate cancer   Hereditary hemochromatosis ([place])   HLD (hyperlipidemia)   Lumbar stenosis   Left hip pain  Assessment and Plan: Closed left femoral fracture: no surgical intervention as per ortho surg. Still w/ significant pain, continue on percocet, morphine and start fentanyl patch   HLD: continue on statin    Hereditary hemochromatosis: continue w/ intermittent phlebotomy sessions  Macrocytic anemia: B12, folate are both WNL. Will transfuse if Hb < 7.0    DVT prophylaxis: lovenox  Code Status: full  Family Communication: discussed pt's care w/ pt's wife, Cyndee Brightly, and answered her questions  Disposition Plan: likely d/c to SNF   Level of care: Med-Surg  Status is: Inpatient Remains inpatient appropriate because:  still has significant pain, so made adjustments to pain meds. Will likely d/c to SNF on 07/03/22    Consultants:  Ortho surg   Procedures:   Antimicrobials:    Subjective: Pt still c/o significant left leg pain  Objective: Vitals:   06/29/22 2212 06/30/22 0757 06/30/22 1852 07/01/22 0033  BP: (!) 163/75 (!) 162/84 114/61 (!) 148/75  Pulse: 64 64 64 63  Resp: '20 16 16 18  '$ Temp: 98 F (36.7 C) 98.4 F (36.9 C) 99 F (37.2 C) 100 F (37.8 C)  TempSrc:      SpO2: 97% 98% 95% 97%  Weight:      Height:        Intake/Output Summary (Last 24 hours) at 07/01/2022 0808 Last data filed at 07/01/2022 0416 Gross per 24 hour  Intake --  Output 700 ml  Net -700 ml   Filed Weights   06/27/22 1410  Weight: 73.4 kg    Examination:  General exam: Appears calm  Respiratory system: clear breath sounds b/l  Cardiovascular system: S1 & S2+. No rubs or clicks  Gastrointestinal system: Abd is soft, NT, ND & hypoactive bowel  sounds Central nervous system: alert and awake. Moves all extremities  Psychiatry:  judgement and insight appears abnormal. Flat mood and affect     Data Reviewed: I have personally reviewed following labs and imaging studies  CBC: Recent Labs  Lab 06/27/22 1530 06/28/22 0511 06/29/22 0446 06/30/22 0648 07/01/22 0510  WBC 6.6 5.7 6.0 6.2 6.0  NEUTROABS 5.0  --   --   --   --   HGB 12.3* 11.6* 11.0* 10.8* 10.6*  HCT 36.0* 33.6* 32.8* 31.7* 30.1*  MCV 102.3* 102.4* 102.8* 102.3* 100.0  PLT 216 210 201 193 767   Basic Metabolic Panel: Recent Labs  Lab 06/27/22 1530 06/28/22 0511 06/29/22 0446 06/30/22 0648 07/01/22 0510  NA 133* 135 136 135 132*  K 4.4 3.9 3.9 4.0 3.9  CL 105 106 109 105 100  CO2 '23 22 23 24 23  '$ GLUCOSE 111* 106* 99 99 98  BUN '18 14 13 11 14  '$ CREATININE 0.76 0.61 0.55* 0.57* 0.67  CALCIUM 8.8* 8.9 8.4* 8.7* 8.6*   GFR: Estimated Creatinine Clearance: 66.6 mL/min (by C-G formula based on SCr of 0.67 mg/dL). Liver Function Tests: No results for input(s): "AST", "ALT", "ALKPHOS", "BILITOT", "PROT", "ALBUMIN" in the last 168 hours. No results for input(s): "LIPASE", "AMYLASE" in the last 168 hours. No results for input(s): "AMMONIA" in the last 168 hours. Coagulation Profile:  No results for input(s): "INR", "PROTIME" in the last 168 hours. Cardiac Enzymes: No results for input(s): "CKTOTAL", "CKMB", "CKMBINDEX", "TROPONINI" in the last 168 hours. BNP (last 3 results) No results for input(s): "PROBNP" in the last 8760 hours. HbA1C: No results for input(s): "HGBA1C" in the last 72 hours. CBG: No results for input(s): "GLUCAP" in the last 168 hours. Lipid Profile: No results for input(s): "CHOL", "HDL", "LDLCALC", "TRIG", "CHOLHDL", "LDLDIRECT" in the last 72 hours. Thyroid Function Tests: No results for input(s): "TSH", "T4TOTAL", "FREET4", "T3FREE", "THYROIDAB" in the last 72 hours. Anemia Panel: Recent Labs    06/29/22 0446  VITAMINB12 467   FOLATE 6.4   Sepsis Labs: No results for input(s): "PROCALCITON", "LATICACIDVEN" in the last 168 hours.  No results found for this or any previous visit (from the past 240 hour(s)).       Radiology Studies: CT HIP LEFT WO CONTRAST  Result Date: 06/30/2022 CLINICAL DATA:  Fall.  Left hip fracture EXAM: CT OF THE LEFT HIP WITHOUT CONTRAST TECHNIQUE: Multidetector CT imaging of the left hip was performed according to the standard protocol. Multiplanar CT image reconstructions were also generated. RADIATION DOSE REDUCTION: This exam was performed according to the departmental dose-optimization program which includes automated exposure control, adjustment of the mA and/or kV according to patient size and/or use of iterative reconstruction technique. COMPARISON:  X-ray 06/27/2022 FINDINGS: Bones/Joint/Cartilage Surgical changes from left total hip arthroplasty. Arthroplasty components are in their expected alignment. There is the acute minimally displaced periprosthetic fracture of the intertrochanteric aspect of the left femur (series 6, images 40-48). No periprosthetic fracture or lucency involving the acetabular component. No lytic or sclerotic bone lesion is evident. Ligaments Suboptimally assessed by CT. Muscles and Tendons No acute musculotendinous abnormality by CT. Soft tissues Soft tissue edema overlying the lateral aspect of the hip. No organized hematoma. Atherosclerotic vascular calcifications. Extensive colonic diverticulosis. IMPRESSION: Acute minimally displaced periprosthetic fracture of the intertrochanteric aspect of the left femur. Electronically Signed   By: Davina Poke D.O.   On: 06/30/2022 18:55        Scheduled Meds:  enoxaparin (LOVENOX) injection  40 mg Subcutaneous Daily   feeding supplement  237 mL Oral BID BM   Continuous Infusions:     LOS: 4 days    Time spent: 30 mins    Wyvonnia Dusky, MD [facility] Pager [phone]  If 7PM-7AM,  please contact night-coverage [email or web address] 07/01/2022, 8:08 AM

## 2022-07-01 NOTE — Progress Notes (Signed)
Patient ID: Andrew Walsh, male   DOB: [birth year], 81 y.o.   MRN: [id number]  Subjective: The patient denies any change in his symptoms over the past 2 days.  He continues to complain of severe pain in his left hip with any active motion or attempt at weightbearing.  He is still unable to take any steps with physical therapy, even using a walker.  He denies any reinjury to the hip, and denies any numbness or paresthesias down his leg to his foot.   Objective: Vital signs in last 24 hours: Temp:  [99 F (37.2 C)-100 F (37.8 C)] 100 F (37.8 C) (10/21 0033) Pulse Rate:  [63-64] 63 (10/21 0033) Resp:  [16-18] 18 (10/21 0033) BP: (114-148)/(61-75) 148/75 (10/21 0033) SpO2:  [95 %-97 %] 97 % (10/21 0033)  Intake/Output from previous day: 10/20 0701 - 10/21 0700 In: -  Out: 925 [Urine:925] Intake/Output this shift: Total I/O In: -  Out: 450 [Urine:450]  Recent Labs    06/29/22 0446 06/30/22 0648 07/01/22 0510  HGB 11.0* 10.8* 10.6*   Recent Labs    06/30/22 0648 07/01/22 0510  WBC 6.2 6.0  RBC 3.10* 3.01*  HCT 31.7* 30.1*  PLT 193 210   Recent Labs    06/30/22 0648 07/01/22 0510  NA 135 132*  K 4.0 3.9  CL 105 100  CO2 24 23  BUN 11 14  CREATININE 0.57* 0.67  GLUCOSE 99 98  CALCIUM 8.7* 8.6*   No results for input(s): "LABPT", "INR" in the last 72 hours.  Physical Exam: Orthopedic examination again is limited to the left hip and lower extremity.  Skin inspection around the left hip is notable for mild swelling, but otherwise is unremarkable.  No erythema, abrasions, or other skin abnormalities are identified.  He again experiences moderate tenderness to palpation over the lateral aspect of the hip, and more severe pain with any attempted active or passive motion of the hip.  He is grossly neurovascularly intact to the left lower extremity and foot.  X-rays: A CT scan of the pelvis and left hip from yesterday is available for review and has been reviewed by  myself.  This study confirms the presence of a nondisplaced fracture through the greater trochanter which has not changed in alignment compared to the initial presenting plain radiographs.  There is no evidence for any other acute bony abnormalities nor is there any evidence of any soft tissue issues such as torn tendons etc.  The prosthesis itself appears to be in good position and without evidence of loosening.  Assessment: Essentially nondisplaced periprosthetic left greater trochanteric fracture.  Plan: The treatment options again have been reviewed with the patient and his wife.  The patient is reassured that the CT scan shows that there are no other injuries or issues going on around the left hip at this time, other than the greater trochanteric fracture.  Therefore, the treatment plan will not change.  He will continue to work with physical therapy, gradually progressing in his mobilization as symptoms permit.  He will continue to receive pain medication as deemed appropriate from a medical standpoint.  Most likely he will require rehab placement barring a significant change in his mobility status over the next 48 hours.   Marshall Cork Chimere Klingensmith 07/01/2022, 11:09 AM

## 2022-07-01 NOTE — Plan of Care (Signed)

## 2022-07-01 NOTE — Progress Notes (Signed)
Physical Therapy Treatment Patient Details Name: Andrew Walsh MRN: [id number] DOB: 1941-04-28 Today's Date: 07/01/2022   History of Present Illness Pt is an 81 year old male admitted with minimally displaced greater trochanteric fracture around what appears to be a well-positioned total hip arthroplasty which shows no evidence of loosening after fall at home; PMH significant for , hereditary hemochromatosis, requiring periodic phlebotomy sessions with IV fluid, lumbar stenosis status post lumbar decompression    PT Comments    Pt was long sitting in bed upon arriving. Pre-medicated prior to session and agreeable to get OOB. Pt overall endorses less pain than previous few days but still remains somewhat pain limited. He was able to perform there ex in bed and at EOB. Performed standing EOB 5 x while advancing to taking steps to/from recliner. Pt does require Vcs for safety and PWB but overall does well adhering. He is progressing towards PT goals but remains SNF appropriate to maximize safety with all ADLs. PT will continue to follow and advance per current POC.     Recommendations for follow up therapy are one component of a multi-disciplinary discharge planning process, led by the attending physician.  Recommendations may be updated based on patient status, additional functional criteria and insurance authorization.  Follow Up Recommendations  Skilled nursing-short term rehab (<3 hours/day)     Assistance Recommended at Discharge Frequent or constant Supervision/Assistance  Patient can return home with the following A little help with walking and/or transfers;A lot of help with bathing/dressing/bathroom;Assistance with cooking/housework;Direct supervision/assist for medications management;Direct supervision/assist for financial management;Assist for transportation;Help with stairs or ramp for entrance   Equipment Recommendations   (defer to next level of care)    Recommendations for  Other Services       Precautions / Restrictions Precautions Precautions: Fall Restrictions Weight Bearing Restrictions: Yes LLE Weight Bearing: Partial weight bearing LLE Partial Weight Bearing Percentage or Pounds: 50%?     Mobility  Bed Mobility Overal bed mobility: Needs Assistance Bed Mobility: Supine to Sit, Sit to Supine     Supine to sit: Mod assist, HOB elevated Sit to supine: Mod assist, HOB elevated   General bed mobility comments: pt was able to progress OOB and return to bed after OOB activity with mod assist + increased time    Transfers Overall transfer level: Needs assistance Equipment used: Rolling walker (2 wheels) Transfers: Sit to/from Stand Sit to Stand: Min assist, From elevated surface           General transfer comment: Pt stood EOB~ 5 x total. stood ~ 1-2 minutes each time. Performed marching with Vcs for PWB. was able to perform with vcs.    Ambulation/Gait Ambulation/Gait assistance: Min assist Gait Distance (Feet): 3 Feet Assistive device: Rolling walker (2 wheels) Gait Pattern/deviations: Step-to pattern, Antalgic Gait velocity: decreased     General Gait Details: Pt was able to take steps with increased time and vcs for wt shift and adhereing to PWB. tolerated well overall. Chief Strategy Officer assisted pt to/from recliner    Balance Overall balance assessment: Needs assistance Sitting-balance support: Feet supported Sitting balance-Leahy Scale: Good      Cognition Arousal/Alertness: Awake/alert Behavior During Therapy: WFL for tasks assessed/performed Overall Cognitive Status: Within Functional Limits for tasks assessed      Problem Solving: Slow processing, Decreased initiation General Comments: Pt is alert and remember author from previous admissions. Endorses still having pain but is overall in less pain than previous observed thoughout this admission.  General Comments General comments (skin integrity, edema, etc.):  Reviewed and answered pt's questions about HEP an had pt perform several during session. He tolerated EOB exercises well with increased time and vcs for correct performance.      Pertinent Vitals/Pain Pain Assessment Pain Assessment: 0-10 Pain Score: 8  Faces Pain Scale: Hurts whole lot Pain Location: L hip Pain Descriptors / Indicators: Aching, Discomfort, Grimacing Pain Intervention(s): Limited activity within patient's tolerance, Monitored during session, Premedicated before session, Repositioned     PT Goals (current goals can now be found in the care plan section) Acute Rehab PT Goals Patient Stated Goal: Be able to walk again Progress towards PT goals: Progressing toward goals    Frequency    7X/week      PT Plan Current plan remains appropriate       AM-PAC PT "6 Clicks" Mobility   Outcome Measure  Help needed turning from your back to your side while in a flat bed without using bedrails?: A Lot Help needed moving from lying on your back to sitting on the side of a flat bed without using bedrails?: A Lot Help needed moving to and from a bed to a chair (including a wheelchair)?: A Lot Help needed standing up from a chair using your arms (e.g., wheelchair or bedside chair)?: A Lot Help needed to walk in hospital room?: A Lot Help needed climbing 3-5 steps with a railing? : Total 6 Click Score: 11    End of Session   Activity Tolerance: Patient tolerated treatment well Patient left: in bed;with call bell/phone within reach;with bed alarm set Nurse Communication: Mobility status PT Visit Diagnosis: Difficulty in walking, not elsewhere classified (R26.2);Other abnormalities of gait and mobility (R26.89)     Time: 7510-2585 PT Time Calculation (min) (ACUTE ONLY): 23 min  Charges:  $Therapeutic Exercise: 8-22 mins $Therapeutic Activity: 8-22 mins                     Julaine Fusi PTA 07/01/22, 5:45 PM

## 2022-07-02 DIAGNOSIS — S7292XA Unspecified fracture of left femur, initial encounter for closed fracture: Secondary | ICD-10-CM | POA: Diagnosis not present

## 2022-07-02 DIAGNOSIS — D539 Nutritional anemia, unspecified: Secondary | ICD-10-CM | POA: Diagnosis not present

## 2022-07-02 LAB — BASIC METABOLIC PANEL
Anion gap: 8 (ref 5–15)
BUN: 16 mg/dL (ref 8–23)
CO2: 24 mmol/L (ref 22–32)
Calcium: 8.6 mg/dL — ABNORMAL LOW (ref 8.9–10.3)
Chloride: 101 mmol/L (ref 98–111)
Creatinine, Ser: 0.61 mg/dL (ref 0.61–1.24)
GFR, Estimated: >60 mL/min (ref >60)
Glucose, Bld: 95 mg/dL (ref 70–99)
Potassium: 4 mmol/L (ref 3.5–5.1)
Sodium: 133 mmol/L — ABNORMAL LOW (ref 135–145)

## 2022-07-02 LAB — CBC
HCT: 32.4 % — ABNORMAL LOW (ref 39.0–52.0)
Hemoglobin: 11.1 g/dL — ABNORMAL LOW (ref 13.0–17.0)
MCH: 34.5 pg — ABNORMAL HIGH (ref 26.0–34.0)
MCHC: 34.3 g/dL (ref 30.0–36.0)
MCV: 100.6 fL — ABNORMAL HIGH (ref 80.0–100.0)
Platelets: 215 10*3/uL (ref 150–400)
RBC: 3.22 MIL/uL — ABNORMAL LOW (ref 4.22–5.81)
RDW: 12.5 % (ref 11.5–15.5)
WBC: 4.7 10*3/uL (ref 4.0–10.5)
nRBC: 0 % (ref 0.0–0.2)

## 2022-07-02 NOTE — Progress Notes (Signed)
PROGRESS NOTE    Andrew Walsh  ALP:[id number] DOB: 1940/10/20 DOA: 06/27/2022 PCP: Adin Hector, MD   Assessment & Plan:   Principal Problem:   Closed left femoral fracture ([place]) Active Problems:   History of prostate cancer   Hereditary hemochromatosis ([place])   HLD (hyperlipidemia)   Lumbar stenosis   Left hip pain  Assessment and Plan: Closed left femoral fracture: no surgical intervention as per ortho surg. Pain improved w/ fentanyl patch & prn percocet   HLD: continue on statin    Hereditary hemochromatosis: continue w/ intermittent phlebotomy sessions  Macrocytic anemia: folate, B12 are both WNL. No need for a transfusion currently     DVT prophylaxis: lovenox  Code Status: full  Family Communication:  Disposition Plan: likely d/c to SNF   Level of care: Med-Surg  Status is: Inpatient Remains inpatient appropriate because:  improved pain, possible d/c tomorrow to SNF     Consultants:  Ortho surg   Procedures:   Antimicrobials:    Subjective: Pt c/o left leg pain improved from day prior   Objective: Vitals:   06/30/22 1852 07/01/22 0033 07/01/22 1616 07/01/22 2337  BP: 114/61 (!) 148/75 97/61 (!) 154/80  Pulse: 64 63 (!) 52 62  Resp: '16 18 20 16  '$ Temp: 99 F (37.2 C) 100 F (37.8 C) 97.8 F (36.6 C) 97.9 F (36.6 C)  TempSrc:      SpO2: 95% 97%  100%  Weight:      Height:        Intake/Output Summary (Last 24 hours) at 07/02/2022 0757 Last data filed at 07/02/2022 0435 Gross per 24 hour  Intake --  Output 1025 ml  Net -1025 ml   Filed Weights   06/27/22 1410  Weight: 73.4 kg    Examination:  General exam: Appears calm & comfortable  Respiratory system: clear breath sounds b/l  Cardiovascular system: S1/S2+. No rubs or clicks  Gastrointestinal system: Abd is soft, NT, ND & hypoactive bowel sounds Central nervous system: alert and oriented. Moves all extremities  Psychiatry: judgement and insight appears improved.  Appropriate mood and affect    Data Reviewed: I have personally reviewed following labs and imaging studies  CBC: Recent Labs  Lab 06/27/22 1530 06/28/22 0511 06/29/22 0446 06/30/22 0648 07/01/22 0510 07/02/22 0451  WBC 6.6 5.7 6.0 6.2 6.0 4.7  NEUTROABS 5.0  --   --   --   --   --   HGB 12.3* 11.6* 11.0* 10.8* 10.6* 11.1*  HCT 36.0* 33.6* 32.8* 31.7* 30.1* 32.4*  MCV 102.3* 102.4* 102.8* 102.3* 100.0 100.6*  PLT 216 210 201 193 210 353   Basic Metabolic Panel: Recent Labs  Lab 06/28/22 0511 06/29/22 0446 06/30/22 0648 07/01/22 0510 07/02/22 0451  NA 135 136 135 132* 133*  K 3.9 3.9 4.0 3.9 4.0  CL 106 109 105 100 101  CO2 '22 23 24 23 24  '$ GLUCOSE 106* 99 99 98 95  BUN '14 13 11 14 16  '$ CREATININE 0.61 0.55* 0.57* 0.67 0.61  CALCIUM 8.9 8.4* 8.7* 8.6* 8.6*   GFR: Estimated Creatinine Clearance: 66.6 mL/min (by C-G formula based on SCr of 0.61 mg/dL). Liver Function Tests: No results for input(s): "AST", "ALT", "ALKPHOS", "BILITOT", "PROT", "ALBUMIN" in the last 168 hours. No results for input(s): "LIPASE", "AMYLASE" in the last 168 hours. No results for input(s): "AMMONIA" in the last 168 hours. Coagulation Profile: No results for input(s): "INR", "PROTIME" in the last 168 hours. Cardiac  Enzymes: No results for input(s): "CKTOTAL", "CKMB", "CKMBINDEX", "TROPONINI" in the last 168 hours. BNP (last 3 results) No results for input(s): "PROBNP" in the last 8760 hours. HbA1C: No results for input(s): "HGBA1C" in the last 72 hours. CBG: No results for input(s): "GLUCAP" in the last 168 hours. Lipid Profile: No results for input(s): "CHOL", "HDL", "LDLCALC", "TRIG", "CHOLHDL", "LDLDIRECT" in the last 72 hours. Thyroid Function Tests: No results for input(s): "TSH", "T4TOTAL", "FREET4", "T3FREE", "THYROIDAB" in the last 72 hours. Anemia Panel: No results for input(s): "VITAMINB12", "FOLATE", "FERRITIN", "TIBC", "IRON", "RETICCTPCT" in the last 72 hours.  Sepsis  Labs: No results for input(s): "PROCALCITON", "LATICACIDVEN" in the last 168 hours.  No results found for this or any previous visit (from the past 240 hour(s)).       Radiology Studies: CT HIP LEFT WO CONTRAST  Result Date: 06/30/2022 CLINICAL DATA:  Fall.  Left hip fracture EXAM: CT OF THE LEFT HIP WITHOUT CONTRAST TECHNIQUE: Multidetector CT imaging of the left hip was performed according to the standard protocol. Multiplanar CT image reconstructions were also generated. RADIATION DOSE REDUCTION: This exam was performed according to the departmental dose-optimization program which includes automated exposure control, adjustment of the mA and/or kV according to patient size and/or use of iterative reconstruction technique. COMPARISON:  X-ray 06/27/2022 FINDINGS: Bones/Joint/Cartilage Surgical changes from left total hip arthroplasty. Arthroplasty components are in their expected alignment. There is the acute minimally displaced periprosthetic fracture of the intertrochanteric aspect of the left femur (series 6, images 40-48). No periprosthetic fracture or lucency involving the acetabular component. No lytic or sclerotic bone lesion is evident. Ligaments Suboptimally assessed by CT. Muscles and Tendons No acute musculotendinous abnormality by CT. Soft tissues Soft tissue edema overlying the lateral aspect of the hip. No organized hematoma. Atherosclerotic vascular calcifications. Extensive colonic diverticulosis. IMPRESSION: Acute minimally displaced periprosthetic fracture of the intertrochanteric aspect of the left femur. Electronically Signed   By: Davina Poke D.O.   On: 06/30/2022 18:55        Scheduled Meds:  enoxaparin (LOVENOX) injection  40 mg Subcutaneous Daily   feeding supplement  237 mL Oral BID BM   fentaNYL  1 patch Transdermal Q72H   Continuous Infusions:     LOS: 5 days    Time spent: 33 mins    Wyvonnia Dusky, MD [facility] Pager [phone]  [phone]  If 7PM-7AM, please contact night-coverage [email or web address] 07/02/2022, 7:57 AM

## 2022-07-02 NOTE — Plan of Care (Signed)
  Problem: Education: Goal: Knowledge of General Education information will improve Description: Including pain rating scale, medication(s)/side effects and non-pharmacologic comfort measures Outcome: Progressing   Problem: Pain Managment: Goal: General experience of comfort will improve Outcome: Progressing   Problem: Safety: Goal: Ability to remain free from injury will improve Outcome: Progressing   Problem: Activity: Goal: Ability to ambulate and perform ADLs will improve Outcome: Progressing   Problem: Clinical Measurements: Goal: Postoperative complications will be avoided or minimized Outcome: Progressing   Problem: Pain Management: Goal: Pain level will decrease Outcome: Progressing

## 2022-07-02 NOTE — Progress Notes (Signed)
Physical Therapy Treatment Patient Details Name: Andrew Walsh MRN: [id number] DOB: December 25, 1940 Today's Date: 07/02/2022   History of Present Illness Pt is an 81 year old male admitted with minimally displaced greater trochanteric fracture around what appears to be a well-positioned total hip arthroplasty which shows no evidence of loosening after fall at home; PMH significant for , hereditary hemochromatosis, requiring periodic phlebotomy sessions with IV fluid, lumbar stenosis status post lumbar decompression    PT Comments    Participated in exercises as described below.  To EOB with heavy use of rails and min a x 1. Steady in sitting.  Stands with mod a x 1 and is able to move LLE through hip/knee flexion and Ab/add x 10.  2 standing trials.  He is able to step/pivot to recliner at bedside with mod a x 1 to maintain PWB with walker.   Recommendations for follow up therapy are one component of a multi-disciplinary discharge planning process, led by the attending physician.  Recommendations may be updated based on patient status, additional functional criteria and insurance authorization.  Follow Up Recommendations  Skilled nursing-short term rehab (<3 hours/day)     Assistance Recommended at Discharge Frequent or constant Supervision/Assistance  Patient can return home with the following A little help with walking and/or transfers;A lot of help with bathing/dressing/bathroom;Assistance with cooking/housework;Direct supervision/assist for medications management;Direct supervision/assist for financial management;Assist for transportation;Help with stairs or ramp for entrance   Equipment Recommendations       Recommendations for Other Services       Precautions / Restrictions Precautions Precautions: Fall Restrictions Weight Bearing Restrictions: Yes LLE Weight Bearing: Partial weight bearing LLE Partial Weight Bearing Percentage or Pounds: 50%?     Mobility  Bed  Mobility Overal bed mobility: Needs Assistance Bed Mobility: Supine to Sit Rolling: Mod assist              Transfers Overall transfer level: Needs assistance Equipment used: Rolling walker (2 wheels) Transfers: Sit to/from Stand Sit to Stand: Min assist, From elevated surface   Step pivot transfers: Mod assist       General transfer comment: stood EOB x 2 and is able to tranfer with walker to chair at bedside    Ambulation/Gait Ambulation/Gait assistance: Min assist Gait Distance (Feet): 3 Feet Assistive device: Rolling walker (2 wheels) Gait Pattern/deviations: Step-to pattern Gait velocity: decreased     General Gait Details: Pt was able to take steps with increased time and vcs for wt shift and adhereing to PWB. tolerated well overall.   Stairs             Wheelchair Mobility    Modified Rankin (Stroke Patients Only)       Balance Overall balance assessment: Needs assistance Sitting-balance support: Feet supported Sitting balance-Leahy Scale: Good     Standing balance support: Bilateral upper extremity supported, Reliant on assistive device for balance, During functional activity                                Cognition Arousal/Alertness: Awake/alert Behavior During Therapy: WFL for tasks assessed/performed Overall Cognitive Status: Within Functional Limits for tasks assessed                                          Exercises Other Exercises Other Exercises: supine AAROM LLE prior  to mobility    General Comments        Pertinent Vitals/Pain Pain Assessment Pain Assessment: Faces Faces Pain Scale: Hurts little more Pain Location: L hip - with mobility Pain Descriptors / Indicators: Aching, Discomfort, Grimacing Pain Intervention(s): Limited activity within patient's tolerance, Monitored during session, Repositioned, Premedicated before session    Home Living                          Prior  Function            PT Goals (current goals can now be found in the care plan section) Progress towards PT goals: Progressing toward goals    Frequency    7X/week      PT Plan Current plan remains appropriate    Co-evaluation              AM-PAC PT "6 Clicks" Mobility   Outcome Measure  Help needed turning from your back to your side while in a flat bed without using bedrails?: A Little Help needed moving from lying on your back to sitting on the side of a flat bed without using bedrails?: A Little Help needed moving to and from a bed to a chair (including a wheelchair)?: A Little Help needed standing up from a chair using your arms (e.g., wheelchair or bedside chair)?: A Little Help needed to walk in hospital room?: A Lot Help needed climbing 3-5 steps with a railing? : Total 6 Click Score: 15    End of Session Equipment Utilized During Treatment: Gait belt Activity Tolerance: Patient tolerated treatment well Patient left: in chair;with call bell/phone within reach;with chair alarm set Nurse Communication: Mobility status PT Visit Diagnosis: Difficulty in walking, not elsewhere classified (R26.2);Other abnormalities of gait and mobility (R26.89)     Time: 1751-0258 PT Time Calculation (min) (ACUTE ONLY): 20 min  Charges:  $Therapeutic Activity: 8-22 mins                     {Madiline Saffran, PTA 07/02/22, 1:11 PM

## 2022-07-03 ENCOUNTER — Ambulatory Visit: Payer: Medicare HMO | Admitting: Dermatology

## 2022-07-03 LAB — BASIC METABOLIC PANEL
Anion gap: 7 (ref 5–15)
BUN: 21 mg/dL (ref 8–23)
CO2: 24 mmol/L (ref 22–32)
Calcium: 8.9 mg/dL (ref 8.9–10.3)
Chloride: 104 mmol/L (ref 98–111)
Creatinine, Ser: 0.67 mg/dL (ref 0.61–1.24)
GFR, Estimated: >60 mL/min (ref >60)
Glucose, Bld: 97 mg/dL (ref 70–99)
Potassium: 4.1 mmol/L (ref 3.5–5.1)
Sodium: 135 mmol/L (ref 135–145)

## 2022-07-03 LAB — CBC
HCT: 31.7 % — ABNORMAL LOW (ref 39.0–52.0)
Hemoglobin: 10.8 g/dL — ABNORMAL LOW (ref 13.0–17.0)
MCH: 34.6 pg — ABNORMAL HIGH (ref 26.0–34.0)
MCHC: 34.1 g/dL (ref 30.0–36.0)
MCV: 101.6 fL — ABNORMAL HIGH (ref 80.0–100.0)
Platelets: 221 10*3/uL (ref 150–400)
RBC: 3.12 MIL/uL — ABNORMAL LOW (ref 4.22–5.81)
RDW: 12.4 % (ref 11.5–15.5)
WBC: 4.2 10*3/uL (ref 4.0–10.5)
nRBC: 0 % (ref 0.0–0.2)

## 2022-07-03 MED ORDER — FENTANYL 25 MCG/HR TD PT72
1.0000 | MEDICATED_PATCH | TRANSDERMAL | Status: DC
  Administered 2022-07-03: 1 via TRANSDERMAL
  Filled 2022-07-03: qty 1

## 2022-07-03 NOTE — Plan of Care (Signed)

## 2022-07-03 NOTE — Progress Notes (Signed)
Patient stated that he does not want to be discharged on 07/03/2022.  Patient states that his pain is not adequately managed and he also wants to make sure that where he is getting d/c to that he would like to check to determine if the facility is proficient enough and "know what they are doing".

## 2022-07-03 NOTE — Care Management Important Message (Signed)
Important Message  Patient Details  Name: Andrew Walsh MRN: [id number] Date of Birth: [birth year]   Medicare Important Message Given:  Yes     Dannette Barbara 07/03/2022, 12:58 PM

## 2022-07-03 NOTE — Progress Notes (Signed)
Patient ID: Andrew Walsh, male   DOB: 1941/04/08, 81 y.o.   MRN: [id number]  Subjective: The patient continues to note significant pain with any attempted mobility.  He remains quite frustrated that he is only able to get from the bed to the chair with significant help and with significant pain.  He feels that his pain medication regimen is not adequately managing his pain at this time.  He denies any new injury to the hip and any new symptoms.   Objective: Vital signs in last 24 hours: Temp:  [97.4 F (36.3 C)-97.9 F (36.6 C)] 97.6 F (36.4 C) (10/23 0432) Pulse Rate:  [53-71] 53 (10/23 0432) Resp:  [16-19] 16 (10/23 0432) BP: (101-142)/(66-80) 142/80 (10/23 0432) SpO2:  [96 %-100 %] 99 % (10/23 0432)  Intake/Output from previous day: 10/22 0701 - 10/23 0700 In: 240 [P.O.:240] Out: 200 [Urine:200] Intake/Output this shift: No intake/output data recorded.  Recent Labs    07/01/22 0510 07/02/22 0451 07/03/22 0328  HGB 10.6* 11.1* 10.8*   Recent Labs    07/02/22 0451 07/03/22 0328  WBC 4.7 4.2  RBC 3.22* 3.12*  HCT 32.4* 31.7*  PLT 215 221   Recent Labs    07/02/22 0451 07/03/22 0328  NA 133* 135  K 4.0 4.1  CL 101 104  CO2 24 24  BUN 16 21  CREATININE 0.61 0.67  GLUCOSE 95 97  CALCIUM 8.6* 8.9   No results for input(s): "LABPT", "INR" in the last 72 hours.  Physical Exam: Orthopedic examination of the left hip and lower extremity remains unchanged.  He experiences moderate to severe pain with any attempted active or passive motion of the hip.  He again is grossly neurovascularly intact to the left lower extremity and foot.  Assessment: Essentially nondisplaced left periprosthetic greater trochanteric fracture.  Plan: The treatment options again are reviewed with the patient.  He will continue to work with physical therapy to try to improve his mobility.  He feels that it is imperative that he continue to receive appropriate pain medication in order to  enable him to accomplish these goals.  Therefore, he has been encouraged to discuss his pain management with the hospitalist overseeing his care.  He understands that he will need rehab placement at some point, but does not feel that he is ready to leave the acute hospital setting at this time due to his significant pain and limited mobility.  At this point, I will sign off as there is nothing more to do from an orthopedic standpoint.  Please arrange for him to follow-up in my office with either Cameron Proud, PA-C, or myself in 5 to 6 weeks for repeat x-rays.  Please reconsult me if there is any further orthopedic input necessary on this patient.   Marshall Cork Adri Schloss 07/03/2022, 8:07 AM

## 2022-07-03 NOTE — TOC Progression Note (Signed)
Transition of Care [facility]) - Progression Note    Patient Details  Name: Andrew Walsh MRN: [id number] Date of Birth: 1941-09-01  Transition of [facility] CM/SW [place], RN Phone Number: 07/03/2022, 10:15 AM  Clinical Narrative:     The patient remains in uncontrolled pain, he will not DC today  Expected Discharge Plan: Skilled Nursing Facility Barriers to Discharge: Insurance Authorization  Expected Discharge Plan and Services Expected Discharge Plan: [place]         Expected Discharge Date: 06/30/22                                     Social Determinants of Health (SDOH) Interventions    Readmission Risk Interventions     No data to display

## 2022-07-03 NOTE — Progress Notes (Signed)
Physical Therapy Treatment Patient Details Name: Andrew Walsh MRN: [id number] DOB: 07-01-1941 Today's Date: 07/03/2022   History of Present Illness Pt is an 81 year old male admitted with minimally displaced greater trochanteric fracture around what appears to be a well-positioned total hip arthroplasty which shows no evidence of loosening after fall at home; PMH significant for , hereditary hemochromatosis, requiring periodic phlebotomy sessions with IV fluid, lumbar stenosis status post lumbar decompression    PT Comments    Pt in chair.  Remembers me from yesterday's session and stated "Your Nate's partner."  He completes seated AROM x 10 prior to standing with mod a x 1 for standing AROM.  He is able to pivot step to left to practice chair <-> bed transfers.  He continues to struggle and does better to left than right which would be expected.  On return to chair he does have increased fear of falling and while safe the whole time he yells out in fear of falling.  RN in room and provides +2 for pt comfort.  Discussed at length with pt his stay and need for SNF.  He stated he has received therapy each day while here but does not consider exercises, standing and transfers "therapy".  He agrees that he is not able to tolerate any more activity at this time due to pain and we are doing all that he can handle at any given session he would like to be able to do more.  He stated he does not feel his pain is controlled and that is his barrier to progressing therapy.  Pt is encouraged to continue discussion with MD.  Pt is reassured that people with a similar fracture often experience the level of pain his is having and it is not unusual to be limited for a week or two before pain subsides and allows for more mobility.  He stated he is not refusing rehab but would like his pain more managed before discharge and have the appropriate medications available to him there.  Again reviewed with pt that many  people go to SNF in a similar condition and that pain medication will continue to be given once transferred.  RN in room for discussion.   Recommendations for follow up therapy are one component of a multi-disciplinary discharge planning process, led by the attending physician.  Recommendations may be updated based on patient status, additional functional criteria and insurance authorization.  Follow Up Recommendations  Skilled nursing-short term rehab (<3 hours/day)     Assistance Recommended at Discharge Frequent or constant Supervision/Assistance  Patient can return home with the following A little help with walking and/or transfers;A lot of help with bathing/dressing/bathroom;Assistance with cooking/housework;Direct supervision/assist for medications management;Direct supervision/assist for financial management;Assist for transportation;Help with stairs or ramp for entrance   Equipment Recommendations       Recommendations for Other Services       Precautions / Restrictions Precautions Precautions: Fall Restrictions Weight Bearing Restrictions: Yes LLE Weight Bearing: Partial weight bearing LLE Partial Weight Bearing Percentage or Pounds: 50%?     Mobility  Bed Mobility               General bed mobility comments: in chair before and after session    Transfers Overall transfer level: Needs assistance Equipment used: Rolling walker (2 wheels) Transfers: Sit to/from Stand Sit to Stand: Mod assist           General transfer comment: cues for hand placements    Ambulation/Gait  Ambulation/Gait assistance: Mod assist Gait Distance (Feet): 3 Feet Assistive device: Rolling walker (2 wheels) Gait Pattern/deviations: Step-to pattern Gait velocity: decreased   Pre-gait activities: weight shifting and static standing General Gait Details: pivots on foot - no true gait.  does better to left than right for transfer   Stairs             Wheelchair Mobility     Modified Rankin (Stroke Patients Only)       Balance Overall balance assessment: Needs assistance Sitting-balance support: Feet supported Sitting balance-Leahy Scale: Good     Standing balance support: Bilateral upper extremity supported, Reliant on assistive device for balance, During functional activity Standing balance-Leahy Scale: Poor Standing balance comment: heavy reliance on walker and standing                            Cognition Arousal/Alertness: Awake/alert Behavior During Therapy: WFL for tasks assessed/performed Overall Cognitive Status: Within Functional Limits for tasks assessed                                          Exercises Other Exercises Other Exercises: seated and standing AROM x 10    General Comments        Pertinent Vitals/Pain Pain Assessment Pain Assessment: Faces Faces Pain Scale: Hurts whole lot Pain Location: L hip - with mobility Pain Descriptors / Indicators: Aching, Discomfort, Grimacing Pain Intervention(s): Limited activity within patient's tolerance, Monitored during session, Premedicated before session, Repositioned    Home Living                          Prior Function            PT Goals (current goals can now be found in the care plan section) Progress towards PT goals: Progressing toward goals    Frequency    7X/week      PT Plan Current plan remains appropriate    Co-evaluation              AM-PAC PT "6 Clicks" Mobility   Outcome Measure  Help needed turning from your back to your side while in a flat bed without using bedrails?: A Little Help needed moving from lying on your back to sitting on the side of a flat bed without using bedrails?: A Little Help needed moving to and from a bed to a chair (including a wheelchair)?: A Lot Help needed standing up from a chair using your arms (e.g., wheelchair or bedside chair)?: A Lot Help needed to walk in hospital  room?: Total Help needed climbing 3-5 steps with a railing? : Total 6 Click Score: 12    End of Session Equipment Utilized During Treatment: Gait belt Activity Tolerance: Patient tolerated treatment well Patient left: in chair;with call bell/phone within reach;with chair alarm set   PT Visit Diagnosis: Difficulty in walking, not elsewhere classified (R26.2);Other abnormalities of gait and mobility (R26.89)     Time: 5284-1324 PT Time Calculation (min) (ACUTE ONLY): 24 min  Charges:  $Therapeutic Exercise: 8-22 mins $Therapeutic Activity: 8-22 mins                    Chesley Noon, PTA 07/03/22, 11:38 AM

## 2022-07-03 NOTE — Plan of Care (Signed)

## 2022-07-04 DIAGNOSIS — M48061 Spinal stenosis, lumbar region without neurogenic claudication: Secondary | ICD-10-CM | POA: Diagnosis not present

## 2022-07-04 DIAGNOSIS — Z743 Need for continuous supervision: Secondary | ICD-10-CM | POA: Diagnosis not present

## 2022-07-04 DIAGNOSIS — E538 Deficiency of other specified B group vitamins: Secondary | ICD-10-CM | POA: Diagnosis not present

## 2022-07-04 DIAGNOSIS — E782 Mixed hyperlipidemia: Secondary | ICD-10-CM | POA: Diagnosis not present

## 2022-07-04 DIAGNOSIS — G8929 Other chronic pain: Secondary | ICD-10-CM | POA: Diagnosis not present

## 2022-07-04 DIAGNOSIS — D528 Other folate deficiency anemias: Secondary | ICD-10-CM | POA: Diagnosis not present

## 2022-07-04 DIAGNOSIS — D539 Nutritional anemia, unspecified: Secondary | ICD-10-CM | POA: Diagnosis not present

## 2022-07-04 DIAGNOSIS — M9702XA Periprosthetic fracture around internal prosthetic left hip joint, initial encounter: Secondary | ICD-10-CM | POA: Diagnosis not present

## 2022-07-04 DIAGNOSIS — Z96649 Presence of unspecified artificial hip joint: Secondary | ICD-10-CM | POA: Diagnosis not present

## 2022-07-04 DIAGNOSIS — E7800 Pure hypercholesterolemia, unspecified: Secondary | ICD-10-CM | POA: Diagnosis not present

## 2022-07-04 DIAGNOSIS — Z8546 Personal history of malignant neoplasm of prostate: Secondary | ICD-10-CM | POA: Diagnosis not present

## 2022-07-04 DIAGNOSIS — Z7901 Long term (current) use of anticoagulants: Secondary | ICD-10-CM | POA: Diagnosis not present

## 2022-07-04 DIAGNOSIS — W19XXXA Unspecified fall, initial encounter: Secondary | ICD-10-CM | POA: Diagnosis not present

## 2022-07-04 DIAGNOSIS — R0689 Other abnormalities of breathing: Secondary | ICD-10-CM | POA: Diagnosis not present

## 2022-07-04 DIAGNOSIS — S79929A Unspecified injury of unspecified thigh, initial encounter: Secondary | ICD-10-CM | POA: Diagnosis not present

## 2022-07-04 DIAGNOSIS — S7292XA Unspecified fracture of left femur, initial encounter for closed fracture: Secondary | ICD-10-CM | POA: Diagnosis not present

## 2022-07-04 DIAGNOSIS — M1990 Unspecified osteoarthritis, unspecified site: Secondary | ICD-10-CM | POA: Diagnosis not present

## 2022-07-04 DIAGNOSIS — S72002D Fracture of unspecified part of neck of left femur, subsequent encounter for closed fracture with routine healing: Secondary | ICD-10-CM | POA: Diagnosis not present

## 2022-07-04 DIAGNOSIS — M25552 Pain in left hip: Secondary | ICD-10-CM | POA: Diagnosis not present

## 2022-07-04 DIAGNOSIS — I959 Hypotension, unspecified: Secondary | ICD-10-CM | POA: Diagnosis not present

## 2022-07-04 DIAGNOSIS — W19XXXD Unspecified fall, subsequent encounter: Secondary | ICD-10-CM | POA: Diagnosis not present

## 2022-07-04 DIAGNOSIS — S72115D Nondisplaced fracture of greater trochanter of left femur, subsequent encounter for closed fracture with routine healing: Secondary | ICD-10-CM | POA: Diagnosis not present

## 2022-07-04 DIAGNOSIS — Z7401 Bed confinement status: Secondary | ICD-10-CM | POA: Diagnosis not present

## 2022-07-04 LAB — CBC
HCT: 32.6 % — ABNORMAL LOW (ref 39.0–52.0)
Hemoglobin: 11.2 g/dL — ABNORMAL LOW (ref 13.0–17.0)
MCH: 35.1 pg — ABNORMAL HIGH (ref 26.0–34.0)
MCHC: 34.4 g/dL (ref 30.0–36.0)
MCV: 102.2 fL — ABNORMAL HIGH (ref 80.0–100.0)
Platelets: 260 10*3/uL (ref 150–400)
RBC: 3.19 MIL/uL — ABNORMAL LOW (ref 4.22–5.81)
RDW: 12.3 % (ref 11.5–15.5)
WBC: 4.6 10*3/uL (ref 4.0–10.5)
nRBC: 0 % (ref 0.0–0.2)

## 2022-07-04 LAB — BASIC METABOLIC PANEL
Anion gap: 6 (ref 5–15)
BUN: 20 mg/dL (ref 8–23)
CO2: 25 mmol/L (ref 22–32)
Calcium: 9 mg/dL (ref 8.9–10.3)
Chloride: 102 mmol/L (ref 98–111)
Creatinine, Ser: 0.7 mg/dL (ref 0.61–1.24)
GFR, Estimated: >60 mL/min (ref >60)
Glucose, Bld: 96 mg/dL (ref 70–99)
Potassium: 4 mmol/L (ref 3.5–5.1)
Sodium: 133 mmol/L — ABNORMAL LOW (ref 135–145)

## 2022-07-04 MED ORDER — OXYCODONE-ACETAMINOPHEN 7.5-325 MG PO TABS: 1.0000 | ORAL_TABLET | Freq: Four times a day (QID) | ORAL | 0 refills | Status: AC | PRN

## 2022-07-04 MED ORDER — ENOXAPARIN SODIUM 40 MG/0.4ML IJ SOSY: 40.0000 mg | PREFILLED_SYRINGE | INTRAMUSCULAR | 0 refills | Status: DC

## 2022-07-04 MED ORDER — SENNOSIDES-DOCUSATE SODIUM 8.6-50 MG PO TABS: 1.0000 | ORAL_TABLET | Freq: Every day | ORAL | Status: DC

## 2022-07-04 MED ORDER — FENTANYL 25 MCG/HR TD PT72: 1.0000 | MEDICATED_PATCH | TRANSDERMAL | 0 refills | Status: AC

## 2022-07-04 MED ORDER — DOCUSATE SODIUM 100 MG PO CAPS: 200.0000 mg | ORAL_CAPSULE | Freq: Two times a day (BID) | ORAL | Status: DC

## 2022-07-04 NOTE — Progress Notes (Addendum)
PROGRESS NOTE       Andrew Walsh       LFY:[id number] DOB: 1940/09/12 DOA: 06/27/2022 PCP: Adin Hector, MD          Assessment & Plan:   Principal Problem:   Closed left femoral fracture ([place]) Active Problems:   History of prostate cancer   Hereditary hemochromatosis ([place])   HLD (hyperlipidemia)   Lumbar stenosis   Left hip pain   Assessment and Plan: Closed left femoral fracture: no surgical intervention as per ortho surg. Pain was improved yesterday w/ fentanyl patch but today no improvement w/ fentanyl patch. Fentanyl patch increased and continue on percocet prn. Charge nurse and pt advocate will talk with pt today about not receiving "therapy" and not having his pain controlled. There are complications and consequences to taking multiple narcotics at once including respiratory depression and death. Will need outpatient ortho surg, Dr. Roland Rack, f/u in 5-6 weeks. Ortho surg signed off 07/03/22    HLD: continue on statin    Hereditary hemochromatosis: continue w/ intermittent phlebotomy sessions    Macrocytic anemia: folate, B12 are both WNL. No need for a transfusion currently        DVT prophylaxis: lovenox  Code Status: full  Family Communication:  Disposition Plan: likely d/c to SNF    Level of care: Med-Surg   Status is: Inpatient Remains inpatient appropriate because:  pain is not controlled as per pt        Consultants:  Ortho surg    Procedures:    Antimicrobials:      Subjective: Pt is angry, agitated and frustrated    Objective:       Vitals:    07/02/22 1553 07/02/22 2135 07/03/22 0432 07/03/22 0849  BP: 101/66 127/69 (!) 142/80 (!) 142/81  Pulse: 62 71 (!) 53 (!) 51  Resp: '16 19 16 17  '$ Temp: 97.7 F (36.5 C) 97.9 F (36.6 C) 97.6 F (36.4 C) 97.9 F (36.6 C)  TempSrc:          SpO2: 96% 99% 99% 100%  Weight:          Height:              Intake/Output Summary (Last 24 hours) at 07/03/2022 1431 Last data filed at 07/03/2022  1005    Gross per 24 hour  Intake 120 ml  Output 350 ml  Net -230 ml       Filed Weights    06/27/22 1410  Weight: 73.4 kg      Examination:   General exam: Appears agitated and frustrated  Respiratory system:  clear breath sounds b/l  Cardiovascular system: S1 & S2+. No rubs or clicks   Gastrointestinal system: Abd is soft, NT, ND & hypoactive bowel sounds  Central nervous system: alert and oriented. Moves all extremities  Psychiatry: judgement and insight is at baseline. Agitated and frustrated mood and affect        Data Reviewed: I have personally reviewed following labs and imaging studies   CBC: Last Labs            Recent Labs  Lab 06/27/22 1530 06/28/22 0511 06/29/22 0446 06/30/22 0648 07/01/22 0510 07/02/22 0451 07/03/22 0328  WBC 6.6   < > 6.0 6.2 6.0 4.7 4.2  NEUTROABS 5.0  --   --   --   --   --   --   HGB 12.3*   < > 11.0* 10.8* 10.6* 11.1* 10.8*  HCT 36.0*   < > 32.8* 31.7* 30.1* 32.4* 31.7*  MCV 102.3*   < > 102.8* 102.3* 100.0 100.6* 101.6*  PLT 216   < > 201 193 210 215 221   < > = values in this interval not displayed.      Basic Metabolic Panel: Last Labs          Recent Labs  Lab 06/29/22 0446 06/30/22 0648 07/01/22 0510 07/02/22 0451 07/03/22 0328  NA 136 135 132* 133* 135  K 3.9 4.0 3.9 4.0 4.1  CL 109 105 100 101 104  CO2 '23 24 23 24 24  '$ GLUCOSE 99 99 98 95 97  BUN '13 11 14 16 21  '$ CREATININE 0.55* 0.57* 0.67 0.61 0.67  CALCIUM 8.4* 8.7* 8.6* 8.6* 8.9      GFR: Estimated Creatinine Clearance: 66.6 mL/min (by C-G formula based on SCr of 0.67 mg/dL). Liver Function Tests: Last Labs   No results for input(s): "AST", "ALT", "ALKPHOS", "BILITOT", "PROT", "ALBUMIN" in the last 168 hours.   Last Labs   No results for input(s): "LIPASE", "AMYLASE" in the last 168 hours.   Last Labs   No results for input(s): "AMMONIA" in the last 168 hours.   Coagulation Profile: Last Labs   No results for input(s): "INR", "PROTIME" in the  last 168 hours.   Cardiac Enzymes: Last Labs   No results for input(s): "CKTOTAL", "CKMB", "CKMBINDEX", "TROPONINI" in the last 168 hours.   BNP (last 3 results) Recent Labs (within last 365 days)  No results for input(s): "PROBNP" in the last 8760 hours.   HbA1C: Recent Labs (last 2 labs)   No results for input(s): "HGBA1C" in the last 72 hours.   CBG: Last Labs   No results for input(s): "GLUCAP" in the last 168 hours.   Lipid Profile: Recent Labs (last 2 labs)   No results for input(s): "CHOL", "HDL", "LDLCALC", "TRIG", "CHOLHDL", "LDLDIRECT" in the last 72 hours.   Thyroid Function Tests: Recent Labs (last 2 labs)   No results for input(s): "TSH", "T4TOTAL", "FREET4", "T3FREE", "THYROIDAB" in the last 72 hours.   Anemia Panel: Recent Labs (last 2 labs)   No results for input(s): "VITAMINB12", "FOLATE", "FERRITIN", "TIBC", "IRON", "RETICCTPCT" in the last 72 hours.     Sepsis Labs: Last Labs   No results for input(s): "PROCALCITON", "LATICACIDVEN" in the last 168 hours.     No results found for this or any previous visit (from the past 240 hour(s)).            Radiology Studies: Imaging Results (Last 48 hours)  No results found.             Scheduled Meds:  enoxaparin (LOVENOX) injection  40 mg Subcutaneous Daily   feeding supplement  237 mL Oral BID BM   fentaNYL  1 patch Transdermal Q72H    Continuous Infusions:        LOS: 6 days      Time spent: 35 mins       Wyvonnia Dusky, MD [facility] Pager [phone]   If 7PM-7AM, please contact night-coverage [email or web address] 07/03/2022, 2:31 PM

## 2022-07-04 NOTE — Discharge Summary (Signed)
Physician Discharge Summary  Andrew Walsh UKG:[id number] DOB: Jan 31, 1941 DOA: 06/27/2022  PCP: Adin Hector, MD  Admit date: 06/27/2022 Discharge date: 07/04/2022  Admitted From: home  Disposition:  SNF  Recommendations for Outpatient Follow-up:  Follow up with PCP in 1-2 weeks F/u w/ ortho surg, Dr. Roland Rack, in 4-6 weeks F/u w/ neuro surg, Dr. Izora Ribas, in 6-8 weeks  Home Health: no  Equipment/Devices:  Discharge Condition: stable  CODE STATUS: full  Diet recommendation:  Regular   Brief/Interim Summary: HPI was taken from Dr. Tobie Poet: [name] is a 81 year old male with history of hyperlipidemia, hereditary hemochromatosis, requiring periodic phlebotomy sessions with IV fluid, lumbar stenosis status post lumbar decompression, who presents emergency department for chief concerns of fall with resulting left hip pain and inability to bear weight.   Initial vitals in the emergency department showed temperature 97.9, respiration rate of 18, heart rate of 49, blood pressure 177/81, SPO2 of 98% on room air.   Serum sodium is 133, potassium 4.4, chloride 105, bicarb 23, nonfasting blood glucose 111, BUN of 18, serum creatinine of 0.76, GFR greater than 60.   WBC 6.6, hemoglobin 12.3, platelets of 216.   Left hip x-ray: Was read as mildly displaced fracture proximal femoral diaphysis.  Bilateral hip replacement.   ED treatment: Morphine 4 mg IV. At bedside, he is able to tell me his name, age, current location.    He was taking his usual walk, with two walking sticks, and fell on flat surface. He fell on left side. He denies head trauma, lost of consciousness. He is currently extended home physical therapy.    He denies chest pain, shortness of breath, dysuria, hematuria, changes to his bowel habits.  He denies any fever, dizziness.   He reports that several months ago he underwent lumbar decompression and since then he has been unable to ambulate like before and has had  difficulty walking around his home.  He has had to use a rollator walker and has endured home physical therapy and has now graduated to walking sticks.  He reports that since his surgery he has had difficulty with balance.  As per Dr. Jimmye Norman 10/18-10/24/23: Pt was found to have closed left femoral fracture after a fall at home. No surgical intervention was indicated as per ortho surg. Pt will need lovenox x 14 days at least for DVT prophylaxis w/ long bone fracture. Pt will need f/u outpatient w/ ortho surg, Dr. Roland Rack, in 4-6 weeks. Pt has had a lot of pain that has been difficult to control w/ pain meds. Pt was d/c to SNF on fentanyl patch 49mg q72hrs and percocet 7.'5mg'$  q6h prn. PT/OT evaluated the pt and recommended SNF. For more information, please see previous progress/consult notes.    Discharge Diagnoses:  Principal Problem:   Closed left femoral fracture ([facility]) Active Problems:   History of prostate cancer   [place]   HLD (hyperlipidemia)   Lumbar stenosis   Left hip pain  Closed left femoral fracture: no surgical intervention as per ortho surg. Fentanyl patch increased on 07/03/22 and continue on percocet prn. Will to continue on lovenox x 14 days at least for DVT prophylaxis for a long bone fracture. Will need outpatient ortho surg, Dr. PRoland Rack f/u in 4-6 weeks. Ortho surg signed off 07/03/22    HLD: continue on statin    Hereditary hemochromatosis: continue w/ intermittent phlebotomy sessions    Macrocytic anemia: folate, B12 are both WNL. No need for  a transfusion currently   Discharge Instructions  Discharge Instructions     Diet general   Complete by: As directed    Discharge instructions   Complete by: As directed    F/u w/ PCP in 1-2 weeks. F/u w/ ortho surg, Dr. Roland Rack, in 2 weeks   Discharge instructions   Complete by: As directed    F/u w/ PCP in 1-2 weeks. F/u w/ ortho surg, Dr. Roland Rack, in 5-6 weeks. F/u w/ neuro surg, Dr. Izora Ribas, in 6-8  weeks   Increase activity slowly   Complete by: As directed    Increase activity slowly   Complete by: As directed    No wound care   Complete by: As directed    No wound care   Complete by: As directed       Allergies as of 07/04/2022   No Known Allergies      Medication List     STOP taking these medications    Vitamin A 2400 MCG (8000 UT) Caps       TAKE these medications    Calcium 600+D3 Plus Minerals 600-800 MG-UNIT Tabs Take 2 tablets by mouth daily.   enoxaparin 40 MG/0.4ML injection Commonly known as: LOVENOX Inject 0.4 mLs (40 mg total) into the skin daily for 14 days.   fentaNYL 25 MCG/HR Commonly known as: Pana 1 patch onto the skin every 3 (three) days for 3 days. Start taking on: July 06, 2022   Glucosamine HCl 1000 MG Tabs Take 2,000 mg by mouth daily.   loperamide 2 MG capsule Commonly known as: IMODIUM Take 4 mg by mouth daily.   oxyCODONE-acetaminophen 7.5-325 MG tablet Commonly known as: PERCOCET Take 1 tablet by mouth every 6 (six) hours as needed for up to 1 day for moderate pain or severe pain.   simvastatin 20 MG tablet Commonly known as: ZOCOR Take 20 mg by mouth every other day.   vitamin B-12 500 MCG tablet Commonly known as: CYANOCOBALAMIN Take 500 mcg by mouth daily.   vitamin C 1000 MG tablet Take 1,000 mg by mouth daily.        Contact information for follow-up providers     Poggi, Marshall Cork, MD Follow up.   Specialty: Orthopedic Surgery Why: F/u in 4-6 weeks Contact information: [place] [address] [phone]         Meade Maw, MD Follow up.   Specialty: Neurosurgery Why: F/u in 6-8 weeks Contact information: [address] [place] [address] [phone]         Adin Hector, MD Follow up.   Specialty: Internal Medicine Why: F/u in 1-2 weeks Contact information: [place] [address] [phone]              Contact information for after-discharge care     [facility] Preferred [facility] .   Service: Skilled Nursing Contact information: [place] [phone]                    No Known Allergies  Consultations: Ortho surg    Procedures/Studies: CT HIP LEFT WO CONTRAST  Result Date: 06/30/2022 CLINICAL DATA:  Fall.  Left hip fracture EXAM: CT OF THE LEFT HIP WITHOUT CONTRAST TECHNIQUE: Multidetector CT imaging of the left hip was performed  according to the standard protocol. Multiplanar CT image reconstructions were also generated. RADIATION DOSE REDUCTION: This exam was performed according to the departmental dose-optimization program which includes automated exposure control, adjustment of the mA and/or kV according to patient size and/or use of iterative reconstruction technique. COMPARISON:  X-ray 06/27/2022 FINDINGS: Bones/Joint/Cartilage Surgical changes from left total hip arthroplasty. Arthroplasty components are in their expected alignment. There is the acute minimally displaced periprosthetic fracture of the intertrochanteric aspect of the left femur (series 6, images 40-48). No periprosthetic fracture or lucency involving the acetabular component. No lytic or sclerotic bone lesion is evident. Ligaments Suboptimally assessed by CT. Muscles and Tendons No acute musculotendinous abnormality by CT. Soft tissues Soft tissue edema overlying the lateral aspect of the hip. No organized hematoma. Atherosclerotic vascular calcifications. Extensive colonic diverticulosis. IMPRESSION: Acute minimally displaced periprosthetic fracture of the intertrochanteric aspect of the left femur. Electronically Signed   By: Davina Poke D.O.   On: 06/30/2022 18:55   Chest Portable 1 View  Result Date:  06/27/2022 CLINICAL DATA:  Frequent falls, left hip fracture EXAM: PORTABLE CHEST 1 VIEW COMPARISON:  None Available. FINDINGS: The heart size and mediastinal contours are within normal limits given AP technique and low lung volumes. No focal pulmonary opacity. No pleural effusion or pneumothorax. No acute osseous abnormality. IMPRESSION: No acute cardiopulmonary process. Electronically Signed   By: Merilyn Baba M.D.   On: 06/27/2022 16:12   DG Hip Unilat With Pelvis 2-3 Views Left  Result Date: 06/27/2022 CLINICAL DATA:  Fall. EXAM: DG HIP (WITH OR WITHOUT PELVIS) 2-3V LEFT COMPARISON:  None Available. FINDINGS: Bilateral hip replacement in satisfactory position and alignment. Fracture proximal left femur involving the proximal diaphysis. Mild displacement. IMPRESSION: Mildly displaced fracture proximal femoral diaphysis. Bilateral hip replacement Electronically Signed   By: Franchot Gallo M.D.   On: 06/27/2022 14:46   (Echo, Carotid, EGD, Colonoscopy, ERCP)    Subjective: Pt c/o left leg pain   Discharge Exam: Vitals:   07/04/22 0004 07/04/22 0948  BP: 119/70 127/72  Pulse: 60 66  Resp: 18 17  Temp: 98 F (36.7 C) (!) 97.4 F (36.3 C)  SpO2: 97% 98%   Vitals:   07/03/22 0849 07/03/22 1641 07/04/22 0004 07/04/22 0948  BP: (!) 142/81 122/74 119/70 127/72  Pulse: (!) 51 62 60 66  Resp: '17 17 18 17  '$ Temp: 97.9 F (36.6 C) 98.3 F (36.8 C) 98 F (36.7 C) (!) 97.4 F (36.3 C)  TempSrc:      SpO2: 100% 99% 97% 98%  Weight:      Height:        General: Pt is alert, awake, not in acute distress Cardiovascular: S1/S2 +, no rubs, no gallops Respiratory: CTA bilaterally, no wheezing, no rhonchi Abdominal: Soft, NT, ND, bowel sounds + Extremities: no edema, no cyanosis    The results of significant diagnostics from this hospitalization (including imaging, microbiology, ancillary and laboratory) are listed below for reference.     Microbiology: No results found for this or  any previous visit (from the past 240 hour(s)).   Labs: BNP (last 3 results) No results for input(s): "BNP" in the last 8760 hours. Basic Metabolic Panel: Recent Labs  Lab 06/30/22 0648 07/01/22 0510 07/02/22 0451 07/03/22 0328 07/04/22 0546  NA 135 132* 133* 135 133*  K 4.0 3.9 4.0 4.1 4.0  CL 105 100 101 104 102  CO2 '24 23 24 24 25  '$ GLUCOSE 99 98 95 97 96  BUN 11  $'14 16 21 20  'L$ CREATININE 0.57* 0.67 0.61 0.67 0.70  CALCIUM 8.7* 8.6* 8.6* 8.9 9.0   Liver Function Tests: No results for input(s): "AST", "ALT", "ALKPHOS", "BILITOT", "PROT", "ALBUMIN" in the last 168 hours. No results for input(s): "LIPASE", "AMYLASE" in the last 168 hours. No results for input(s): "AMMONIA" in the last 168 hours. CBC: Recent Labs  Lab 06/27/22 1530 06/28/22 0511 06/30/22 0648 07/01/22 0510 07/02/22 0451 07/03/22 0328 07/04/22 0546  WBC 6.6   < > 6.2 6.0 4.7 4.2 4.6  NEUTROABS 5.0  --   --   --   --   --   --   HGB 12.3*   < > 10.8* 10.6* 11.1* 10.8* 11.2*  HCT 36.0*   < > 31.7* 30.1* 32.4* 31.7* 32.6*  MCV 102.3*   < > 102.3* 100.0 100.6* 101.6* 102.2*  PLT 216   < > 193 210 215 221 260   < > = values in this interval not displayed.   Cardiac Enzymes: No results for input(s): "CKTOTAL", "CKMB", "CKMBINDEX", "TROPONINI" in the last 168 hours. BNP: Invalid input(s): "POCBNP" CBG: No results for input(s): "GLUCAP" in the last 168 hours. D-Dimer No results for input(s): "DDIMER" in the last 72 hours. Hgb A1c No results for input(s): "HGBA1C" in the last 72 hours. Lipid Profile No results for input(s): "CHOL", "HDL", "LDLCALC", "TRIG", "CHOLHDL", "LDLDIRECT" in the last 72 hours. Thyroid function studies No results for input(s): "TSH", "T4TOTAL", "T3FREE", "THYROIDAB" in the last 72 hours.  Invalid input(s): "FREET3" Anemia work up No results for input(s): "VITAMINB12", "FOLATE", "FERRITIN", "TIBC", "IRON", "RETICCTPCT" in the last 72 hours. Urinalysis    Component Value  Date/Time   COLORURINE YELLOW (A) 01/18/2022 1101   APPEARANCEUR CLEAR (A) 01/18/2022 1101   APPEARANCEUR Clear 12/01/2015 1438   LABSPEC 1.013 01/18/2022 1101   PHURINE 6.0 01/18/2022 1101   GLUCOSEU NEGATIVE 01/18/2022 1101   HGBUR MODERATE (A) 01/18/2022 1101   BILIRUBINUR NEGATIVE 01/18/2022 1101   BILIRUBINUR Negative 12/01/2015 1438   KETONESUR NEGATIVE 01/18/2022 1101   PROTEINUR NEGATIVE 01/18/2022 1101   NITRITE NEGATIVE 01/18/2022 1101   LEUKOCYTESUR NEGATIVE 01/18/2022 1101   Sepsis Labs Recent Labs  Lab 07/01/22 0510 07/02/22 0451 07/03/22 0328 07/04/22 0546  WBC 6.0 4.7 4.2 4.6   Microbiology No results found for this or any previous visit (from the past 240 hour(s)).   Time coordinating discharge: Over 30 minutes  SIGNED:   Wyvonnia Dusky, MD  [facility] 07/04/2022, 12:25 PM Pager   If 7PM-7AM, please contact night-coverage [email or web address]

## 2022-07-04 NOTE — Progress Notes (Signed)
Nutrition Follow-up  DOCUMENTATION CODES:   Not applicable  INTERVENTION:   -Continue regular diet -Continue Ensure Enlive po BID, each supplement provides 350 kcal and 20 grams of protein -Continue MVI with minerals daily  NUTRITION DIAGNOSIS:   Increased nutrient needs related to hip fracture as evidenced by estimated needs.  Ongoing  GOAL:   Patient will meet greater than or equal to 90% of their needs  Progressing   MONITOR:   PO intake, Supplement acceptance, Labs, Weight trends  REASON FOR ASSESSMENT:   Consult Hip fracture protocol  ASSESSMENT:   Pt admitted after a fall leading to nondisplaced L femoral fracture. PMH significant for HLD, hereditary hemochromatosis, requiring periodic phlebotomy sessions with IV fluid, lumbar stenosis s/p lumbar decompression.  Reviewed I/O's: -1 L x 24 hours and -4.9 L since admission  UOP: 1.2 L x 24 hours   Per orthopedics notes, no plan for surgical interventions.   Pt remains on a regular diet. Pt with good oral intake. Noted meal completions 80-100%. Pt is also drinking Ensure supplements.   Medications reviewed and include senokot.   Per MD notes, plan to discharge to SNF once medically stable. Pt remains in hospital for pain management.   Labs reviewed: Na: 133.    Diet Order:   Diet Order             Diet general           Diet regular Room service appropriate? Yes; Fluid consistency: Thin  Diet effective now                   EDUCATION NEEDS:   Education needs have been addressed  Skin:  Skin Assessment: Skin Integrity Issues: Skin Integrity Issues:: Other (Comment) Other: open wound to lt arm  Last BM:  07/03/22 (type 4)  Height:   Ht Readings from Last 1 Encounters:  06/27/22 '5\' 7"'$  (1.702 m)    Weight:   Wt Readings from Last 1 Encounters:  06/27/22 73.4 kg   BMI:  Body mass index is 25.34 kg/m.  Estimated Nutritional Needs:   Kcal:  1800-2000  Protein:  90-105g  Fluid:   >/=1.8L    Loistine Chance, RD, LDN, [place] Registered Dietitian II Certified Diabetes Care and Education Specialist Please refer to [facility] for RD and/or RD on-call/weekend/after hours pager

## 2022-07-04 NOTE — Progress Notes (Signed)
Physical Therapy Treatment Patient Details Name: Andrew Walsh MRN: [id number] DOB: 07-14-1941 Today's Date: 07/04/2022   History of Present Illness Pt is an 81 year old male admitted with minimally displaced greater trochanteric fracture around what appears to be a well-positioned total hip arthroplasty which shows no evidence of loosening after fall at home; PMH significant for , hereditary hemochromatosis, requiring periodic phlebotomy sessions with IV fluid, lumbar stenosis status post lumbar decompression    PT Comments    In chair.  Oob with use of rails and is able to get legs off of bed but does need mod help getting upper body up.  Steady in sitting and he is able to stand with min a x 2 and transfer to chair at bedside.  Overall improved transfer today.  Participated in exercises as described below.  He does stand a second time at chairside with min a x 1 for about 1 minute before fatigue. Remains PWB LLE.  He does have significant arthritis in R shoulder impacting mobility and endorses poor mobility and balance at home prior to fall.    Long discussion with pt about progress and mobility.  Pt is making progress but he does not see it.  Reviewed how he did with therapy on admission vs today.  Pt voices overall frustration with mobility over the past 5 months and frustration over declining health and mobility.  Also talked about making sure we "define out terms" when talking as he often has different definitions or expectations than we do causing confusion.  Reviewed expectations for pain and recovery.  Pt voices understanding after session and seemed more aware of recovery time, pain levels and realistic expectations.   Recommendations for follow up therapy are one component of a multi-disciplinary discharge planning process, led by the attending physician.  Recommendations may be updated based on patient status, additional functional criteria and insurance authorization.  Follow Up  Recommendations  Skilled nursing-short term rehab (<3 hours/day)     Assistance Recommended at Discharge Frequent or constant Supervision/Assistance  Patient can return home with the following A little help with walking and/or transfers;A lot of help with bathing/dressing/bathroom;Assistance with cooking/housework;Direct supervision/assist for medications management;Direct supervision/assist for financial management;Assist for transportation;Help with stairs or ramp for entrance   Equipment Recommendations       Recommendations for Other Services       Precautions / Restrictions Precautions Precautions: Fall Restrictions Weight Bearing Restrictions: Yes LLE Weight Bearing: Partial weight bearing LLE Partial Weight Bearing Percentage or Pounds: 50%?     Mobility  Bed Mobility Overal bed mobility: Needs Assistance Bed Mobility: Supine to Sit     Supine to sit: Mod assist, HOB elevated          Transfers Overall transfer level: Needs assistance Equipment used: Rolling walker (2 wheels) Transfers: Sit to/from Stand Sit to Stand: Min assist   Step pivot transfers: Min assist, +2 safety/equipment       General transfer comment: cues for hand placements and encouragement.    Ambulation/Gait Ambulation/Gait assistance: Min assist, +2 safety/equipment Gait Distance (Feet): 3 Feet Assistive device: Rolling walker (2 wheels) Gait Pattern/deviations: Step-to pattern Gait velocity: decreased     General Gait Details: pivots on foot - no true gait.  overall better movement today   Stairs             Wheelchair Mobility    Modified Rankin (Stroke Patients Only)       Balance Overall balance assessment: Needs assistance Sitting-balance  support: Feet supported Sitting balance-Leahy Scale: Good     Standing balance support: Bilateral upper extremity supported, Reliant on assistive device for balance, During functional activity Standing balance-Leahy  Scale: Poor Standing balance comment: heavy reliance on walker and standing and during transfer                            Cognition Arousal/Alertness: Awake/alert Behavior During Therapy: WFL for tasks assessed/performed Overall Cognitive Status: Within Functional Limits for tasks assessed                                          Exercises Other Exercises Other Exercises: seated and standing AROM x 10    General Comments        Pertinent Vitals/Pain Pain Assessment Pain Assessment: Faces Pain Score: 8  Pain Location: L hip - with mobility,, no or minimal pain at rest. Pain Descriptors / Indicators: Aching, Discomfort, Grimacing, Sharp Pain Intervention(s): Limited activity within patient's tolerance, Monitored during session, Premedicated before session, Repositioned    Home Living                          Prior Function            PT Goals (current goals can now be found in the care plan section) Progress towards PT goals: Progressing toward goals    Frequency    7X/week      PT Plan Current plan remains appropriate    Co-evaluation              AM-PAC PT "6 Clicks" Mobility   Outcome Measure  Help needed turning from your back to your side while in a flat bed without using bedrails?: A Little Help needed moving from lying on your back to sitting on the side of a flat bed without using bedrails?: A Lot Help needed moving to and from a bed to a chair (including a wheelchair)?: A Lot Help needed standing up from a chair using your arms (e.g., wheelchair or bedside chair)?: A Little Help needed to walk in hospital room?: A Lot Help needed climbing 3-5 steps with a railing? : Total 6 Click Score: 13    End of Session Equipment Utilized During Treatment: Gait belt Activity Tolerance: Patient tolerated treatment well Patient left: in chair;with call bell/phone within reach;with chair alarm set Nurse Communication:  Mobility status PT Visit Diagnosis: Difficulty in walking, not elsewhere classified (R26.2);Other abnormalities of gait and mobility (R26.89)     Time: 7510-2585 PT Time Calculation (min) (ACUTE ONLY): 39 min  Charges:  $Therapeutic Exercise: 8-22 mins $Therapeutic Activity: 23-37 mins                   Chesley Noon, PTA 07/04/22, 10:58 AM

## 2022-07-04 NOTE — Progress Notes (Signed)
Attempted to call report to [facility] at [phone], but was unable to reach staff for report. Will continue to monitor.

## 2022-07-04 NOTE — Progress Notes (Signed)
This leader spoke to patient per request. I and Delilah at bedside along with patient's wife. Patient questions answered regarding pain management, discharge process, date of insurance authorization, plan of care, and patients involvement throughout this process. Conservation lasted appx 20 minutes with patient ending conservation asking [place] to leave the room. Patient and spouse in agreement with discharge.

## 2022-07-04 NOTE — Plan of Care (Signed)
Problem: Education: Goal: Knowledge of General Education information will improve Description: Including pain rating scale, medication(s)/side effects and non-pharmacologic comfort measures 07/04/2022 1259 by Alferd Apa, RN Outcome: Adequate for Discharge 07/04/2022 0826 by Alferd Apa, RN Outcome: Progressing   Problem: Health Behavior/Discharge Planning: Goal: Ability to manage health-related needs will improve 07/04/2022 1259 by Alferd Apa, RN Outcome: Adequate for Discharge 07/04/2022 0826 by Alferd Apa, RN Outcome: Progressing   Problem: Clinical Measurements: Goal: Ability to maintain clinical measurements within normal limits will improve 07/04/2022 1259 by Alferd Apa, RN Outcome: Adequate for Discharge 07/04/2022 0826 by Alferd Apa, RN Outcome: Progressing Goal: Will remain free from infection 07/04/2022 1259 by Alferd Apa, RN Outcome: Adequate for Discharge 07/04/2022 0826 by Alferd Apa, RN Outcome: Progressing Goal: Diagnostic test results will improve 07/04/2022 1259 by Alferd Apa, RN Outcome: Adequate for Discharge 07/04/2022 0826 by Alferd Apa, RN Outcome: Progressing Goal: Respiratory complications will improve 07/04/2022 1259 by Alferd Apa, RN Outcome: Adequate for Discharge 07/04/2022 0826 by Alferd Apa, RN Outcome: Progressing Goal: Cardiovascular complication will be avoided 07/04/2022 1259 by Alferd Apa, RN Outcome: Adequate for Discharge 07/04/2022 0826 by Alferd Apa, RN Outcome: Progressing   Problem: Activity: Goal: Risk for activity intolerance will decrease 07/04/2022 1259 by Alferd Apa, RN Outcome: Adequate for Discharge 07/04/2022 0826 by Alferd Apa, RN Outcome: Progressing   Problem: Nutrition: Goal: Adequate nutrition will be maintained 07/04/2022 1259 by Alferd Apa, RN Outcome: Adequate for Discharge 07/04/2022 0826 by Alferd Apa, RN Outcome: Progressing   Problem:  Coping: Goal: Level of anxiety will decrease 07/04/2022 1259 by Alferd Apa, RN Outcome: Adequate for Discharge 07/04/2022 0826 by Alferd Apa, RN Outcome: Progressing   Problem: Elimination: Goal: Will not experience complications related to bowel motility 07/04/2022 1259 by Alferd Apa, RN Outcome: Adequate for Discharge 07/04/2022 0826 by Alferd Apa, RN Outcome: Progressing Goal: Will not experience complications related to urinary retention 07/04/2022 1259 by Alferd Apa, RN Outcome: Adequate for Discharge 07/04/2022 0826 by Alferd Apa, RN Outcome: Progressing   Problem: Pain Managment: Goal: General experience of comfort will improve 07/04/2022 1259 by Alferd Apa, RN Outcome: Adequate for Discharge 07/04/2022 0826 by Alferd Apa, RN Outcome: Progressing   Problem: Safety: Goal: Ability to remain free from injury will improve 07/04/2022 1259 by Alferd Apa, RN Outcome: Adequate for Discharge 07/04/2022 0826 by Alferd Apa, RN Outcome: Progressing   Problem: Skin Integrity: Goal: Risk for impaired skin integrity will decrease 07/04/2022 1259 by Alferd Apa, RN Outcome: Adequate for Discharge 07/04/2022 0826 by Alferd Apa, RN Outcome: Progressing   Problem: Education: Goal: Verbalization of understanding the information provided (i.e., activity precautions, restrictions, etc) will improve 07/04/2022 1259 by Alferd Apa, RN Outcome: Adequate for Discharge 07/04/2022 0826 by Alferd Apa, RN Outcome: Progressing Goal: Individualized Educational Video(s) 07/04/2022 1259 by Alferd Apa, RN Outcome: Adequate for Discharge 07/04/2022 0826 by Alferd Apa, RN Outcome: Progressing   Problem: Activity: Goal: Ability to ambulate and perform ADLs will improve 07/04/2022 1259 by Alferd Apa, RN Outcome: Adequate for Discharge 07/04/2022 0826 by Alferd Apa, RN Outcome: Progressing   Problem: Clinical Measurements: Goal:  Postoperative complications will be avoided or minimized 07/04/2022 1259 by Alferd Apa, RN Outcome: Adequate for Discharge 07/04/2022 0826 by Alferd Apa, RN Outcome: Progressing   Problem: Self-Concept: Goal:  Ability to maintain and perform role responsibilities to the fullest extent possible will improve 07/04/2022 1259 by Burwell Bethel, Alfredo Batty, RN Outcome: Adequate for Discharge 07/04/2022 0826 by Alferd Apa, RN Outcome: Progressing   Problem: Pain Management: Goal: Pain level will decrease 07/04/2022 1259 by Alferd Apa, RN Outcome: Adequate for Discharge 07/04/2022 0826 by Alferd Apa, RN Outcome: Progressing

## 2022-07-04 NOTE — TOC Progression Note (Addendum)
Transition of Care [facility]) - Progression Note    Patient Details  Name: Andrew Walsh MRN: [id number] Date of Birth: [birth year]  [facility] CM/SW [place], RN Phone Number: 07/04/2022, 12:30 PM  Clinical Narrative:    Met with the patient and  explained that His approval to go to [facility] will expire after today, he stated that " he guessed he better go ahead an go then"  I Explained that at [place] they will work with him daily with PT and get him stronger to be able to go home,  he stated to get the Paper work done to go, I notified the Physician   Expected Discharge Plan: [place] Barriers to Discharge: Insurance Authorization  Expected Discharge Plan and Services Expected Discharge Plan: [place]         Expected Discharge Date: 07/04/22                                     Social Determinants of Health (SDOH) Interventions    Readmission Risk Interventions     No data to display

## 2022-07-04 NOTE — TOC Progression Note (Signed)
Transition of Care [facility]) - Progression Note    Patient Details  Name: DEVYNN SCHEFF MRN: [id number] Date of Birth: [birth year]  Transition [facility] Care [facility]) CM/SW [place], RN Phone Number: 07/04/2022, 1:56 PM  Clinical Narrative:     Patient going to room 508 at [facility] to transport Wife in the room and aware  Expected Discharge Plan: Skilled Nursing Facility Barriers to Discharge: Insurance Authorization  Expected Discharge Plan and Services Expected Discharge Plan: [place]         Expected Discharge Date: 07/04/22                                     Social Determinants of Health (SDOH) Interventions    Readmission Risk Interventions     No data to display

## 2022-07-04 NOTE — Plan of Care (Signed)

## 2022-07-05 ENCOUNTER — Ambulatory Visit: Payer: Medicare HMO

## 2022-07-05 ENCOUNTER — Other Ambulatory Visit: Payer: Medicare HMO

## 2022-07-07 DIAGNOSIS — M48061 Spinal stenosis, lumbar region without neurogenic claudication: Secondary | ICD-10-CM | POA: Diagnosis not present

## 2022-07-07 DIAGNOSIS — E782 Mixed hyperlipidemia: Secondary | ICD-10-CM | POA: Diagnosis not present

## 2022-07-07 DIAGNOSIS — D528 Other folate deficiency anemias: Secondary | ICD-10-CM | POA: Diagnosis not present

## 2022-07-07 DIAGNOSIS — M25552 Pain in left hip: Secondary | ICD-10-CM | POA: Diagnosis not present

## 2022-07-07 DIAGNOSIS — S72002D Fracture of unspecified part of neck of left femur, subsequent encounter for closed fracture with routine healing: Secondary | ICD-10-CM | POA: Diagnosis not present

## 2022-07-10 DIAGNOSIS — M48061 Spinal stenosis, lumbar region without neurogenic claudication: Secondary | ICD-10-CM | POA: Diagnosis not present

## 2022-07-10 DIAGNOSIS — D528 Other folate deficiency anemias: Secondary | ICD-10-CM | POA: Diagnosis not present

## 2022-07-10 DIAGNOSIS — M25552 Pain in left hip: Secondary | ICD-10-CM | POA: Diagnosis not present

## 2022-07-10 DIAGNOSIS — E782 Mixed hyperlipidemia: Secondary | ICD-10-CM | POA: Diagnosis not present

## 2022-07-10 DIAGNOSIS — S72002D Fracture of unspecified part of neck of left femur, subsequent encounter for closed fracture with routine healing: Secondary | ICD-10-CM | POA: Diagnosis not present

## 2022-07-11 DIAGNOSIS — E782 Mixed hyperlipidemia: Secondary | ICD-10-CM | POA: Diagnosis not present

## 2022-07-11 DIAGNOSIS — S72002D Fracture of unspecified part of neck of left femur, subsequent encounter for closed fracture with routine healing: Secondary | ICD-10-CM | POA: Diagnosis not present

## 2022-07-11 DIAGNOSIS — D528 Other folate deficiency anemias: Secondary | ICD-10-CM | POA: Diagnosis not present

## 2022-07-11 DIAGNOSIS — M48061 Spinal stenosis, lumbar region without neurogenic claudication: Secondary | ICD-10-CM | POA: Diagnosis not present

## 2022-07-11 DIAGNOSIS — M25552 Pain in left hip: Secondary | ICD-10-CM | POA: Diagnosis not present

## 2022-07-11 DIAGNOSIS — Z8546 Personal history of malignant neoplasm of prostate: Secondary | ICD-10-CM | POA: Diagnosis not present

## 2022-07-14 DIAGNOSIS — E782 Mixed hyperlipidemia: Secondary | ICD-10-CM | POA: Diagnosis not present

## 2022-07-14 DIAGNOSIS — M25552 Pain in left hip: Secondary | ICD-10-CM | POA: Diagnosis not present

## 2022-07-14 DIAGNOSIS — M48061 Spinal stenosis, lumbar region without neurogenic claudication: Secondary | ICD-10-CM | POA: Diagnosis not present

## 2022-07-14 DIAGNOSIS — D528 Other folate deficiency anemias: Secondary | ICD-10-CM | POA: Diagnosis not present

## 2022-07-14 DIAGNOSIS — S72002D Fracture of unspecified part of neck of left femur, subsequent encounter for closed fracture with routine healing: Secondary | ICD-10-CM | POA: Diagnosis not present

## 2022-07-17 DIAGNOSIS — E782 Mixed hyperlipidemia: Secondary | ICD-10-CM | POA: Diagnosis not present

## 2022-07-17 DIAGNOSIS — S72002D Fracture of unspecified part of neck of left femur, subsequent encounter for closed fracture with routine healing: Secondary | ICD-10-CM | POA: Diagnosis not present

## 2022-07-17 DIAGNOSIS — M48061 Spinal stenosis, lumbar region without neurogenic claudication: Secondary | ICD-10-CM | POA: Diagnosis not present

## 2022-07-17 DIAGNOSIS — D528 Other folate deficiency anemias: Secondary | ICD-10-CM | POA: Diagnosis not present

## 2022-07-17 DIAGNOSIS — M25552 Pain in left hip: Secondary | ICD-10-CM | POA: Diagnosis not present

## 2022-07-18 DIAGNOSIS — G8929 Other chronic pain: Secondary | ICD-10-CM | POA: Diagnosis not present

## 2022-07-18 DIAGNOSIS — M9702XA Periprosthetic fracture around internal prosthetic left hip joint, initial encounter: Secondary | ICD-10-CM | POA: Diagnosis not present

## 2022-07-18 DIAGNOSIS — M25552 Pain in left hip: Secondary | ICD-10-CM | POA: Diagnosis not present

## 2022-07-21 DIAGNOSIS — E782 Mixed hyperlipidemia: Secondary | ICD-10-CM | POA: Diagnosis not present

## 2022-07-21 DIAGNOSIS — D528 Other folate deficiency anemias: Secondary | ICD-10-CM | POA: Diagnosis not present

## 2022-07-21 DIAGNOSIS — M48061 Spinal stenosis, lumbar region without neurogenic claudication: Secondary | ICD-10-CM | POA: Diagnosis not present

## 2022-07-21 DIAGNOSIS — M25552 Pain in left hip: Secondary | ICD-10-CM | POA: Diagnosis not present

## 2022-07-21 DIAGNOSIS — S72002D Fracture of unspecified part of neck of left femur, subsequent encounter for closed fracture with routine healing: Secondary | ICD-10-CM | POA: Diagnosis not present

## 2022-07-25 ENCOUNTER — Encounter: Payer: Medicare HMO | Admitting: Neurosurgery

## 2022-07-31 DIAGNOSIS — M1990 Unspecified osteoarthritis, unspecified site: Secondary | ICD-10-CM | POA: Diagnosis not present

## 2022-07-31 DIAGNOSIS — M48061 Spinal stenosis, lumbar region without neurogenic claudication: Secondary | ICD-10-CM | POA: Diagnosis not present

## 2022-07-31 DIAGNOSIS — Z96643 Presence of artificial hip joint, bilateral: Secondary | ICD-10-CM | POA: Diagnosis not present

## 2022-07-31 DIAGNOSIS — E782 Mixed hyperlipidemia: Secondary | ICD-10-CM | POA: Diagnosis not present

## 2022-07-31 DIAGNOSIS — D528 Other folate deficiency anemias: Secondary | ICD-10-CM | POA: Diagnosis not present

## 2022-07-31 DIAGNOSIS — S42002D Fracture of unspecified part of left clavicle, subsequent encounter for fracture with routine healing: Secondary | ICD-10-CM | POA: Diagnosis not present

## 2022-08-02 ENCOUNTER — Inpatient Hospital Stay: Payer: Medicare HMO

## 2022-08-11 DIAGNOSIS — C61 Malignant neoplasm of prostate: Secondary | ICD-10-CM | POA: Diagnosis not present

## 2022-08-11 DIAGNOSIS — M48061 Spinal stenosis, lumbar region without neurogenic claudication: Secondary | ICD-10-CM | POA: Diagnosis not present

## 2022-08-11 DIAGNOSIS — M9702XD Periprosthetic fracture around internal prosthetic left hip joint, subsequent encounter: Secondary | ICD-10-CM | POA: Diagnosis not present

## 2022-08-14 DIAGNOSIS — M9702XA Periprosthetic fracture around internal prosthetic left hip joint, initial encounter: Secondary | ICD-10-CM | POA: Diagnosis not present

## 2022-08-14 DIAGNOSIS — Z96642 Presence of left artificial hip joint: Secondary | ICD-10-CM | POA: Diagnosis not present

## 2022-08-30 ENCOUNTER — Inpatient Hospital Stay: Payer: Medicare HMO | Attending: Oncology

## 2022-08-30 ENCOUNTER — Inpatient Hospital Stay: Payer: Medicare HMO

## 2022-08-30 DIAGNOSIS — Z8546 Personal history of malignant neoplasm of prostate: Secondary | ICD-10-CM | POA: Insufficient documentation

## 2022-08-30 LAB — CBC WITH DIFFERENTIAL/PLATELET
Abs Immature Granulocytes: 0.02 10*3/uL (ref 0.00–0.07)
Basophils Absolute: 0 10*3/uL (ref 0.0–0.1)
Basophils Relative: 1 %
Eosinophils Absolute: 0.1 10*3/uL (ref 0.0–0.5)
Eosinophils Relative: 2 %
HCT: 39.5 % (ref 39.0–52.0)
Hemoglobin: 13.8 g/dL (ref 13.0–17.0)
Immature Granulocytes: 0 %
Lymphocytes Relative: 27 %
Lymphs Abs: 1.2 10*3/uL (ref 0.7–4.0)
MCH: 35.9 pg — ABNORMAL HIGH (ref 26.0–34.0)
MCHC: 34.9 g/dL (ref 30.0–36.0)
MCV: 102.9 fL — ABNORMAL HIGH (ref 80.0–100.0)
Monocytes Absolute: 0.5 10*3/uL (ref 0.1–1.0)
Monocytes Relative: 11 %
Neutro Abs: 2.6 10*3/uL (ref 1.7–7.7)
Neutrophils Relative %: 59 %
Platelets: 205 10*3/uL (ref 150–400)
RBC: 3.84 MIL/uL — ABNORMAL LOW (ref 4.22–5.81)
RDW: 13 % (ref 11.5–15.5)
WBC: 4.5 10*3/uL (ref 4.0–10.5)
nRBC: 0 % (ref 0.0–0.2)

## 2022-08-30 MED ORDER — SODIUM CHLORIDE 0.9 % IV SOLN
Freq: Once | INTRAVENOUS | Status: AC
  Filled 2022-08-30: qty 250

## 2022-08-30 NOTE — Patient Instructions (Signed)

## 2022-10-06 ENCOUNTER — Inpatient Hospital Stay: Payer: Medicare HMO | Attending: Oncology

## 2022-10-06 DIAGNOSIS — C61 Malignant neoplasm of prostate: Secondary | ICD-10-CM

## 2022-10-06 DIAGNOSIS — Z87891 Personal history of nicotine dependence: Secondary | ICD-10-CM | POA: Diagnosis not present

## 2022-10-06 DIAGNOSIS — F1090 Alcohol use, unspecified, uncomplicated: Secondary | ICD-10-CM | POA: Insufficient documentation

## 2022-10-06 DIAGNOSIS — Z8546 Personal history of malignant neoplasm of prostate: Secondary | ICD-10-CM | POA: Diagnosis not present

## 2022-10-06 DIAGNOSIS — Z789 Other specified health status: Secondary | ICD-10-CM

## 2022-10-06 LAB — CBC WITH DIFFERENTIAL/PLATELET
Abs Immature Granulocytes: 0.03 10*3/uL (ref 0.00–0.07)
Basophils Absolute: 0 10*3/uL (ref 0.0–0.1)
Basophils Relative: 1 %
Eosinophils Absolute: 0.1 10*3/uL (ref 0.0–0.5)
Eosinophils Relative: 2 %
HCT: 40 % (ref 39.0–52.0)
Hemoglobin: 13.8 g/dL (ref 13.0–17.0)
Immature Granulocytes: 1 %
Lymphocytes Relative: 22 %
Lymphs Abs: 1.2 10*3/uL (ref 0.7–4.0)
MCH: 35.6 pg — ABNORMAL HIGH (ref 26.0–34.0)
MCHC: 34.5 g/dL (ref 30.0–36.0)
MCV: 103.1 fL — ABNORMAL HIGH (ref 80.0–100.0)
Monocytes Absolute: 0.6 10*3/uL (ref 0.1–1.0)
Monocytes Relative: 11 %
Neutro Abs: 3.4 10*3/uL (ref 1.7–7.7)
Neutrophils Relative %: 63 %
Platelets: 213 10*3/uL (ref 150–400)
RBC: 3.88 MIL/uL — ABNORMAL LOW (ref 4.22–5.81)
RDW: 13.2 % (ref 11.5–15.5)
WBC: 5.3 10*3/uL (ref 4.0–10.5)
nRBC: 0 % (ref 0.0–0.2)

## 2022-10-06 LAB — PSA: Prostatic Specific Antigen: <0.01 ng/mL (ref 0.00–4.00)

## 2022-10-06 LAB — COMPREHENSIVE METABOLIC PANEL
ALT: 18 U/L (ref 0–44)
AST: 26 U/L (ref 15–41)
Albumin: 4.1 g/dL (ref 3.5–5.0)
Alkaline Phosphatase: 59 U/L (ref 38–126)
Anion gap: 10 (ref 5–15)
BUN: 13 mg/dL (ref 8–23)
CO2: 24 mmol/L (ref 22–32)
Calcium: 9.3 mg/dL (ref 8.9–10.3)
Chloride: 98 mmol/L (ref 98–111)
Creatinine, Ser: 0.75 mg/dL (ref 0.61–1.24)
GFR, Estimated: >60 mL/min (ref >60)
Glucose, Bld: 106 mg/dL — ABNORMAL HIGH (ref 70–99)
Potassium: 4.4 mmol/L (ref 3.5–5.1)
Sodium: 132 mmol/L — ABNORMAL LOW (ref 135–145)
Total Bilirubin: 1 mg/dL (ref 0.3–1.2)
Total Protein: 7.3 g/dL (ref 6.5–8.1)

## 2022-10-06 LAB — IRON AND TIBC
Iron: 237 ug/dL — ABNORMAL HIGH (ref 45–182)
Saturation Ratios: 78 % — ABNORMAL HIGH (ref 17.9–39.5)
TIBC: 305 ug/dL (ref 250–450)
UIBC: 68 ug/dL

## 2022-10-06 LAB — FERRITIN: Ferritin: 112 ng/mL (ref 24–336)

## 2022-10-10 ENCOUNTER — Encounter: Payer: Self-pay | Admitting: Oncology

## 2022-10-10 ENCOUNTER — Inpatient Hospital Stay: Payer: Medicare HMO | Admitting: Oncology

## 2022-10-10 ENCOUNTER — Inpatient Hospital Stay: Payer: Medicare HMO

## 2022-10-10 DIAGNOSIS — Z87891 Personal history of nicotine dependence: Secondary | ICD-10-CM | POA: Diagnosis not present

## 2022-10-10 DIAGNOSIS — Z8546 Personal history of malignant neoplasm of prostate: Secondary | ICD-10-CM

## 2022-10-10 DIAGNOSIS — Z789 Other specified health status: Secondary | ICD-10-CM | POA: Diagnosis not present

## 2022-10-10 DIAGNOSIS — F1090 Alcohol use, unspecified, uncomplicated: Secondary | ICD-10-CM | POA: Diagnosis not present

## 2022-10-10 NOTE — Assessment & Plan Note (Signed)
Last ultrasound in 2022 showed no signs of liver cirrhosis. Recommend alcohol cessation.

## 2022-10-10 NOTE — Progress Notes (Signed)
Hematology/Oncology Progress note [phone] [phone]      Patient Care Team: Adin Hector, MD as PCP - General (Internal Medicine) Earlie Server, MD as Consulting Physician (Oncology)  ASSESSMENT & PLAN:   Hereditary hemochromatosis [facility]) #History of hereditary hemochromatosis.  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 We reviewed the goal of ferritin to be less than 100, preferably less than 50, iron saturation less than 45%. Iron labs are available after patient's visit.  Ferritin 112, iron saturation 78. Recommend  phlebotomy x 1 session  with IV fluid.    Alcohol use Last ultrasound in 2022 showed no signs of liver cirrhosis. Recommend alcohol cessation.  History of prostate cancer status post radiation.  Recommend patient to continue follow-up with urology in radiation oncology 10/06/2022 PSA is <0.1.   Orders Placed This Encounter  Procedures   CBC with Differential/Platelet    Standing Status:   Future    Standing Expiration Date:   10/11/2023   Comprehensive metabolic panel    Standing Status:   Future    Standing Expiration Date:   10/10/2023   Iron and TIBC    Standing Status:   Future    Standing Expiration Date:   10/11/2023   Ferritin    Standing Status:   Future    Standing Expiration Date:   10/11/2023   Follow up  Phlebotomy/IVF Labs in 4 months prior to MD/phleb+IVF   All questions were answered. The patient knows to call the clinic with any problems, questions or concerns.  Earlie Server, MD, PhD [facility] Hematology Oncology 10/10/2022    CHIEF COMPLAINTS/REASON FOR VISIT:  hemochromatosis management  HISTORY OF PRESENTING ILLNESS:  Andrew Walsh is a 82 y.o. male who was seen in consultation at the request of Adin Hector, MD for evaluation of elevated ferritin level/hemochromatosis management. Patient reports a history  hereditary hemochromatosis.  He was diagnosed in 24s by his previous  primary care provider in [place] and he has been an active blood donor every 8 to 10 weeks for many years.  Recently he was diagnosed with prostate cancer Gleason score 4+3 with a PSA of 9.4 and underwent IMRT radiation by Dr. Baruch Gouty and he finished in September 2020. He is mildly anemic and also cannot further donate blood due to recent radiation and was referred to me for management of hemochromatosis and iron overload.  Fatigue: denies He had blood work done at [facility] primary care provider's office recently. 08/19/2019, hemoglobin 14, hematocrit 40.7, MCV 105.7. Iron 254, ferritin 514,  07/18/2019, iron 259, ferritin 476, transferrin 193.5, TIBC 270, iron saturation 96  Patient was seen by gastroenterology Dr. [name] on 10/07/2020, his hematochezia was considered to be secondary to radiation-induced proctitis with telangiectasia.  Patient had a colonoscopy with [facility] on 08/09/2020. Patient was recommended to use Imodium.  As patient continues to have symptoms, he has discussed with Dr. Ricky Stabs team in early April 2022 and was advised to use Carafate enema twice daily,  INTERVAL HISTORY Andrew Walsh is a 82 y.o. male who has above history reviewed by me today presents for follow up visit for management of iron overload/hemochromatosis. Chronic lower extremity weakness since the surgery. He drinks 2-3 glasses of wine dailyl  Review of Systems  Constitutional:  Negative for appetite change, chills, fatigue, fever and unexpected weight change.  HENT:   Negative for hearing loss and voice change.   Eyes:  Negative for eye problems and icterus.  Respiratory:  Negative for chest tightness, cough and shortness of breath.   Cardiovascular:  Negative for chest pain and leg swelling.  Gastrointestinal:  Negative for abdominal distention, abdominal pain, blood in stool and diarrhea.  Endocrine: Negative for hot flashes.  Genitourinary:  Negative for difficulty urinating, dysuria and  frequency.   Musculoskeletal:  Negative for arthralgias.       Lower extremity weakness.   Skin:  Negative for itching and rash.  Neurological:  Negative for light-headedness and numbness.  Hematological:  Negative for adenopathy. Does not bruise/bleed easily.  Psychiatric/Behavioral:  Negative for confusion.     MEDICAL HISTORY:  Past Medical History:  Diagnosis Date   Actinic keratosis 01/31/2016   Left lateral neck post auricular. AK and prurigo nodularis   Actinic keratosis 01/31/2016   Left lateral neck post auricular, inferior. AK and prurigo nodularis   Actinic keratosis 12/19/2017   Left ear anti-helix   Ankle arthropathy 02/24/2014   Arthritis of foot, degenerative 10/03/2012   Overview:  Subtalar arthritis    Basal cell carcinoma 04/30/2008   Left mid dorsum nose.   Basal cell carcinoma 07/12/2009   Left post. deltoid sup. lat. edge of cancer scar.    Cancer ([place])    PROSTATE   Elevated PSA    Hereditary hemochromatosis ([place]) 11/03/2015   Overview:  Treated by regular blood donation    History of kidney stones    HLD (hyperlipidemia) 11/03/2015   Hyperlipidemia    Inguinal hernia    Localized, primary osteoarthritis of ankle or foot 02/24/2014   Osteoarthritis    Squamous cell carcinoma of skin 05/17/2010   Left forearm. KA-like pattern. EDC.   Squamous cell carcinoma of skin 12/18/2016   Right mid lat. volar forearm.WD SCC. EDC   Squamous cell carcinoma of skin 01/22/2019   Right lateral distal tricep near elbow.    Squamous cell carcinoma of skin 07/14/2019   Right forearm. WD SCC. EDC   Squamous cell carcinoma of skin 09/29/2019   Right proximal mandible. SCCis   Squamous cell carcinoma of skin 08/17/2020   Left ear sup helix    SURGICAL HISTORY: Past Surgical History:  Procedure Laterality Date   ANKLE FUSION Right 2001, 2016   BONE MARROW ASPIRATION     COLONOSCOPY WITH PROPOFOL N/A 08/09/2020   Procedure: COLONOSCOPY WITH PROPOFOL;  Surgeon: Toledo,  Benay Pike, MD;  Location: [facility] ENDOSCOPY;  Service: Gastroenterology;  Laterality: N/A;   FLEXIBLE SIGMOIDOSCOPY N/A 01/05/2021   Procedure: FLEXIBLE SIGMOIDOSCOPY;  Surgeon: Toledo, Benay Pike, MD;  Location: [facility] ENDOSCOPY;  Service: Gastroenterology;  Laterality: N/A;   FLEXIBLE SIGMOIDOSCOPY N/A 03/23/2021   Procedure: FLEXIBLE SIGMOIDOSCOPY;  Surgeon: Toledo, Benay Pike, MD;  Location: [facility] ENDOSCOPY;  Service: Gastroenterology;  Laterality: N/A;  CRYOTHERAPY PATIENT   FOOT ARTHRODESIS, SUBTALAR     HYDROCELE EXCISION / REPAIR     HYDROCELE EXCISION / REPAIR     JOINT REPLACEMENT Bilateral 02-2004/05-2010   KNEE ARTHROSCOPY     LUMBAR LAMINECTOMY/DECOMPRESSION MICRODISCECTOMY N/A 01/13/2022   Procedure: L3-S1 POSTERIOR SPIAL DECOMPRESSION;  Surgeon: Meade Maw, MD;  Location: [facility] ORS;  Service: Neurosurgery;  Laterality: N/A;   REVISION TOTAL HIP ARTHROPLASTY  2006   Right 2011, Left 2006    SOCIAL HISTORY: Social History   Socioeconomic History   Marital status: Married    Spouse name: Not on file   Number of children: Not on file   Years of education: Not on file   Highest education level: Not  on file  Occupational History   Not on file  Tobacco Use   Smoking status: Former    Years: 20.00    Types: Cigarettes    Quit date: 09/10/1979    Years since quitting: 43.1   Smokeless tobacco: Never  Vaping Use   Vaping Use: Never used  Substance and Sexual Activity   Alcohol use: Yes    Alcohol/week: 32.0 standard drinks of alcohol    Types: 16 Glasses of wine, 16 Standard drinks or equivalent per week   Drug use: Never   Sexual activity: Not Currently  Other Topics Concern   Not on file  Social History Narrative   Live at home with wife.   Social Determinants of Health   Financial Resource Strain: Not on file  Food Insecurity: No Food Insecurity (06/27/2022)   Hunger Vital Sign    Worried About Running Out of Food in the Last Year: Never true    Ran Out of Food  in the Last Year: Never true  Transportation Needs: No Transportation Needs (06/27/2022)   PRAPARE - Hydrologist (Medical): No    Lack of Transportation (Non-Medical): No  Physical Activity: Not on file  Stress: Not on file  Social Connections: Not on file  Intimate Partner Violence: Not At Risk (06/27/2022)   Humiliation, Afraid, Rape, and Kick questionnaire    Fear of Current or Ex-Partner: No    Emotionally Abused: No    Physically Abused: No    Sexually Abused: No    FAMILY HISTORY: Family History  Problem Relation Age of Onset   Hemachromatosis Sister    Bladder Cancer Neg Hx    Prostate cancer Neg Hx    Kidney cancer Neg Hx     ALLERGIES:  has No Known Allergies.  MEDICATIONS:  Current Outpatient Medications  Medication Sig Dispense Refill   Ascorbic Acid (VITAMIN C) 1000 MG tablet Take 1,000 mg by mouth daily.     Calcium Carbonate-Vit D-Min (CALCIUM 600+D3 PLUS MINERALS) 600-800 MG-UNIT TABS Take 2 tablets by mouth daily.     Glucosamine HCl 1000 MG TABS Take 2,000 mg by mouth daily.     loperamide (IMODIUM) 2 MG capsule Take 4 mg by mouth daily.     simvastatin (ZOCOR) 20 MG tablet Take 20 mg by mouth every other day.     vitamin B-12 (CYANOCOBALAMIN) 500 MCG tablet Take 500 mcg by mouth daily.     enoxaparin (LOVENOX) 40 MG/0.4ML injection Inject 0.4 mLs (40 mg total) into the skin daily for 14 days. (Patient not taking: Reported on 10/10/2022) 5.6 mL 0   No current facility-administered medications for this visit.     PHYSICAL EXAMINATION: ECOG PERFORMANCE STATUS: 1 - Symptomatic but completely ambulatory Vitals:   10/10/22 1041  BP: (!) 153/95  Pulse: 70  Temp: (!) 97.5 F (36.4 C)  SpO2: 99%   Filed Weights   10/10/22 1041  Weight: 164 lb (74.4 kg)    Physical Exam Constitutional:      General: He is not in acute distress.    Comments: He walks with a walker  HENT:     Head: Normocephalic.  Eyes:     General: No  scleral icterus.    Pupils: Pupils are equal, round, and reactive to light.  Cardiovascular:     Rate and Rhythm: Normal rate.  Pulmonary:     Effort: Pulmonary effort is normal. No respiratory distress.     Breath sounds:  No wheezing.  Abdominal:     General: Bowel sounds are normal. There is no distension.     Palpations: Abdomen is soft.     Tenderness: There is no abdominal tenderness.  Musculoskeletal:        General: No swelling or deformity. Normal range of motion.     Cervical back: Normal range of motion and neck supple.  Skin:    General: Skin is warm and dry.     Findings: No erythema or rash.  Neurological:     Mental Status: He is alert and oriented to person, place, and time. Mental status is at baseline.     Cranial Nerves: No cranial nerve deficit.  Psychiatric:        Mood and Affect: Mood normal.      LABORATORY DATA:  I have reviewed the data as listed    Latest Ref Rng & Units 10/06/2022   11:37 AM 08/30/2022    9:27 AM 07/04/2022    5:46 AM  CBC  WBC 4.0 - 10.5 K/uL 5.3  4.5  4.6   Hemoglobin 13.0 - 17.0 g/dL 13.8  13.8  11.2   Hematocrit 39.0 - 52.0 % 40.0  39.5  32.6   Platelets 150 - 400 K/uL 213  205  260       Latest Ref Rng & Units 10/06/2022   11:37 AM 07/04/2022    5:46 AM 07/03/2022    3:28 AM  CMP  Glucose 70 - 99 mg/dL 106  96  97   BUN 8 - 23 mg/dL '13  20  21   '$ Creatinine 0.61 - 1.24 mg/dL 0.75  0.70  0.67   Sodium 135 - 145 mmol/L 132  133  135   Potassium 3.5 - 5.1 mmol/L 4.4  4.0  4.1   Chloride 98 - 111 mmol/L 98  102  104   CO2 22 - 32 mmol/L '24  25  24   '$ Calcium 8.9 - 10.3 mg/dL 9.3  9.0  8.9   Total Protein 6.5 - 8.1 g/dL 7.3     Total Bilirubin 0.3 - 1.2 mg/dL 1.0     Alkaline Phos 38 - 126 U/L 59     AST 15 - 41 U/L 26     ALT 0 - 44 U/L 18        Iron/TIBC/Ferritin/ %Sat    Component Value Date/Time   IRON 237 (H) 10/06/2022 1137   TIBC 305 10/06/2022 1137   FERRITIN 112 10/06/2022 1137   IRONPCTSAT 78 (H)  10/06/2022 1137      RADIOGRAPHIC STUDIES: I have personally reviewed the radiological images as listed and agreed with the findings in the report.  No results found.

## 2022-10-10 NOTE — Assessment & Plan Note (Addendum)
status post radiation.  Recommend patient to continue follow-up with urology in radiation oncology 10/06/2022 PSA is <0.1.

## 2022-10-10 NOTE — Assessment & Plan Note (Addendum)
#  History of hereditary hemochromatosis.  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 We reviewed the goal of ferritin to be less than 100, preferably less than 50, iron saturation less than 45%. Iron labs are available after patient's visit.  Ferritin 112, iron saturation 78. Recommend  phlebotomy x 1 session  with IV fluid.

## 2022-10-11 ENCOUNTER — Inpatient Hospital Stay: Payer: Medicare HMO

## 2022-10-11 DIAGNOSIS — F1090 Alcohol use, unspecified, uncomplicated: Secondary | ICD-10-CM | POA: Diagnosis not present

## 2022-10-11 DIAGNOSIS — Z87891 Personal history of nicotine dependence: Secondary | ICD-10-CM | POA: Diagnosis not present

## 2022-10-11 DIAGNOSIS — Z8546 Personal history of malignant neoplasm of prostate: Secondary | ICD-10-CM | POA: Diagnosis not present

## 2022-10-11 MED ORDER — SODIUM CHLORIDE 0.9 % IV SOLN
Freq: Once | INTRAVENOUS | Status: AC
  Filled 2022-10-11: qty 250

## 2022-11-14 ENCOUNTER — Ambulatory Visit: Payer: Medicare HMO | Admitting: Dermatology

## 2022-11-23 DIAGNOSIS — I10 Essential (primary) hypertension: Secondary | ICD-10-CM | POA: Diagnosis not present

## 2022-11-23 DIAGNOSIS — Z8546 Personal history of malignant neoplasm of prostate: Secondary | ICD-10-CM | POA: Diagnosis not present

## 2022-11-23 DIAGNOSIS — M48061 Spinal stenosis, lumbar region without neurogenic claudication: Secondary | ICD-10-CM | POA: Diagnosis not present

## 2022-12-01 ENCOUNTER — Telehealth: Payer: Self-pay | Admitting: Oncology

## 2022-12-01 NOTE — Telephone Encounter (Signed)
Pt called and stated that Dr. [name] recommended he get his appts with Dr. Tasia Catchings and his Phleb moved up to a sooner date.Please advise.

## 2022-12-05 ENCOUNTER — Ambulatory Visit: Payer: Medicare HMO | Admitting: Dermatology

## 2022-12-05 ENCOUNTER — Encounter: Payer: Self-pay | Admitting: Dermatology

## 2022-12-05 DIAGNOSIS — C44722 Squamous cell carcinoma of skin of right lower limb, including hip: Secondary | ICD-10-CM | POA: Diagnosis not present

## 2022-12-05 DIAGNOSIS — L570 Actinic keratosis: Secondary | ICD-10-CM

## 2022-12-05 DIAGNOSIS — C4492 Squamous cell carcinoma of skin, unspecified: Secondary | ICD-10-CM

## 2022-12-05 DIAGNOSIS — L578 Other skin changes due to chronic exposure to nonionizing radiation: Secondary | ICD-10-CM

## 2022-12-05 DIAGNOSIS — D492 Neoplasm of unspecified behavior of bone, soft tissue, and skin: Secondary | ICD-10-CM

## 2022-12-05 HISTORY — DX: Squamous cell carcinoma of skin, unspecified: C44.92

## 2022-12-05 NOTE — Patient Instructions (Signed)
Wound Care Instructions  Cleanse wound gently with soap and water once a day then pat dry with clean gauze. Apply a thin coat of Petrolatum (petroleum jelly, "Vaseline") over the wound (unless you have an allergy to this). We recommend that you use a new, sterile tube of Vaseline. Do not pick or remove scabs. Do not remove the yellow or white "healing tissue" from the base of the wound.  Cover the wound with fresh, clean, nonstick gauze and secure with paper tape. You may use Band-Aids in place of gauze and tape if the wound is small enough, but would recommend trimming much of the tape off as there is often too much. Sometimes Band-Aids can irritate the skin.  You should call the office for your biopsy report after 1 week if you have not already been contacted.  If you experience any problems, such as abnormal amounts of bleeding, swelling, significant bruising, significant pain, or evidence of infection, please call the office immediately.  FOR ADULT SURGERY PATIENTS: If you need something for pain relief you may take 1 extra strength Tylenol (acetaminophen) AND 2 Ibuprofen (200mg each) together every 4 hours as needed for pain. (do not take these if you are allergic to them or if you have a reason you should not take them.) Typically, you may only need pain medication for 1 to 3 days.     Due to recent changes in healthcare laws, you may see results of your pathology and/or laboratory studies on MyChart before the doctors have had a chance to review them. We understand that in some cases there may be results that are confusing or concerning to you. Please understand that not all results are received at the same time and often the doctors may need to interpret multiple results in order to provide you with the best plan of care or course of treatment. Therefore, we ask that you please give us 2 business days to thoroughly review all your results before contacting the office for clarification. Should  we see a critical lab result, you will be contacted sooner.   If You Need Anything After Your Visit  If you have any questions or concerns for your doctor, please call our main line at [phone] and press option 4 to reach your doctor's medical assistant. If no one answers, please leave a voicemail as directed and we will return your call as soon as possible. Messages left after 4 pm will be answered the following business day.   You may also send us a message via MyChart. We typically respond to MyChart messages within 1-2 business days.  For prescription refills, please ask your pharmacy to contact our office. Our fax number is [phone].  If you have an urgent issue when the clinic is closed that cannot wait until the next business day, you can page your doctor at the number below.    Please note that while we do our best to be available for urgent issues outside of office hours, we are not available 24/7.   If you have an urgent issue and are unable to reach us, you may choose to seek medical care at your doctor's office, retail clinic, urgent care center, or emergency room.  If you have a medical emergency, please immediately call 911 or go to the emergency department.  Pager Numbers  - Dr. Kowalski: [phone]  - Dr. Moye: [phone]  - Dr. Stewart: [phone]  In the event of inclement weather, please call our main line at   [phone] for an update on the status of any delays or closures.  Dermatology Medication Tips: Please keep the boxes that topical medications come in in order to help keep track of the instructions about where and how to use these. Pharmacies typically print the medication instructions only on the boxes and not directly on the medication tubes.   If your medication is too expensive, please contact our office at [phone] option 4 or send us a message through MyChart.   We are unable to tell what your co-pay for medications will be in  advance as this is different depending on your insurance coverage. However, we may be able to find a substitute medication at lower cost or fill out paperwork to get insurance to cover a needed medication.   If a prior authorization is required to get your medication covered by your insurance company, please allow us 1-2 business days to complete this process.  Drug prices often vary depending on where the prescription is filled and some pharmacies may offer cheaper prices.  The website www.goodrx.com contains coupons for medications through different pharmacies. The prices here do not account for what the cost may be with help from insurance (it may be cheaper with your insurance), but the website can give you the price if you did not use any insurance.  - You can print the associated coupon and take it with your prescription to the pharmacy.  - You may also stop by our office during regular business hours and pick up a GoodRx coupon card.  - If you need your prescription sent electronically to a different pharmacy, notify our office through [facility] MyChart or by phone at [phone] option 4.     Si Usted Necesita Algo Despus de Su Visita  Tambin puede enviarnos un mensaje a travs de MyChart. Por lo general respondemos a los mensajes de MyChart en el transcurso de 1 a 2 das hbiles.  Para renovar recetas, por favor pida a su farmacia que se ponga en contacto con nuestra oficina. Nuestro nmero de fax es el [phone].  Si tiene un asunto urgente cuando la clnica est cerrada y que no puede esperar hasta el siguiente da hbil, puede llamar/localizar a su doctor(a) al nmero que aparece a continuacin.   Por favor, tenga en cuenta que aunque hacemos todo lo posible para estar disponibles para asuntos urgentes fuera del horario de oficina, no estamos disponibles las 24 horas del da, los 7 das de la semana.   Si tiene un problema urgente y no puede comunicarse con nosotros, puede  optar por buscar atencin mdica  en el consultorio de su doctor(a), en una clnica privada, en un centro de atencin urgente o en una sala de emergencias.  Si tiene una emergencia mdica, por favor llame inmediatamente al 911 o vaya a la sala de emergencias.  Nmeros de bper  - Dr. Kowalski: [phone]  - Dra. Moye: [phone]  - Dra. Stewart: [phone]  En caso de inclemencias del tiempo, por favor llame a nuestra lnea principal al [phone] para una actualizacin sobre el estado de cualquier retraso o cierre.  Consejos para la medicacin en dermatologa: Por favor, guarde las cajas en las que vienen los medicamentos de uso tpico para ayudarle a seguir las instrucciones sobre dnde y cmo usarlos. Las farmacias generalmente imprimen las instrucciones del medicamento slo en las cajas y no directamente en los tubos del medicamento.   Si su medicamento es muy caro, por favor, pngase en contacto con   nuestra oficina llamando al [phone] y presione la opcin 4 o envenos un mensaje a travs de MyChart.   No podemos decirle cul ser su copago por los medicamentos por adelantado ya que esto es diferente dependiendo de la cobertura de su seguro. Sin embargo, es posible que podamos encontrar un medicamento sustituto a menor costo o llenar un formulario para que el seguro cubra el medicamento que se considera necesario.   Si se requiere una autorizacin previa para que su compaa de seguros cubra su medicamento, por favor permtanos de 1 a 2 das hbiles para completar este proceso.  Los precios de los medicamentos varan con frecuencia dependiendo del lugar de dnde se surte la receta y alguna farmacias pueden ofrecer precios ms baratos.  El sitio web www.goodrx.com tiene cupones para medicamentos de diferentes farmacias. Los precios aqu no tienen en cuenta lo que podra costar con la ayuda del seguro (puede ser ms barato con su seguro), pero el sitio web puede darle el  precio si no utiliz ningn seguro.  - Puede imprimir el cupn correspondiente y llevarlo con su receta a la farmacia.  - Tambin puede pasar por nuestra oficina durante el horario de atencin regular y recoger una tarjeta de cupones de GoodRx.  - Si necesita que su receta se enve electrnicamente a una farmacia diferente, informe a nuestra oficina a travs de MyChart de [facility] o por telfono llamando al [phone] y presione la opcin 4.  

## 2022-12-05 NOTE — Progress Notes (Signed)
Follow-Up Visit   Subjective  Andrew Walsh is a 82 y.o. male who presents for the following: Spot  The patient has spots, moles and lesions to be evaluated, some may be new or changing and the patient has concerns that these could be cancer.  Spot at right leg below knee, newly noticed. Patient thought it was a scab but would not go away. No bleeding, no pain, no itch. Patient usually sees Dr. Nehemiah Massed and has a hx of SCC.    The following portions of the chart were reviewed this encounter and updated as appropriate: medications, allergies, medical history  Review of Systems:  No other skin or systemic complaints except as noted in HPI or Assessment and Plan.  Objective  Well appearing patient in no apparent distress; mood and affect are within normal limits.  A focused examination was performed of the following areas: Right leg  right lateral calf 2.0 cm eroded crusted pink plaque         Assessment & Plan   Neoplasm of skin right lateral calf  Skin / nail biopsy Type of biopsy: tangential   Informed consent: discussed and consent obtained   Timeout: patient name, date of birth, surgical site, and procedure verified   Procedure prep:  Patient was prepped and draped in usual sterile fashion Prep type:  Isopropyl alcohol Anesthesia: the lesion was anesthetized in a standard fashion   Anesthetic:  1% lidocaine w/ epinephrine 1-100,000 buffered w/ 8.4% NaHCO3 Instrument used: flexible razor blade   Hemostasis achieved with: aluminum chloride   Outcome: patient tolerated procedure well   Post-procedure details: wound care instructions given   Additional details:  Petrolatum and a pressure bandage applied  Specimen 1 - Surgical pathology Differential Diagnosis: r/o SCC  Check Margins: No 2.0 cm eroded crusted pink plaque   ACTINIC KERATOSIS Exam: thicker scaly pink papules  Actinic keratoses are precancerous spots that appear secondary to cumulative UV  radiation exposure/sun exposure over time. They are chronic with expected duration over 1 year. A portion of actinic keratoses will progress to squamous cell carcinoma of the skin. It is not possible to reliably predict which spots will progress to skin cancer and so treatment is recommended to prevent development of skin cancer.  Recommend daily broad spectrum sunscreen SPF 30+ to sun-exposed areas, reapply every 2 hours as needed.  Recommend staying in the shade or wearing long sleeves, sun glasses (UVA+UVB protection) and wide brim hats (4-inch brim around the entire circumference of the hat). Call for new or changing lesions.  Prior to procedure, discussed risks of blister formation, small wound, skin dyspigmentation, or rare scar following cryotherapy. Recommend Vaseline ointment to treated areas while healing.  Destruction Procedure Note Destruction method: cryotherapy   Informed consent: discussed and consent obtained   Lesion destroyed using liquid nitrogen: Yes   Outcome: patient tolerated procedure well with no complications   Post-procedure details: wound care instructions given   Locations: r forearm x 1, L 2nd finger x 1, L dorsal hand x 1 # of Lesions Treated: 3  Actinic Damage with PreCancerous Actinic Keratoses Patient exhibits: - Severe, confluent actinic changes with pre-cancerous actinic keratoses that is secondary to cumulative UV radiation exposure over time - Condition that is severe; chronic, not at goal. - diffuse scaly erythematous macules and papules with underlying dyspigmentation - Deferred discussion today. Will discuss treatment with topical chemotherapy vs PDT at follow up.   Return for TBSE, next available.  Graciella Belton, RMA,  am acting as scribe for Forest Gleason, MD .   Documentation: I have reviewed the above documentation for accuracy and completeness, and I agree with the above.  Forest Gleason, MD

## 2022-12-19 ENCOUNTER — Ambulatory Visit (INDEPENDENT_AMBULATORY_CARE_PROVIDER_SITE_OTHER): Payer: Medicare HMO | Admitting: Dermatology

## 2022-12-19 ENCOUNTER — Encounter: Payer: Self-pay | Admitting: Dermatology

## 2022-12-19 VITALS — BP 137/82 | HR 58

## 2022-12-19 DIAGNOSIS — T148XXA Other injury of unspecified body region, initial encounter: Secondary | ICD-10-CM

## 2022-12-19 DIAGNOSIS — C44722 Squamous cell carcinoma of skin of right lower limb, including hip: Secondary | ICD-10-CM

## 2022-12-19 DIAGNOSIS — D2371 Other benign neoplasm of skin of right lower limb, including hip: Secondary | ICD-10-CM | POA: Diagnosis not present

## 2022-12-19 DIAGNOSIS — L578 Other skin changes due to chronic exposure to nonionizing radiation: Secondary | ICD-10-CM

## 2022-12-19 DIAGNOSIS — L821 Other seborrheic keratosis: Secondary | ICD-10-CM

## 2022-12-19 DIAGNOSIS — Z85828 Personal history of other malignant neoplasm of skin: Secondary | ICD-10-CM

## 2022-12-19 DIAGNOSIS — Z1283 Encounter for screening for malignant neoplasm of skin: Secondary | ICD-10-CM

## 2022-12-19 DIAGNOSIS — S81801A Unspecified open wound, right lower leg, initial encounter: Secondary | ICD-10-CM

## 2022-12-19 DIAGNOSIS — D045 Carcinoma in situ of skin of trunk: Secondary | ICD-10-CM | POA: Diagnosis not present

## 2022-12-19 DIAGNOSIS — C44319 Basal cell carcinoma of skin of other parts of face: Secondary | ICD-10-CM

## 2022-12-19 DIAGNOSIS — L814 Other melanin hyperpigmentation: Secondary | ICD-10-CM | POA: Diagnosis not present

## 2022-12-19 DIAGNOSIS — L720 Epidermal cyst: Secondary | ICD-10-CM

## 2022-12-19 DIAGNOSIS — Z872 Personal history of diseases of the skin and subcutaneous tissue: Secondary | ICD-10-CM

## 2022-12-19 DIAGNOSIS — D489 Neoplasm of uncertain behavior, unspecified: Secondary | ICD-10-CM

## 2022-12-19 DIAGNOSIS — D2372 Other benign neoplasm of skin of left lower limb, including hip: Secondary | ICD-10-CM

## 2022-12-19 DIAGNOSIS — L570 Actinic keratosis: Secondary | ICD-10-CM

## 2022-12-19 DIAGNOSIS — D099 Carcinoma in situ, unspecified: Secondary | ICD-10-CM

## 2022-12-19 HISTORY — DX: Carcinoma in situ, unspecified: D09.9

## 2022-12-19 MED ORDER — SILVER SULFADIAZINE 1 % EX CREA
TOPICAL_CREAM | CUTANEOUS | 0 refills | Status: AC
Start: 2022-12-19 — End: ?

## 2022-12-19 NOTE — Progress Notes (Signed)
Follow-Up Visit   Subjective  Andrew Walsh is a 82 y.o. male who presents for the following: Skin Cancer Screening and Full Body Skin Exam Spots at arms, mid left back treated before doesn't go away    The patient presents for Total-Body Skin Exam (TBSE) for skin cancer screening and mole check. The patient has spots, moles and lesions to be evaluated, some may be new or changing and the patient has concerns that these could be cancer.   The following portions of the chart were reviewed this encounter and updated as appropriate: medications, allergies, medical history  Review of Systems:  No other skin or systemic complaints except as noted in HPI or Assessment and Plan.  Objective  Well appearing patient in no apparent distress; mood and affect are within normal limits.  A full examination was performed including scalp, head, eyes, ears, nose, lips, neck, chest, axillae, abdomen, back, buttocks, bilateral upper extremities, bilateral lower extremities, hands, feet, fingers, toes, fingernails, and toenails. All findings within normal limits unless otherwise noted below.   Relevant physical exam findings are noted in the Assessment and Plan.  right distal pretibia 1.2 cm firm pink plaque        right frontal hairline 0.9 cm pink papule      left mid back 1.0 cm pink plaque with cutaneous horn         Assessment & Plan   Neoplasm of uncertain behavior (3) right distal pretibia  Skin / nail biopsy Type of biopsy: tangential   Informed consent: discussed and consent obtained   Timeout: patient name, date of birth, surgical site, and procedure verified   Patient was prepped and draped in usual sterile fashion: Area prepped with isopropyl alcohol. Anesthesia: the lesion was anesthetized in a standard fashion   Anesthetic:  1% lidocaine w/ epinephrine 1-100,000 buffered w/ 8.4% NaHCO3 Instrument used: flexible razor blade   Hemostasis achieved with: aluminum  chloride   Outcome: patient tolerated procedure well   Post-procedure details: wound care instructions given   Additional details:  Mupirocin and a bandage applied  Specimen 1 - Surgical pathology Differential Diagnosis: R/o scar vs scc  Check Margins: No  2 samples included in bottle   right frontal hairline  Skin / nail biopsy Type of biopsy: tangential   Informed consent: discussed and consent obtained   Timeout: patient name, date of birth, surgical site, and procedure verified   Patient was prepped and draped in usual sterile fashion: Area prepped with isopropyl alcohol. Anesthesia: the lesion was anesthetized in a standard fashion   Anesthetic:  1% lidocaine w/ epinephrine 1-100,000 buffered w/ 8.4% NaHCO3 Instrument used: flexible razor blade   Hemostasis achieved with: aluminum chloride   Outcome: patient tolerated procedure well   Post-procedure details: wound care instructions given   Additional details:  Mupirocin and a bandage applied  Specimen 2 - Surgical pathology Differential Diagnosis: R/o bcc   Check Margins: No  left mid back  Skin / nail biopsy Type of biopsy: tangential   Informed consent: discussed and consent obtained   Timeout: patient name, date of birth, surgical site, and procedure verified   Patient was prepped and draped in usual sterile fashion: Area prepped with isopropyl alcohol. Anesthesia: the lesion was anesthetized in a standard fashion   Anesthetic:  1% lidocaine w/ epinephrine 1-100,000 buffered w/ 8.4% NaHCO3 Instrument used: flexible razor blade   Hemostasis achieved with: aluminum chloride   Outcome: patient tolerated procedure well   Post-procedure  details: wound care instructions given   Additional details:  Mupirocin and a bandage applied  R/o scar vs scc at right distal calf   R/o bcc at right frontal hairline   R/o scc at left mid back  Discussed Mohs vs ED&C for treatment on right frontal hair and right distal calf    Open wound  Related Procedures Anaerobic and Aerobic Culture   Other Procedures Placed This Encounter Specimen status report   Open wound at right lateral calf  Exam:  Crusted ulceration with small amount of purlent discharge   Treatment Plan: Culture today to infection Start silver sulfadiazine cream  Apply topically to wound at affected area daily along with vaseline and cover with bandage daily until healed.    ACTINIC KERATOSIS Exam: hypertrophic aks  Erythematous thin papules/macules with gritty scale  Actinic keratoses are precancerous spots that appear secondary to cumulative UV radiation exposure/sun exposure over time. They are chronic with expected duration over 1 year. A portion of actinic keratoses will progress to squamous cell carcinoma of the skin. It is not possible to reliably predict which spots will progress to skin cancer and so treatment is recommended to prevent development of skin cancer.  Recommend daily broad spectrum sunscreen SPF 30+ to sun-exposed areas, reapply every 2 hours as needed.  Recommend staying in the shade or wearing long sleeves, sun glasses (UVA+UVB protection) and wide brim hats (4-inch brim around the entire circumference of the hat). Call for new or changing lesions.  Treatment Plan:  Prior to procedure, discussed risks of blister formation, small wound, skin dyspigmentation, or rare scar following cryotherapy. Recommend Vaseline ointment to treated areas while healing.  Destruction Procedure Note Destruction method: cryotherapy   Informed consent: discussed and consent obtained   Lesion destroyed using liquid nitrogen: Yes   Outcome: patient tolerated procedure well with no complications   Post-procedure details: wound care instructions given   Locations: right forearm x 8 , right dorsal hand x 1, left forearm x 4, left hand x 2, left upper arm x 1 left upper arm x 7, left chest x 1, back x 8, right popliteal fossa x 1, left  superior shoulder x 3 # of Lesions Treated: 42   LENTIGINES, SEBORRHEIC KERATOSES, HEMANGIOMAS - Benign normal skin lesions - Benign-appearing - Call for any changes  MELANOCYTIC NEVI - Tan-brown and/or pink-flesh-colored symmetric macules and papules - Benign appearing on exam today - Observation - Call clinic for new or changing moles - Recommend daily use of broad spectrum spf 30+ sunscreen to sun-exposed areas.   ACTINIC DAMAGE - Chronic condition, secondary to cumulative UV/sun exposure - diffuse scaly erythematous macules with underlying dyspigmentation - Recommend daily broad spectrum sunscreen SPF 30+ to sun-exposed areas, reapply every 2 hours as needed.  - Staying in the shade or wearing long sleeves, sun glasses (UVA+UVB protection) and wide brim hats (4-inch brim around the entire circumference of the hat) are also recommended for sun protection.  - Call for new or changing lesions.  HISTORY OF SQUAMOUS CELL CARCINOMA OF THE SKIN At right lateral calf  - No evidence of recurrence today - No lymphadenopathy - Recommend regular full body skin exams - Recommend daily broad spectrum sunscreen SPF 30+ to sun-exposed areas, reapply every 2 hours as needed.  - Call if any new or changing lesions are noted between office visits  HISTORY OF BASAL CELL CARCINOMA OF THE SKIN - No evidence of recurrence today - Recommend regular full body skin exams -  Recommend daily broad spectrum sunscreen SPF 30+ to sun-exposed areas, reapply every 2 hours as needed.  - Call if any new or changing lesions are noted between office visits  HISTORY OF PRECANCEROUS ACTINIC KERATOSIS Crusted erosions at right and left hand and arms. Recommend vaseline daily to help with healing   - site(s) of PreCancerous Actinic Keratosis clear today. - these may recur and new lesions may form requiring treatment to prevent transformation into skin cancer - observe for new or changing spots and contact  [facility] for appointment if occur - photoprotection with sun protective clothing; sunglasses and broad spectrum sunscreen with SPF of at least 30 + and frequent self skin exams recommended - yearly exams by a dermatologist recommended for persons with history of PreCancerous Actinic Keratoses  SKIN CANCER SCREENING PERFORMED TODAY.    Return for 4 - 6 week ak and probable ED&C , 6 month tbse .  I, Asher Muir, CMA, am acting as scribe for Darden Dates, MD.   Documentation: I have reviewed the above documentation for accuracy and completeness, and I agree with the above.  Darden Dates, MD

## 2022-12-19 NOTE — Patient Instructions (Addendum)
For wound at right lateral calf  Start silver sulfadiazine cream  Apply topically to wound at affected area daily along with vaseline and cover with bandage daily until healed.   For wounds at arms  Recommend applying Vaseline to areas daily.       Pre-Operative Instructions  You are scheduled for a surgical procedure at [facility]. We recommend you read the following instructions. If you have any questions or concerns, please call the office at [phone].  Shower and wash the entire body with soap and water the day of your surgery paying special attention to cleansing at and around the planned surgery site.  Avoid aspirin or aspirin containing products at least fourteen (14) days prior to your surgical procedure and for at least one week (7 Days) after your surgical procedure. If you take aspirin on a regular basis for heart disease or history of stroke or for any other reason, we may recommend you continue taking aspirin but please notify us if you take this on a regular basis. Aspirin can cause more bleeding to occur during surgery as well as prolonged bleeding and bruising after surgery.   Avoid other nonsteroidal pain medications at least one week prior to surgery and at least one week prior to your surgery. These include medications such as Ibuprofen (Motrin, Advil and Nuprin), Naprosyn, Voltaren, Relafen, etc. If medications are used for therapeutic reasons, please inform us as they can cause increased bleeding or prolonged bleeding during and bruising after surgical procedures.   Please advise [place] if you are taking any "blood thinner" medications such as Coumadin or Dipyridamole or Plavix or similar medications. These cause increased bleeding and prolonged bleeding during procedures and bruising after surgical procedures. We may have to consider discontinuing these medications briefly prior to and shortly after your surgery if safe to do so.   Please inform us of all  medications you are currently taking. All medications that are taken regularly should be taken the day of surgery as you always do. Nevertheless, we need to be informed of what medications you are taking prior to surgery to know whether they will affect the procedure or cause any complications.   Please inform us of any medication allergies. Also inform us of whether you have allergies to Latex or rubber products or whether you have had any adverse reaction to Lidocaine or Epinephrine.  Please inform us of any prosthetic or artificial body parts such as artificial heart valve, joint replacements, etc., or similar condition that might require preoperative antibiotics.   We recommend avoidance of alcohol at least two weeks prior to surgery and continued avoidance for at least two weeks after surgery.   We recommend discontinuation of tobacco smoking at least two weeks prior to surgery and continued abstinence for at least two weeks after surgery.  Do not plan strenuous exercise, strenuous work or strenuous lifting for approximately four weeks after your surgery.   We request if you are unable to make your scheduled surgical appointment, please call us at least a week in advance or as soon as you are aware of a problem so that we can cancel or reschedule the appointment.   You MAY TAKE TYLENOL (acetaminophen) for pain as it is not a blood thinner.   PLEASE PLAN TO BE IN TOWN FOR TWO WEEKS FOLLOWING SURGERY, THIS IS IMPORTANT SO YOU CAN BE CHECKED FOR DRESSING CHANGES, SUTURE REMOVAL AND TO MONITOR FOR POSSIBLE COMPLICATIONS.      Cryotherapy Aftercare  Wash gently with soap and water everyday.   Apply Vaseline and Band-Aid daily until healed.   Actinic keratoses are precancerous spots that appear secondary to cumulative UV radiation exposure/sun exposure over time. They are chronic with expected duration over 1 year. A portion of actinic keratoses will progress to squamous cell carcinoma of  the skin. It is not possible to reliably predict which spots will progress to skin cancer and so treatment is recommended to prevent development of skin cancer.  Recommend daily broad spectrum sunscreen SPF 30+ to sun-exposed areas, reapply every 2 hours as needed.  Recommend staying in the shade or wearing long sleeves, sun glasses (UVA+UVB protection) and wide brim hats (4-inch brim around the entire circumference of the hat). Call for new or changing lesions.    Melanoma ABCDEs  Melanoma is the most dangerous type of skin cancer, and is the leading cause of death from skin disease.  You are more likely to develop melanoma if you: Have light-colored skin, light-colored eyes, or red or blond hair Spend a lot of time in the sun Tan regularly, either outdoors or in a tanning bed Have had blistering sunburns, especially during childhood Have a close family member who has had a melanoma Have atypical moles or large birthmarks  Early detection of melanoma is key since treatment is typically straightforward and cure rates are extremely high if we catch it early.   The first sign of melanoma is often a change in a mole or a new dark spot.  The ABCDE system is a way of remembering the signs of melanoma.  A for asymmetry:  The two halves do not match. B for border:  The edges of the growth are irregular. C for color:  A mixture of colors are present instead of an even brown color. D for diameter:  Melanomas are usually (but not always) greater than 56mm - the size of a pencil eraser. E for evolution:  The spot keeps changing in size, shape, and color.  Please check your skin once per month between visits. You can use a small mirror in front and a large mirror behind you to keep an eye on the back side or your body.   If you see any new or changing lesions before your next follow-up, please call to schedule a visit.  Please continue daily skin protection including broad spectrum sunscreen SPF  30+ to sun-exposed areas, reapplying every 2 hours as needed when you're outdoors.   Staying in the shade or wearing long sleeves, sun glasses (UVA+UVB protection) and wide brim hats (4-inch brim around the entire circumference of the hat) are also recommended for sun protection.    Due to recent changes in healthcare laws, you may see results of your pathology and/or laboratory studies on MyChart before the doctors have had a chance to review them. We understand that in some cases there may be results that are confusing or concerning to you. Please understand that not all results are received at the same time and often the doctors may need to interpret multiple results in order to provide you with the best plan of care or course of treatment. Therefore, we ask that you please give [place] 2 business days to thoroughly review all your results before contacting the office for clarification. Should we see a critical lab result, you will be contacted sooner.   If You Need Anything After Your Visit  If you have any questions or concerns for your doctor, please call  our main line at [phone] and press option 4 to reach your doctor's medical assistant. If no one answers, please leave a voicemail as directed and we will return your call as soon as possible. Messages left after 4 pm will be answered the following business day.   You may also send us a message via MyChart. We typically respond to MyChart messages within 1-2 business days.  For prescription refills, please ask your pharmacy to contact our office. Our fax number is [phone].  If you have an urgent issue when the clinic is closed that cannot wait until the next business day, you can page your doctor at the number below.    Please note that while we do our best to be available for urgent issues outside of office hours, we are not available 24/7.   If you have an urgent issue and are unable to reach us, you may choose to seek medical care at  your doctor's office, retail clinic, urgent care center, or emergency room.  If you have a medical emergency, please immediately call 911 or go to the emergency department.  Pager Numbers  - Dr. Gwen PoundsKowalski: [phone]  - Dr. Neale BurlyMoye: [phone]  - Dr. Roseanne RenoStewart: [phone]  In the event of inclement weather, please call our main line at [phone] for an update on the status of any delays or closures.  Dermatology Medication Tips: Please keep the boxes that topical medications come in in order to help keep track of the instructions about where and how to use these. Pharmacies typically print the medication instructions only on the boxes and not directly on the medication tubes.   If your medication is too expensive, please contact our office at [phone] option 4 or send us a message through MyChart.   We are unable to tell what your co-pay for medications will be in advance as this is different depending on your insurance coverage. However, we may be able to find a substitute medication at lower cost or fill out paperwork to get insurance to cover a needed medication.   If a prior authorization is required to get your medication covered by your insurance company, please allow us 1-2 business days to complete this process.  Drug prices often vary depending on where the prescription is filled and some pharmacies may offer cheaper prices.  The website www.goodrx.com contains coupons for medications through different pharmacies. The prices here do not account for what the cost may be with help from insurance (it may be cheaper with your insurance), but the website can give you the price if you did not use any insurance.  - You can print the associated coupon and take it with your prescription to the pharmacy.  - You may also stop by our office during regular business hours and pick up a GoodRx coupon card.  - If you need your prescription sent electronically to a different pharmacy,  notify our office through [facility] MyChart or by phone at [phone] option 4.     Si Usted Necesita Algo Despus de Su Visita  Tambin puede enviarnos un mensaje a travs de Clinical cytogeneticistMyChart. Por lo general respondemos a los mensajes de MyChart en el transcurso de 1 a 2 das hbiles.  Para renovar recetas, por favor pida a su farmacia que se ponga en contacto con nuestra oficina. Annie SableNuestro nmero de fax es [place] [phone].  Si tiene un asunto urgente cuando la clnica est cerrada y que no puede esperar hasta el siguiente da hbil, [place]uede  llamar/localizar a su doctor(a) al nmero que aparece a continuacin.   Por favor, tenga en cuenta que aunque hacemos todo lo posible para estar disponibles para asuntos urgentes fuera del horario de [place], no estamos disponibles las 24 horas del da, los 7 [address]  [address] de la [place].   Si tiene un problema urgente y no puede comunicarse con nosotros, puede optar por buscar atencin mdica  en el consultorio de su doctor(a), en una clnica privada, en un centro de atencin urgente o en una sala de emergencias.  Si tiene Engineer, drilling, por favor llame inmediatamente al 911 o vaya a la sala de emergencias.  Nmeros de bper  - Dr. Gwen Pounds: [phone]  - Dra. Moye: [phone]  - Dra. Roseanne Reno: [phone]  En caso de inclemencias del [place], por favor llame a Lacy Duverney principal al [phone] para una actualizacin sobre el [place] de cualquier retraso o cierre.  Consejos para la medicacin en dermatologa: Por favor, guarde las cajas en las que vienen los medicamentos de uso tpico para ayudarle a seguir las instrucciones sobre dnde y cmo usarlos. Las farmacias generalmente imprimen las instrucciones del medicamento slo en las cajas y no directamente en los tubos del [place].   Si su medicamento es muy caro, por favor, pngase en contacto con Rolm Gala llamando al [phone] y presione la opcin 4 o envenos un mensaje a travs de  Clinical cytogeneticist.   No podemos decirle cul ser su copago por los medicamentos por adelantado ya que esto es diferente dependiendo de la cobertura de su seguro. Sin embargo, es posible que podamos encontrar un medicamento sustituto a Audiological scientist un formulario para que el seguro cubra el medicamento que se considera necesario.   Si se requiere una autorizacin previa para que su compaa de seguros [place] su medicamento, por favor permtanos de 1 a 2 das hbiles para completar [address]  Los precios de los medicamentos varan con frecuencia dependiendo del Environmental consultant de dnde se surte la receta y alguna farmacias pueden ofrecer precios ms baratos.  El sitio web www.goodrx.com tiene cupones para medicamentos de Health and safety inspector. Los precios aqu no tienen en cuenta lo que podra costar con la ayuda del seguro (puede ser ms barato con su seguro), pero el sitio web puede darle el precio si no utiliz Tourist information centre manager.  - Puede imprimir el cupn correspondiente y llevarlo con su receta a la farmacia.  - Tambin puede pasar por nuestra oficina durante el horario de atencin regular y Education officer, museum una tarjeta de cupones de GoodRx.  - Si necesita que su receta se enve electrnicamente a una farmacia diferente, informe a nuestra oficina a travs de MyChart de [place] o por telfono llamando al [phone] y presione la opcin 4.

## 2022-12-23 LAB — ANAEROBIC AND AEROBIC CULTURE

## 2022-12-23 LAB — SPECIMEN STATUS REPORT

## 2022-12-27 ENCOUNTER — Telehealth: Payer: Self-pay

## 2022-12-27 DIAGNOSIS — C44310 Basal cell carcinoma of skin of unspecified parts of face: Secondary | ICD-10-CM

## 2022-12-27 MED ORDER — CEPHALEXIN 500 MG PO CAPS: 500.0000 mg | ORAL_CAPSULE | Freq: Three times a day (TID) | ORAL | 0 refills | Status: AC

## 2022-12-27 NOTE — Telephone Encounter (Signed)
Discussed pathology results and C&S results. Patient voiced understanding. Will refer to [facility] for Mohs for [facility], [facility] for [facility] 01/24/2023. Cephalexin 500 mg 1 capsule 3 times a day for 5 days sent in for wound infection.

## 2022-12-27 NOTE — Telephone Encounter (Signed)
-----   Message from Sandi Mealy, MD sent at 12/26/2022  1:49 PM EDT ----- 1. 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 It leaves a round white scar. No additional pathology is done. If the skin cancer were to come back, we would need to do a surgery to remove it.   Mohs involves cutting out right around the spot and then checking under the microscope to be sure the whole skin cancer is out. The cure rate is about 98-99%. It is done at another office outside of [place] ([place], [place], or [place]). Once the Mohs surgeon confirms the skin cancer is out, they will discuss the options to repair or heal the area. You must take it easy for about two weeks after surgery (no lifting over 10-15 lbs, avoid activity to get your heart rate and blood pressure up).   2. Skin , right frontal hairline BASAL CELL CARCINOMA, NODULAR PATTERN --> Mohs recommended over [facility]  ED&C has about an 85% cure rate and leaves a round wound the size of the skin cancer which is healed with ointment and a bandage over a few weeks time. It leaves a round white scar. No additional pathology is done. If the skin cancer were to come back, we would need to do a surgery to remove it.   Mohs involves cutting out right around the spot and then checking under the microscope to be sure the whole skin cancer is out. The cure rate is about 98-99%. It is done at another office outside of [place] ([place], [place], or [place]). Once the Mohs surgeon confirms the skin cancer is out, they will discuss the options to repair or heal the area. You must take it easy for about two weeks after surgery (no lifting over 10-15 lbs, avoid activity to get your heart rate and blood pressure up).    3. Skin , left mid back SQUAMOUS CELL CARCINOMA IN SITU, HYPERTROPHIC AND EPIDERMOID CYST --> ED&C   Culture from wound showed MSSA staphylococcus bacteria. Recommend cephalexin 500 mg 3 times per day for 5 days.   MAs please call with results and refer or schedule. Please let me know if they have any questions. Thank you!

## 2023-01-02 ENCOUNTER — Ambulatory Visit (INDEPENDENT_AMBULATORY_CARE_PROVIDER_SITE_OTHER): Payer: Medicare HMO | Admitting: Dermatology

## 2023-01-02 ENCOUNTER — Encounter: Payer: Self-pay | Admitting: Dermatology

## 2023-01-02 VITALS — BP 137/82

## 2023-01-02 DIAGNOSIS — C44722 Squamous cell carcinoma of skin of right lower limb, including hip: Secondary | ICD-10-CM

## 2023-01-02 DIAGNOSIS — L988 Other specified disorders of the skin and subcutaneous tissue: Secondary | ICD-10-CM | POA: Diagnosis not present

## 2023-01-02 DIAGNOSIS — C4492 Squamous cell carcinoma of skin, unspecified: Secondary | ICD-10-CM

## 2023-01-02 MED ORDER — MUPIROCIN 2 % EX OINT: TOPICAL_OINTMENT | CUTANEOUS | 1 refills | Status: DC

## 2023-01-02 NOTE — Progress Notes (Unsigned)
   Follow-Up Visit   Subjective  Andrew Walsh is a 82 y.o. male who presents for the following: Excision of bx proven SCC at right lateral calf.   The following portions of the chart were reviewed this encounter and updated as appropriate: medications, allergies, medical history  Review of Systems:  No other skin or systemic complaints except as noted in HPI or Assessment and Plan.  Objective  Well appearing patient in no apparent distress; mood and affect are within normal limits.  A focused examination was performed of the following areas: Right leg Relevant physical exam findings are noted in the Assessment and Plan.   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  Skin excision  Lesion length (cm):  2.7 Lesion width (cm):  2.7 Margin per side (cm):  0.5 Total excision diameter (cm):  3.7 Informed consent: discussed and consent obtained   Timeout: patient name, date of birth, surgical site, and procedure verified   Procedure prep:  Patient was prepped and draped in usual sterile fashion Prep type:  Chlorhexidine Anesthesia: the lesion was anesthetized in a standard fashion   Anesthetic:  1% lidocaine w/ epinephrine 1-100,000 buffered w/ 8.4% NaHCO3 (12 cc) Instrument used: #15 blade   Instrument used comment:  3 curette prior to excision Hemostasis achieved with: pressure and electrodesiccation    Skin repair Complexity:  Complex Final length (cm):  4.7 Informed consent: discussed and consent obtained   Timeout: patient name, date of birth, surgical site, and procedure verified   Procedure prep:  Patient was prepped and draped in usual sterile fashion Prep type:  Chlorhexidine Anesthesia: the lesion was anesthetized in a standard fashion   Anesthetic:  1% lidocaine w/ epinephrine 1-100,000 local infiltration Reason for type of repair: reduce tension to allow closure, reduce the risk of dehiscence,  infection, and necrosis, allow closure of the large defect, allow side-to-side closure without requiring a flap or graft and enhance both functionality and cosmetic results   Undermining: edges undermined and area extensively undermined   Subcutaneous layers (deep stitches):  Suture size:  2-0 and 3-0 Suture type: Vicryl (polyglactin 910)   Fine/surface layer approximation (top stitches):  Suture size:  3-0 and 4-0 Suture type: Prolene (polypropylene)   Suture removal (days):  9 Hemostasis achieved with: suture, pressure and electrodesiccation Outcome: patient tolerated procedure well with no complications   Post-procedure details: wound care instructions given   Additional details:  Mupirocin and a pressure dressing applied  Specimen 1 - Surgical pathology Differential Diagnosis: BX proven WELL DIFFERENTIATED SQUAMOUS CELL CARCINOMA TAG AT 12 o'clock Check Margins: yes [id number] Pink biopsy site  Discussed Mohs vs excision.  Excision preferred due to travel.     Return in about 9 days (around 01/11/2023) for Suture Removal.  Anise Salvo, RMA, am acting as scribe for [place] Dates, MD .   Documentation: I have reviewed the above documentation for accuracy and completeness, and I agree with the above.  [place] Dates, MD

## 2023-01-02 NOTE — Patient Instructions (Addendum)
Recommend Hibaclens or [facility] Hypochlorous Spray to clean   Wound Care Instructions for After Surgery  On the day following your surgery, you should begin doing daily dressing changes until your sutures are removed: Remove the bandage. Cleanse the wound gently with soap and water.  Make sure you then dry the skin surrounding the wound completely or the tape will not stick to the skin. Do not use cotton balls on the wound. After the wound is clean and dry, apply the ointment (either prescription antibiotic prescribed by your doctor or plain Vaseline if nothing was prescribed) gently with a Q-tip. If you are using a bandaid to cover: Apply a bandaid large enough to cover the entire wound. If you do not have a bandaid large enough to cover the wound OR if you are sensitive to bandaid adhesive: Cut a non-stick pad (such as Telfa) to fit the size of the wound.  Cover the wound with the non-stick pad. If the wound is draining, you may want to add a small amount of gauze on top of the non-stick pad for a little added compression to the area. Use tape to seal the area completely.  For the next 1-2 weeks: Be sure to keep the wound moist with ointment 24/7 to ensure best healing. If you are unable to cover the wound with a bandage to hold the ointment in place, you may need to reapply the ointment several times a day. Do not bend over or lift heavy items to reduce the chance of elevated blood pressure to the wound. Do not participate in particularly strenuous activities.  Below is a list of dressing supplies you might need.  Cotton-tipped applicators - Q-tips Gauze pads (2x2 and/or 4x4) - All-Purpose Sponges New and clean tube of petroleum jelly (Vaseline) OR prescription antibiotic ointment if prescribed Either a bandaid large enough to cover the entire wound OR non-stick dressing material (Telfa) and Tape (Paper or Hypafix)  FOR ADULT SURGERY PATIENTS: If you need something for pain relief,  you may take 1 extra strength Tylenol (acetaminophen) and 2 ibuprofen (200 mg) together every 4 hours as needed. (Do not take these medications if you are allergic to them or if you know you cannot take them for any other reason). Typically you may only need pain medication for 1-3 days.   Comments on the Post-Operative Period Slight swelling and redness often appear around the wound. This is normal and will disappear within several days following the surgery. The healing wound will drain a brownish-red-yellow discharge during healing. This is a normal phase of wound healing. As the wound begins to heal, the drainage may increase in amount. Again, this drainage is normal. Notify us if the drainage becomes persistently bloody, excessively swollen, or intensely painful or develops a foul odor or red streaks.  The healing wound will also typically be itchy. This is normal. If you have severe or persistent pain, Notify us if the discomfort is severe or persistent. Avoid alcoholic beverages when taking pain medicine.  In Case of Wound Hemorrhage A wound hemorrhage is when the bandage suddenly becomes soaked with bright red blood and flows profusely. If this happens, sit down or lie down with your head elevated. If the wound has a dressing on it, do not remove the dressing. Apply pressure to the existing gauze. If the wound is not covered, use a gauze pad to apply pressure and continue applying the pressure for 20 minutes without peeking. DO NOT COVER THE WOUND WITH A  LARGE TOWEL OR WASH CLOTH. Release your hand from the wound site but do not remove the dressing. If the bleeding has stopped, gently clean around the wound. Leave the dressing in place for 24 hours if possible. This wait time allows the blood vessels to close off so that you do not spark a new round of bleeding by disrupting the newly clotted blood vessels with an immediate dressing change. If the bleeding does not subside, continue to hold  pressure for 40 minutes. If bleeding continues, page your physician, contact an After Hours clinic or go to the Emergency Room.  Due to recent changes in healthcare laws, you may see results of your pathology and/or laboratory studies on MyChart before the doctors have had a chance to review them. We understand that in some cases there may be results that are confusing or concerning to you. Please understand that not all results are received at the same time and often the doctors may need to interpret multiple results in order to provide you with the best plan of care or course of treatment. Therefore, we ask that you please give [place] 2 business days to thoroughly review all your results before contacting the office for clarification. Should we see a critical lab result, you will be contacted sooner.   If You Need Anything After Your Visit  If you have any questions or concerns for your doctor, please call our main line at [phone] and press option 4 to reach your doctor's medical assistant. If no one answers, please leave a voicemail as directed and we will return your call as soon as possible. Messages left after 4 pm will be answered the following business day.   You may also send [place] a message via MyChart. We typically respond to MyChart messages within 1-2 business days.  For prescription refills, please ask your pharmacy to contact our office. Our fax number is [phone].  If you have an urgent issue when the clinic is closed that cannot wait until the next business day, you can page your doctor at the number below.    Please note that while we do our best to be available for urgent issues outside of office hours, we are not available 24/7.   If you have an urgent issue and are unable to reach [place], you may choose to seek medical care at your doctor's office, retail clinic, urgent care center, or emergency room.  If you have a medical emergency, please immediately call 911 or go to the  emergency department.  Pager Numbers  - Dr. Gwen Pounds: [phone]  - Dr. Neale Burly: [phone]  - Dr. Roseanne Reno: [phone]  In the event of inclement weather, please call our main line at [phone] for an update on the status of any delays or closures.  Dermatology Medication Tips: Please keep the boxes that topical medications come in in order to help keep track of the instructions about where and how to use these. Pharmacies typically print the medication instructions only on the boxes and not directly on the medication tubes.   If your medication is too expensive, please contact our office at [phone] option 4 or send [place] a message through MyChart.   We are unable to tell what your co-pay for medications will be in advance as this is different depending on your insurance coverage. However, we may be able to find a substitute medication at lower cost or fill out paperwork to get insurance to cover a needed medication.   If a  prior authorization is required to get your medication covered by your insurance company, please allow us 1-2 business days to complete this process.  Drug prices often vary depending on where the prescript[place] is filled and some pharmacies may offer cheaper prices.  The website www.goodrx.com contains coupons for medications through different pharmacies. The prices here do not account for what the cost may be with help from insurance (it may be cheaper with your insurance), but the website can give you the price if you did not use any insurance.  - You can print the associated coupon and take it with your prescription to the pharmacy.  - You may also stop by our office during regular business hours and pick up a GoodRx coupon card.  - If you need your prescription sent electronically to a different pharmacy, notify our office through [facility] or by phone at [phone] option 4.     Si Usted Necesita Algo Despus de Su Visita  Tambin puede  enviarnos un mensaje a travs de Clinical cytogeneticist. Por lo general respondemos a los mensajes de MyChart en el transcurso de 1 a 2 das hbiles.  Para renovar recetas, por favor pida a su farmacia que se ponga en contacto con nuestra oficina. Annie Sable de fax es [place] [phone].  Si tiene un asunto urgente cuando la clnica est cerrada y que no puede esperar hasta el siguiente da hbil, puede llamar/localizar a su doctor(a) al nmero que aparece a continuacin.   Por favor, tenga en cuenta que aunque hacemos todo lo posible para estar disponibles para asuntos urgentes fuera del horario de [place], no estamos disponibles las 24 horas del da, los 7 [address]  [address] de la [place].   Si tiene un problema urgente y no puede comunicarse con nosotros, puede optar por buscar atencin mdica  en el consultorio de su doctor(a), en una clnica privada, en un centro de atencin urgente o en una sala de emergencias.  Si tiene Engineer, drilling, por favor llame inmediatamente al 911 o vaya a la sala de emergencias.  Nmeros de bper  - Dr. Gwen Pounds: [phone]  - Dra. Moye: [phone]  - Dra. Roseanne Reno: [phone]  En caso de inclemencias del [place], por favor llame a Lacy Duverney principal al [phone] para una actualizacin sobre el [place] de cualquier retraso o cierre.  Consejos para la medicacin en dermatologa: Por favor, guarde las cajas en las que vienen los medicamentos de uso tpico para ayudarle a seguir las instrucciones sobre dnde y cmo usarlos. Las farmacias generalmente imprimen las instrucciones del medicamento slo en las cajas y no directamente en los tubos del [place].   Si su medicamento es muy caro, por favor, pngase en contacto con Rolm Gala llamando al [phone] y presione la opcin 4 o envenos un mensaje a travs de Clinical cytogeneticist.   No podemos decirle cul ser su copago por los medicamentos por adelantado ya que esto es diferente dependiendo de la cobertura de su seguro.  Sin embargo, es posible que podamos encontrar un medicamento sustituto a Audiological scientist un formulario para que el seguro cubra el medicamento que se considera necesario.   Si se requiere una autorizacin previa para que su compaa de seguros [place] su medicamento, por favor permtanos de 1 a 2 das hbiles para completar [address]  Los precios de los medicamentos varan con frecuencia dependiendo del Environmental consultant de dnde se surte la receta y alguna farmacias pueden ofrecer precios ms baratos.  El sitio web www.goodrx.com tiene cupones  para medicamentos de diferentes farmacias. Los precios aqu no tienen en cuenta lo que podra costar con la ayuda del seguro (puede ser ms barato con su seguro), pero el sitio web puede darle el precio si no utiliz ningn seguro.  - Puede imprimir el cupn correspondiente y llevarlo con su receta a la farmacia.  - Tambin puede pasar por nuestra oficina durante el horario de atencin regular y recoger una tarjeta de cupones de GoodRx.  - Si necesita que su receta se enve electrnicamente a una farmacia diferente, informe a nuestra oficina a travs de MyChart de [place] o por telfono llamando al [phone] y presione la opcin 4.  

## 2023-01-03 DIAGNOSIS — Z0100 Encounter for examination of eyes and vision without abnormal findings: Secondary | ICD-10-CM | POA: Diagnosis not present

## 2023-01-03 DIAGNOSIS — H2513 Age-related nuclear cataract, bilateral: Secondary | ICD-10-CM | POA: Diagnosis not present

## 2023-01-05 ENCOUNTER — Inpatient Hospital Stay: Payer: Medicare HMO | Attending: Oncology

## 2023-01-05 DIAGNOSIS — Z8546 Personal history of malignant neoplasm of prostate: Secondary | ICD-10-CM | POA: Diagnosis not present

## 2023-01-05 LAB — CBC WITH DIFFERENTIAL/PLATELET
Abs Immature Granulocytes: 0.04 10*3/uL (ref 0.00–0.07)
Basophils Absolute: 0 10*3/uL (ref 0.0–0.1)
Basophils Relative: 1 %
Eosinophils Absolute: 0.1 10*3/uL (ref 0.0–0.5)
Eosinophils Relative: 2 %
HCT: 40.2 % (ref 39.0–52.0)
Hemoglobin: 14.2 g/dL (ref 13.0–17.0)
Immature Granulocytes: 1 %
Lymphocytes Relative: 22 %
Lymphs Abs: 1.2 10*3/uL (ref 0.7–4.0)
MCH: 36 pg — ABNORMAL HIGH (ref 26.0–34.0)
MCHC: 35.3 g/dL (ref 30.0–36.0)
MCV: 102 fL — ABNORMAL HIGH (ref 80.0–100.0)
Monocytes Absolute: 0.6 10*3/uL (ref 0.1–1.0)
Monocytes Relative: 11 %
Neutro Abs: 3.4 10*3/uL (ref 1.7–7.7)
Neutrophils Relative %: 63 %
Platelets: 216 10*3/uL (ref 150–400)
RBC: 3.94 MIL/uL — ABNORMAL LOW (ref 4.22–5.81)
RDW: 12.1 % (ref 11.5–15.5)
WBC: 5.3 10*3/uL (ref 4.0–10.5)
nRBC: 0 % (ref 0.0–0.2)

## 2023-01-05 LAB — IRON AND TIBC
Iron: 154 ug/dL (ref 45–182)
Saturation Ratios: 55 % — ABNORMAL HIGH (ref 17.9–39.5)
TIBC: 281 ug/dL (ref 250–450)
UIBC: 127 ug/dL

## 2023-01-05 LAB — COMPREHENSIVE METABOLIC PANEL
ALT: 15 U/L (ref 0–44)
AST: 22 U/L (ref 15–41)
Albumin: 4.3 g/dL (ref 3.5–5.0)
Alkaline Phosphatase: 55 U/L (ref 38–126)
Anion gap: 9 (ref 5–15)
BUN: 14 mg/dL (ref 8–23)
CO2: 23 mmol/L (ref 22–32)
Calcium: 9.1 mg/dL (ref 8.9–10.3)
Chloride: 98 mmol/L (ref 98–111)
Creatinine, Ser: 0.72 mg/dL (ref 0.61–1.24)
GFR, Estimated: >60 mL/min (ref >60)
Glucose, Bld: 113 mg/dL — ABNORMAL HIGH (ref 70–99)
Potassium: 4.3 mmol/L (ref 3.5–5.1)
Sodium: 130 mmol/L — ABNORMAL LOW (ref 135–145)
Total Bilirubin: 0.9 mg/dL (ref 0.3–1.2)
Total Protein: 7.4 g/dL (ref 6.5–8.1)

## 2023-01-05 LAB — FERRITIN: Ferritin: 162 ng/mL (ref 24–336)

## 2023-01-08 ENCOUNTER — Telehealth: Payer: Self-pay

## 2023-01-08 DIAGNOSIS — C4441 Basal cell carcinoma of skin of scalp and neck: Secondary | ICD-10-CM | POA: Diagnosis not present

## 2023-01-08 NOTE — Telephone Encounter (Signed)
Called patient. N/A. Unable to leave message. Will discuss at appointment for suture removal.

## 2023-01-08 NOTE — Telephone Encounter (Signed)
-----   Message from Sandi Mealy, MD sent at 01/04/2023 11:02 PM EDT ----- Skin (M), right lateral calf EXCISION, NO RESIDUAL SQUAMOUS CELL CARCINOMA, MARGINS FREE  Entire lesion appears to be out. No additional treatment needed at this time. Please call our office [phone] with any questions before suture removal.     MAs please call. Thank you!

## 2023-01-11 ENCOUNTER — Encounter: Payer: Self-pay | Admitting: Oncology

## 2023-01-11 ENCOUNTER — Inpatient Hospital Stay: Payer: Medicare HMO | Attending: Oncology | Admitting: Oncology

## 2023-01-11 ENCOUNTER — Inpatient Hospital Stay: Payer: Medicare HMO

## 2023-01-11 ENCOUNTER — Ambulatory Visit (INDEPENDENT_AMBULATORY_CARE_PROVIDER_SITE_OTHER): Payer: Medicare HMO | Admitting: Dermatology

## 2023-01-11 ENCOUNTER — Encounter: Payer: Self-pay | Admitting: Dermatology

## 2023-01-11 DIAGNOSIS — Z8546 Personal history of malignant neoplasm of prostate: Secondary | ICD-10-CM | POA: Insufficient documentation

## 2023-01-11 DIAGNOSIS — D7589 Other specified diseases of blood and blood-forming organs: Secondary | ICD-10-CM | POA: Diagnosis not present

## 2023-01-11 DIAGNOSIS — E871 Hypo-osmolality and hyponatremia: Secondary | ICD-10-CM | POA: Insufficient documentation

## 2023-01-11 DIAGNOSIS — Z789 Other specified health status: Secondary | ICD-10-CM

## 2023-01-11 DIAGNOSIS — Z4802 Encounter for removal of sutures: Secondary | ICD-10-CM

## 2023-01-11 MED ORDER — SODIUM CHLORIDE 0.9 % IV SOLN
Freq: Once | INTRAVENOUS | Status: AC
  Filled 2023-01-11: qty 250

## 2023-01-11 NOTE — Assessment & Plan Note (Signed)
#  History of hereditary hemochromatosis.  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 We reviewed the goal of ferritin to be less than 100, preferably less than 50, iron saturation less than 45%. Lab Results  Component Value Date   IRON 154 01/05/2023   TIBC 281 01/05/2023   IRONPCTSAT 55 (H) 01/05/2023   FERRITIN 162 01/05/2023     Recommend  phlebotomy x 1 today, and repeat another phlebotomy in June 2024

## 2023-01-11 NOTE — Patient Instructions (Signed)
Continue washing once daily with soap and water, apply Vaseline Jelly and bandage until healed    Due to recent changes in healthcare laws, you may see results of your pathology and/or laboratory studies on MyChart before the doctors have had a chance to review them. We understand that in some cases there may be results that are confusing or concerning to you. Please understand that not all results are received at the same time and often the doctors may need to interpret multiple results in order to provide you with the best plan of care or course of treatment. Therefore, we ask that you please give [place] 2 business days to thoroughly review all your results before contacting the office for clarification. Should we see a critical lab result, you will be contacted sooner.   If You Need Anything After Your Visit  If you have any questions or concerns for your doctor, please call our main line at [phone] and press option 4 to reach your doctor's medical assistant. If no one answers, please leave a voicemail as directed and we will return your call as soon as possible. Messages left after 4 pm will be answered the following business day.   You may also send [place] a message via MyChart. We typically respond to MyChart messages within 1-2 business days.  For prescription refills, please ask your pharmacy to contact our office. Our fax number is [phone].  If you have an urgent issue when the clinic is closed that cannot wait until the next business day, you can page your doctor at the number below.    Please note that while we do our best to be available for urgent issues outside of office hours, we are not available 24/7.   If you have an urgent issue and are unable to reach [place], you may choose to seek medical care at your doctor's office, retail clinic, urgent care center, or emergency room.  If you have a medical emergency, please immediately call 911 or go to the emergency department.  Pager  Numbers  - Dr. Gwen Pounds: [phone]  - Dr. Neale Burly: [phone]  - Dr. Roseanne Reno: [phone]  In the event of inclement weather, please call our main line at [phone] for an update on the status of any delays or closures.  Dermatology Medication Tips: Please keep the boxes that topical medications come in in order to help keep track of the instructions about where and how to use these. Pharmacies typically print the medication instructions only on the boxes and not directly on the medication tubes.   If your medication is too expensive, please contact our office at [phone] option 4 or send [place] a message through MyChart.   We are unable to tell what your co-pay for medications will be in advance as this is different depending on your insurance coverage. However, we may be able to find a substitute medication at lower cost or fill out paperwork to get insurance to cover a needed medication.   If a prior authorization is required to get your medication covered by your insurance company, please allow [place] 1-2 business days to complete this process.  Drug prices often vary depending on where the prescription is filled and some pharmacies may offer cheaper prices.  The website www.goodrx.com contains coupons for medications through different pharmacies. The prices here do not account for what the cost may be with help from insurance (it may be cheaper with your insurance), but the website can give you the price if  you did not use any insurance.  - You can print the associated coupon and take it with your prescription to the pharmacy.  - You may also stop by our office during regular business hours and pick up a GoodRx coupon card.  - If you need your prescription sent electronically to a different pharmacy, notify our office through [facility] or by phone at [phone] option 4.     Si Usted Necesita Algo Despus de Su Visita  Tambin puede enviarnos un mensaje a travs de  Pharmacist, community. Por lo general respondemos a los mensajes de MyChart en el transcurso de 1 a 2 das hbiles.  Para renovar recetas, por favor pida a su farmacia que se ponga en contacto con nuestra oficina. Harland Dingwall de fax es [place] [phone].  Si tiene un asunto urgente cuando la clnica est cerrada y que no puede esperar hasta el siguiente da hbil, puede llamar/localizar a su doctor(a) al nmero que aparece a continuacin.   Por favor, tenga en cuenta que aunque hacemos todo lo posible para estar disponibles para asuntos urgentes fuera del horario de [place], no estamos disponibles las 24 horas del da, los 7 das de la [place].   Si tiene un problema urgente y no puede comunicarse con nosotros, puede optar por buscar atencin mdica  en el consultorio de su doctor(a), en una clnica privada, en un centro de atencin urgente o en una sala de emergencias.  Si tiene Engineering geologist, por favor llame inmediatamente al 911 o vaya a la sala de emergencias.  Nmeros de bper  - Dr. Nehemiah Massed: [phone]  - Dra. Moye: [phone]  - Dra. Nicole Kindred: [phone]  En caso de inclemencias del [place], por favor llame a Johnsie Kindred principal al [phone] para una actualizacin sobre el [place] de cualquier retraso o cierre.  Consejos para la medicacin en dermatologa: Por favor, guarde las cajas en las que vienen los medicamentos de uso tpico para ayudarle a seguir las instrucciones sobre dnde y cmo usarlos. Las farmacias generalmente imprimen las instrucciones del medicamento slo en las cajas y no directamente en los tubos del [place].   Si su medicamento es muy caro, por favor, pngase en contacto con Zigmund Daniel llamando al [phone] y presione la opcin 4 o envenos un mensaje a travs de Pharmacist, community.   No podemos decirle cul ser su copago por los medicamentos por adelantado ya que esto es diferente dependiendo de la cobertura de su seguro. Sin embargo, es posible que  podamos encontrar un medicamento sustituto a Electrical engineer un formulario para que el seguro cubra el medicamento que se considera necesario.   Si se requiere una autorizacin previa para que su compaa de seguros [place] su medicamento, por favor permtanos de 1 a 2 das hbiles para completar este proceso.  Los precios de los medicamentos varan con frecuencia dependiendo del Environmental consultant de dnde se surte la receta y alguna farmacias pueden ofrecer precios ms baratos.  El sitio web www.goodrx.com tiene cupones para medicamentos de Airline pilot. Los precios aqu no tienen en cuenta lo que podra costar con la ayuda del seguro (puede ser ms barato con su seguro), pero el sitio web puede darle el precio si no utiliz Research scientist (physical sciences).  - Puede imprimir el cupn correspondiente y llevarlo con su receta a la farmacia.  - Tambin puede pasar por nuestra oficina durante el horario de atencin regular y Charity fundraiser una tarjeta de cupones de GoodRx.  - Si necesita que su receta  se enve electrnicamente a una farmacia diferente, informe a nuestra oficina a travs de MyChart de [place] o por telfono llamando al [phone] y presione la opcin 4.

## 2023-01-11 NOTE — Assessment & Plan Note (Signed)
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  Other differential diagnosis includes bone marrow disorders, B12 deficiency.  Will check B12 at the next visit.

## 2023-01-11 NOTE — Progress Notes (Signed)
   Follow-Up Visit   Subjective  Andrew Walsh is a 82 y.o. male who presents for the following: Suture removal. Right calf  Pathology showed NO RESIDUAL SQUAMOUS CELL CARCINOMA, MARGINS FREE   The following portions of the chart were reviewed this encounter and updated as appropriate: medications, allergies, medical history  Review of Systems:  No other skin or systemic complaints except as noted in HPI or Assessment and Plan.  Objective  Well appearing patient in no apparent distress; mood and affect are within normal limits.  Areas Examined: Right calf Relevant physical exam findings are noted in the Assessment and Plan.    Assessment & Plan   Encounter for removal of sutures   Encounter for Removal of Sutures - Incision site is clean, dry. Slight dehiscence. - Wound cleansed, sutures removed, wound cleansed  - Discussed pathology results showing NO RESIDUAL SQUAMOUS CELL CARCINOMA, MARGINS FREE  - Scars remodel for a full year. - Continue washing once daily with soap and water, apply Vaseline Jelly and bandage until healed - Patient advised to call with any concerns or if they notice any new or changing lesions.  Return for TBSE As Scheduled.  VIRGINA Rylei Masella

## 2023-01-11 NOTE — Assessment & Plan Note (Addendum)
Chronic for him.  Possible secondary to alcohol use.  Observation. Recommend patient to have a repeat chemistry lab when he sees primary care provider the next few months.

## 2023-01-11 NOTE — Assessment & Plan Note (Signed)
Last ultrasound in 2022 showed no signs of liver cirrhosis. Encourage alcohol cessation effort

## 2023-01-11 NOTE — Patient Instructions (Signed)

## 2023-01-11 NOTE — Progress Notes (Signed)
Hematology/Oncology Progress note [phone] [phone]     CHIEF COMPLAINTS/REASON FOR VISIT:  hemochromatosis management   ASSESSMENT & PLAN:   Hereditary hemochromatosis (HCC) #History of hereditary hemochromatosis.  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 We reviewed the goal of ferritin to be less than 100, preferably less than 50, iron saturation less than 45%. Lab Results  Component Value Date   IRON 154 01/05/2023   TIBC 281 01/05/2023   IRONPCTSAT 55 (H) 01/05/2023   FERRITIN 162 01/05/2023     Recommend  phlebotomy x 1 today, and repeat another phlebotomy in June 2024  Alcohol use Last ultrasound in 2022 showed no signs of liver cirrhosis. Encourage alcohol cessation effort  Macrocytosis without anemia Possibly secondary to alcohol effect on bone marrow.  Other differential diagnosis includes bone marrow disorders, B12 deficiency.  Will check B12 at the next visit.  Hyponatremia Chronic for him.  Possible secondary to alcohol use.  Observation. Recommend patient to have a repeat chemistry lab when he sees primary care provider the next few months.   Orders Placed This Encounter  Procedures   CBC with Differential (Cancer Center Only)    Standing Status:   Future    Standing Expiration Date:   01/11/2024   CMP (Cancer Center only)    Standing Status:   Future    Standing Expiration Date:   01/11/2024   Iron and TIBC    Standing Status:   Future    Standing Expiration Date:   01/11/2024   Ferritin    Standing Status:   Future    Standing Expiration Date:   01/11/2024   Follow-up Labs in 4 months prior to MD/phleb+IVF   All questions were answered. The patient knows to call the clinic with any problems, questions or concerns.  Rickard Patience, MD, PhD [facility] Hematology Oncology 01/11/2023      HISTORY OF PRESENTING ILLNESS:  Andrew Walsh is a 82 y.o. male presents for homozygous hemochromatosis management.   Patient reports a history  hereditary hemochromatosis.  He was diagnosed in 86s by his previous primary care provider in [place] and he has been an active blood donor every 8 to 10 weeks for many years.  Recently he was diagnosed with prostate cancer Gleason score 4+3 with a PSA of 9.4 and underwent IMRT radiation by Dr. Rushie Chestnut and he finished in September 2020. He is mildly anemic and also cannot further donate blood due to recent radiation and was referred to me for management of hemochromatosis and iron overload.  Fatigue: denies He had blood work done at [facility] primary care provider's office recently. 08/19/2019, hemoglobin 14, hematocrit 40.7, MCV 105.7. Iron 254, ferritin 514,  07/18/2019, iron 259, ferritin 476, transferrin 193.5, TIBC 270, iron saturation 96  Patient was seen by gastroenterology Dr. Norma Fredrickson on 10/07/2020, his hematochezia was considered to be secondary to radiation-induced proctitis with telangiectasia.  Patient had a colonoscopy with [facility] on 08/09/2020. Patient was recommended to use Imodium.  As patient continues to have symptoms, he has discussed with Dr. Horace Porteous team in early April 2022 and was advised to use Carafate enema twice daily,  INTERVAL HISTORY Andrew Walsh is a 82 y.o. male who has above history reviewed by me today presents for follow up visit for management of iron overload/hemochromatosis. Patient reports cutting down on alcohol consumption.   Patient has been following up with dermatology for cutaneous squamous cell carcinoma,  Status post resection  Review of Systems  Constitutional:  Negative for appetite change, chills, fatigue, fever and unexpected weight change.  HENT:   Negative for hearing loss and voice change.   Eyes:  Negative for eye problems and icterus.  Respiratory:  Negative for chest tightness, cough and shortness of breath.   Cardiovascular:  Negative for chest pain and leg swelling.  Gastrointestinal:  Negative for  abdominal distention, abdominal pain, blood in stool and diarrhea.  Endocrine: Negative for hot flashes.  Genitourinary:  Negative for difficulty urinating, dysuria and frequency.   Musculoskeletal:  Negative for arthralgias.  Skin:  Negative for itching and rash.  Neurological:  Negative for light-headedness and numbness.  Hematological:  Negative for adenopathy. Does not bruise/bleed easily.  Psychiatric/Behavioral:  Negative for confusion.     MEDICAL HISTORY:  Past Medical History:  Diagnosis Date   Actinic keratosis 01/31/2016   Left lateral neck post auricular. AK and prurigo nodularis   Actinic keratosis 01/31/2016   Left lateral neck post auricular, inferior. AK and prurigo nodularis   Actinic keratosis 12/19/2017   Left ear anti-helix   Ankle arthropathy 02/24/2014   Arthritis of foot, degenerative 10/03/2012   Overview:  Subtalar arthritis    Basal cell carcinoma 04/30/2008   Left mid dorsum nose.   Basal cell carcinoma 07/12/2009   Left post. deltoid sup. lat. edge of cancer scar.    Basal cell carcinoma 12/19/2022   Right frontal hair line. Nodular. Mohs pending.   Cancer (HCC)    PROSTATE   Elevated PSA    Hereditary hemochromatosis (HCC) 11/03/2015   Overview:  Treated by regular blood donation    History of kidney stones    HLD (hyperlipidemia) 11/03/2015   Hyperlipidemia    Inguinal hernia    Localized, primary osteoarthritis of ankle or foot 02/24/2014   Osteoarthritis    SCC (squamous cell carcinoma) 12/05/2022   right lateral calf, excised 01/03/23   Squamous cell carcinoma in situ 12/19/2022   Left mid back. SCCis, hypertrophic and epidermat cyst. EDC pending.   Squamous cell carcinoma of skin 05/17/2010   Left forearm. KA-like pattern. EDC.   Squamous cell carcinoma of skin 12/18/2016   Right mid lat. volar forearm.WD SCC. EDC   Squamous cell carcinoma of skin 01/22/2019   Right lateral distal tricep near elbow.    Squamous cell carcinoma of skin  07/14/2019   Right forearm. WD SCC. EDC   Squamous cell carcinoma of skin 09/29/2019   Right proximal mandible. SCCis   Squamous cell carcinoma of skin 08/17/2020   Left ear sup helix   Squamous cell carcinoma of skin 12/19/2022   Right distal pretibia. WD SCC with superficial infiltration and angiokeratoma. EDC pending.    SURGICAL HISTORY: Past Surgical History:  Procedure Laterality Date   ANKLE FUSION Right 2001, 2016   BONE MARROW ASPIRATION     COLONOSCOPY WITH PROPOFOL N/A 08/09/2020   Procedure: COLONOSCOPY WITH PROPOFOL;  Surgeon: Toledo, Boykin Nearing, MD;  Location: [facility] END[facility];  Service: Gastroenterology;  Laterality: N/A;   FLEXIBLE SIGMOIDOSCOPY N/A 01/05/2021   Procedure: FLEXIBLE SIGMOIDOSCOPY;  Surgeon: Toledo, Boykin Nearing, MD;  Location: [facility] ENDOSCOPY;  Service: Gastroenterology;  Laterality: N/A;   FLEXIBLE SIGMOIDOSCOPY N/A 03/23/2021   Procedure: FLEXIBLE SIGMOIDOSCOPY;  Surgeon: Toledo, Boykin Nearing, MD;  Location: [facility] ENDOSCOPY;  Service: Gastroenterology;  Laterality: N/A;  CRYOTHERAPY PATIENT   FOOT ARTHRODESIS, SUBTALAR     HYDROCELE EXCISION / REPAIR     HYDROCELE EXCISION / REPAIR     JOINT REPLACEMENT Bilateral  02-2004/05-2010   KNEE ARTHROSCOPY     LUMBAR LAMINECTOMY/DECOMPRESSION MICRODISCECTOMY N/A 01/13/2022   Procedure: L3-S1 POSTERIOR SPIAL DECOMPRESSION;  Surgeon: Venetia Night, MD;  Location: [facility] ORS;  Service: Neurosurgery;  Laterality: N/A;   REVISION TOTAL HIP ARTHROPLASTY  2006   Right 2011, Left 2006    SOCIAL HISTORY: Social History   Socioeconomic History   Marital status: Married    Spouse name: Not on file   Number of children: Not on file   Years of education: Not on file   Highest education level: Not on file  Occupational History   Not on file  Tobacco Use   Smoking status: Former    Years: 20    Types: Cigarettes    Quit date: 09/10/1979    Years since quitting: 43.3   Smokeless tobacco: Never  Vaping Use   Vaping Use:  Never used  Substance and Sexual Activity   Alcohol use: Yes    Alcohol/week: 32.0 standard drinks of alcohol    Types: 16 Glasses of wine, 16 Standard drinks or equivalent per week   Drug use: Never   Sexual activity: Not Currently  Other Topics Concern   Not on file  Social History Narrative   Live at home with wife.   Social Determinants of Health   Financial Resource Strain: Not on file  Food Insecurity: No Food Insecurity (06/27/2022)   Hunger Vital Sign    Worried About Running Out of Food in the Last Year: Never true    Ran Out of Food in the Last Year: Never true  Transportation Needs: No Transportation Needs (06/27/2022)   PRAPARE - Administrator, Civil Service (Medical): No    Lack of Transportation (Non-Medical): No  Physical Activity: Not on file  Stress: Not on file  Social Connections: Not on file  Intimate Partner Violence: Not At Risk (06/27/2022)   Humiliation, Afraid, Rape, and Kick questionnaire    Fear of Current or Ex-Partner: No    Emotionally Abused: No    Physically Abused: No    Sexually Abused: No    FAMILY HISTORY: Family History  Problem Relation Age of Onset   Hemachromatosis Sister    Bladder Cancer Neg Hx    Prostate cancer Neg Hx    Kidney cancer Neg Hx     ALLERGIES:  has No Known Allergies.  MEDICATIONS:  Current Outpatient Medications  Medication Sig Dispense Refill   Ascorbic Acid (VITAMIN C) 1000 MG tablet Take 1,000 mg by mouth daily.     Calcium Carbonate-Vit D-Min (CALCIUM 600+D3 PLUS MINERALS) 600-800 MG-UNIT TABS Take 2 tablets by mouth daily.     Glucosamine HCl 1000 MG TABS Take 2,000 mg by mouth daily.     loperamide (IMODIUM) 2 MG capsule Take 4 mg by mouth daily.     mupirocin ointment (BACTROBAN) 2 % Apply once daily with bandage change 22 g 1   silver sulfADIAZINE (SILVADENE) 1 % cream Apply topically to wound at affected area daily along with vaseline and cover with bandage daily until healed. 50 g 0    simvastatin (ZOCOR) 20 MG tablet Take 20 mg by mouth every other day.     vitamin B-12 (CYANOCOBALAMIN) 500 MCG tablet Take 500 mcg by mouth daily.     enoxaparin (LOVENOX) 40 MG/0.4ML injection Inject 0.4 mLs (40 mg total) into the skin daily for 14 days. (Patient not taking: Reported on 10/10/2022) 5.6 mL 0   No current facility-administered medications for  this visit.     PHYSICAL EXAMINATION: ECOG PERFORMANCE STATUS: 1 - Symptomatic but completely ambulatory Vitals:   01/11/23 1404  BP: (!) 174/90  Pulse: 62  Resp: 18  Temp: (!) 97.2 F (36.2 C)  SpO2: 96%   Filed Weights   01/11/23 1404  Weight: 165 lb 8 oz (75.1 kg)    Physical Exam Constitutional:      General: He is not in acute distress.    Comments: He walks with a walker  HENT:     Head: Normocephalic.  Eyes:     General: No scleral icterus.    Pupils: Pupils are equal, round, and reactive to light.  Cardiovascular:     Rate and Rhythm: Normal rate.  Pulmonary:     Effort: Pulmonary effort is normal. No respiratory distress.     Breath sounds: No wheezing.  Abdominal:     General: Bowel sounds are normal. There is no distension.     Palpations: Abdomen is soft.     Tenderness: There is no abdominal tenderness.  Musculoskeletal:        General: No swelling or deformity. Normal range of motion.     Cervical back: Normal range of motion and neck supple.  Skin:    General: Skin is warm and dry.     Findings: No erythema or rash.  Neurological:     Mental Status: He is alert and oriented to person, place, and time. Mental status is at baseline.     Cranial Nerves: No cranial nerve deficit.  Psychiatric:        Mood and Affect: Mood normal.      LABORATORY DATA:  I have reviewed the data as listed    Latest Ref Rng & Units 01/05/2023    9:56 AM 10/06/2022   11:37 AM 08/30/2022    9:27 AM  CBC  WBC 4.0 - 10.5 K/uL 5.3  5.3  4.5   Hemoglobin 13.0 - 17.0 g/dL 13.0  86.5  78.4   Hematocrit 39.0 - 52.0  % 40.2  40.0  39.5   Platelets 150 - 400 K/uL 216  213  205       Latest Ref Rng & Units 01/05/2023    9:56 AM 10/06/2022   11:37 AM 07/04/2022    5:46 AM  CMP  Glucose 70 - 99 mg/dL 696  295  96   BUN 8 - 23 mg/dL 14  13  20    Creatinine 0.61 - 1.24 mg/dL 2.84  1.32  4.40   Sodium 135 - 145 mmol/L 130  132  133   Potassium 3.5 - 5.1 mmol/L 4.3  4.4  4.0   Chloride 98 - 111 mmol/L 98  98  102   CO2 22 - 32 mmol/L 23  24  25    Calcium 8.9 - 10.3 mg/dL 9.1  9.3  9.0   Total Protein 6.5 - 8.1 g/dL 7.4  7.3    Total Bilirubin 0.3 - 1.2 mg/dL 0.9  1.0    Alkaline Phos 38 - 126 U/L 55  59    AST 15 - 41 U/L 22  26    ALT 0 - 44 U/L 15  18       Iron/TIBC/Ferritin/ %Sat    Component Value Date/Time   IRON 154 01/05/2023 0956   TIBC 281 01/05/2023 0956   FERRITIN 162 01/05/2023 0956   IRONPCTSAT 55 (H) 01/05/2023 0956      RADIOGRAPHIC STUDIES: I have personally  reviewed the radiological images as listed and agreed with the findings in the report.  No results found.

## 2023-01-11 NOTE — Addendum Note (Signed)
Addended by: Rickard Patience on: 01/11/2023 07:15 PM   Modules accepted: Orders

## 2023-01-23 ENCOUNTER — Telehealth: Payer: Self-pay

## 2023-01-23 NOTE — Telephone Encounter (Signed)
Updated specimen tracking and history from MOHs progress notes. aw 

## 2023-01-24 ENCOUNTER — Encounter: Payer: Self-pay | Admitting: Dermatology

## 2023-01-24 ENCOUNTER — Ambulatory Visit: Payer: Medicare HMO | Admitting: Dermatology

## 2023-01-24 DIAGNOSIS — C4492 Squamous cell carcinoma of skin, unspecified: Secondary | ICD-10-CM

## 2023-01-24 DIAGNOSIS — D045 Carcinoma in situ of skin of trunk: Secondary | ICD-10-CM | POA: Diagnosis not present

## 2023-01-24 DIAGNOSIS — C44722 Squamous cell carcinoma of skin of right lower limb, including hip: Secondary | ICD-10-CM | POA: Diagnosis not present

## 2023-01-24 DIAGNOSIS — L570 Actinic keratosis: Secondary | ICD-10-CM

## 2023-01-24 DIAGNOSIS — S81801A Unspecified open wound, right lower leg, initial encounter: Secondary | ICD-10-CM

## 2023-01-24 DIAGNOSIS — T148XXA Other injury of unspecified body region, initial encounter: Secondary | ICD-10-CM

## 2023-01-24 DIAGNOSIS — D099 Carcinoma in situ, unspecified: Secondary | ICD-10-CM

## 2023-01-24 NOTE — Patient Instructions (Signed)
Wound Care Instructions  Cleanse wound gently with soap and water once a day then pat dry with clean gauze. Apply a thin coat of Petrolatum (petroleum jelly, "Vaseline") over the wound (unless you have an allergy to this). We recommend that you use a new, sterile tube of Vaseline. Do not pick or remove scabs. Do not remove the yellow or white "healing tissue" from the base of the wound.  Cover the wound with fresh, clean, nonstick gauze and secure with paper tape. You may use Band-Aids in place of gauze and tape if the wound is small enough, but would recommend trimming much of the tape off as there is often too much. Sometimes Band-Aids can irritate the skin.  You should call the office for your biopsy report after 1 week if you have not already been contacted.  If you experience any problems, such as abnormal amounts of bleeding, swelling, significant bruising, significant pain, or evidence of infection, please call the office immediately.  FOR ADULT SURGERY PATIENTS: If you need something for pain relief you may take 1 extra strength Tylenol (acetaminophen) AND 2 Ibuprofen (200mg each) together every 4 hours as needed for pain. (do not take these if you are allergic to them or if you have a reason you should not take them.) Typically, you may only need pain medication for 1 to 3 days.     Due to recent changes in healthcare laws, you may see results of your pathology and/or laboratory studies on MyChart before the doctors have had a chance to review them. We understand that in some cases there may be results that are confusing or concerning to you. Please understand that not all results are received at the same time and often the doctors may need to interpret multiple results in order to provide you with the best plan of care or course of treatment. Therefore, we ask that you please give us 2 business days to thoroughly review all your results before contacting the office for clarification. Should  we see a critical lab result, you will be contacted sooner.   If You Need Anything After Your Visit  If you have any questions or concerns for your doctor, please call our main line at [phone] and press option 4 to reach your doctor's medical assistant. If no one answers, please leave a voicemail as directed and we will return your call as soon as possible. Messages left after 4 pm will be answered the following business day.   You may also send us a message via MyChart. We typically respond to MyChart messages within 1-2 business days.  For prescription refills, please ask your pharmacy to contact our office. Our fax number is [phone].  If you have an urgent issue when the clinic is closed that cannot wait until the next business day, you can page your doctor at the number below.    Please note that while we do our best to be available for urgent issues outside of office hours, we are not available 24/7.   If you have an urgent issue and are unable to reach us, you may choose to seek medical care at your doctor's office, retail clinic, urgent care center, or emergency room.  If you have a medical emergency, please immediately call 911 or go to the emergency department.  Pager Numbers  - Dr. Kowalski: [phone]  - Dr. Moye: [phone]  - Dr. Stewart: [phone]  In the event of inclement weather, please call our main line at   an update on the status of any delays or closures.  Dermatology Medication Tips: Please keep the boxes that topical medications come in in order to help keep track of the instructions about where and how to use these. Pharmacies typically print the medication instructions only on the boxes and not directly on the medication tubes.   If your medication is too expensive, please contact our office at [phone] option 4 or send [place] a message through MyChart.   We are unable to tell what your co-pay for medications will be in advance  as this is different depending on your insurance coverage. However, we may be able to find a substitute medication at lower cost or fill out paperwork to get insurance to cover a needed medication.   If a prior authorization is required to get your medication covered by your insurance company, please allow [place] 1-2 business days to complete this process.  Drug prices often vary depending on where the prescription is filled and some pharmacies may offer cheaper prices.  The website www.goodrx.com contains coupons for medications through different pharmacies. The prices here do not account for what the cost may be with help from insurance (it may be cheaper with your insurance), but the website can give you the price if you did not use any insurance.  - You can print the associated coupon and take it with your prescription to the pharmacy.  - You may also stop by our office during regular business hours and pick up a GoodRx coupon card.  - If you need your prescription sent electronically to a different pharmacy, notify our office through [employer] or by phone at [phone] option 4.

## 2023-01-24 NOTE — Progress Notes (Signed)
Follow-Up Visit   Subjective  Andrew Walsh is a 82 y.o. male who presents for the following: EDC x 2 and AK follow up. AK's at right forearm, left dorsal hand and left 2nd finger treated with LN2 at last visit.   The following portions of the chart were reviewed this encounter and updated as appropriate: medications, allergies, medical history  Review of Systems:  No other skin or systemic complaints except as noted in HPI or Assessment and Plan.  Objective  Well appearing patient in no apparent distress; mood and affect are within normal limits.   A focused examination was performed of the following areas: Arms, legs, face, neck and back  Relevant exam findings are noted in the Assessment and Plan.  Left Mid Back Pink biopsy site  right distal pretibial Pink bx site  R post neck x 1, L dorsal hand x 1, L 2nd finger x 1 (3) Erythematous thin papules/macules with gritty scale.   right lateral calf Focally at center of incision site, healing well    Assessment & Plan     Squamous cell carcinoma in situ Left Mid Back  Destruction of lesion  Destruction method: electrodesiccation and curettage   Informed consent: discussed and consent obtained   Timeout:  patient name, date of birth, surgical site, and procedure verified Anesthesia: the lesion was anesthetized in a standard fashion   Anesthetic:  1% lidocaine w/ epinephrine 1-100,000 buffered w/ 8.4% NaHCO3 Curettage performed in three different directions: Yes   Electrodesiccation performed over the curetted area: Yes   Curettage cycles:  3 Final wound size (cm):  1.8 Hemostasis achieved with:  electrodesiccation Outcome: patient tolerated procedure well with no complications   Post-procedure details: sterile dressing applied and wound care instructions given   Dressing type: petrolatum    Squamous cell carcinoma of skin right distal pretibial  Destruction of lesion  Destruction method: electrodesiccation  and curettage   Informed consent: discussed and consent obtained   Timeout:  patient name, date of birth, surgical site, and procedure verified Anesthesia: the lesion was anesthetized in a standard fashion   Anesthetic:  1% lidocaine w/ epinephrine 1-100,000 buffered w/ 8.4% NaHCO3 Curettage performed in three different directions: Yes   Electrodesiccation performed over the curetted area: Yes   Curettage cycles:  3 Final wound size (cm):  2 Hemostasis achieved with:  electrodesiccation Outcome: patient tolerated procedure well with no complications   Post-procedure details: sterile dressing applied and wound care instructions given   Dressing type: petrolatum    AK (actinic keratosis) (3) R post neck x 1, L dorsal hand x 1, L 2nd finger x 1  Actinic keratoses are precancerous spots that appear secondary to cumulative UV radiation exposure/sun exposure over time. They are chronic with expected duration over 1 year. A portion of actinic keratoses will progress to squamous cell carcinoma of the skin. It is not possible to reliably predict which spots will progress to skin cancer and so treatment is recommended to prevent development of skin cancer.  Recommend daily broad spectrum sunscreen SPF 30+ to sun-exposed areas, reapply every 2 hours as needed.  Recommend staying in the shade or wearing long sleeves, sun glasses (UVA+UVB protection) and wide brim hats (4-inch brim around the entire circumference of the hat). Call for new or changing lesions.  Prior to procedure, discussed risks of blister formation, small wound, skin dyspigmentation, or rare scar following cryotherapy. Recommend Vaseline ointment to treated areas while healing.  Recheck right posterior  neck on follow up.   Destruction of lesion - R post neck x 1, L dorsal hand x 1, L 2nd finger x 1  Destruction method: cryotherapy   Informed consent: discussed and consent obtained   Lesion destroyed using liquid nitrogen: Yes    Cryotherapy cycles:  2 Outcome: patient tolerated procedure well with no complications   Post-procedure details: wound care instructions given    Open wound right lateral calf  Focal central erosion at excision site. Healing well.  Restart Vaseline and cover with bandage until healed all the way No signs of infection today.  Related Medications silver sulfADIAZINE (SILVADENE) 1 % cream Apply topically to wound at affected area daily along with vaseline and cover with bandage daily until healed.    Return for as scheduled.  Anise Salvo, RMA, am acting as scribe for Darden Dates, MD .   Documentation: I have reviewed the above documentation for accuracy and completeness, and I agree with the above.  Darden Dates, MD

## 2023-02-01 ENCOUNTER — Encounter: Payer: Self-pay | Admitting: Oncology

## 2023-02-08 ENCOUNTER — Other Ambulatory Visit: Payer: Medicare HMO

## 2023-02-09 DIAGNOSIS — Z0100 Encounter for examination of eyes and vision without abnormal findings: Secondary | ICD-10-CM | POA: Diagnosis not present

## 2023-02-09 DIAGNOSIS — H04123 Dry eye syndrome of bilateral lacrimal glands: Secondary | ICD-10-CM | POA: Diagnosis not present

## 2023-02-09 DIAGNOSIS — H43811 Vitreous degeneration, right eye: Secondary | ICD-10-CM | POA: Diagnosis not present

## 2023-02-09 DIAGNOSIS — H2513 Age-related nuclear cataract, bilateral: Secondary | ICD-10-CM | POA: Diagnosis not present

## 2023-02-12 ENCOUNTER — Inpatient Hospital Stay: Payer: Medicare HMO | Attending: Oncology

## 2023-02-12 DIAGNOSIS — Z8546 Personal history of malignant neoplasm of prostate: Secondary | ICD-10-CM | POA: Diagnosis not present

## 2023-02-12 MED ORDER — SODIUM CHLORIDE 0.9 % IV SOLN
Freq: Once | INTRAVENOUS | Status: AC
  Filled 2023-02-12: qty 250

## 2023-02-13 ENCOUNTER — Ambulatory Visit: Payer: Medicare HMO | Admitting: Oncology

## 2023-02-19 DIAGNOSIS — H2511 Age-related nuclear cataract, right eye: Secondary | ICD-10-CM | POA: Diagnosis not present

## 2023-02-19 DIAGNOSIS — H2513 Age-related nuclear cataract, bilateral: Secondary | ICD-10-CM | POA: Diagnosis not present

## 2023-02-19 DIAGNOSIS — H2512 Age-related nuclear cataract, left eye: Secondary | ICD-10-CM | POA: Diagnosis not present

## 2023-03-07 ENCOUNTER — Encounter: Payer: Self-pay | Admitting: Ophthalmology

## 2023-03-07 NOTE — Anesthesia Preprocedure Evaluation (Addendum)
Anesthesia Evaluation  Patient identified by MRN, date of birth, ID band Patient awake    Reviewed: Allergy & Precautions, H&P , NPO status , Patient's Chart, lab work & pertinent test results  Airway Mallampati: II  TM Distance: >3 FB Neck ROM: Full    Dental no notable dental hx.    Pulmonary former smoker   Pulmonary exam normal breath sounds clear to auscultation       Cardiovascular negative cardio ROS Normal cardiovascular exam Rhythm:Regular Rate:Normal     Neuro/Psych negative neurological ROS  negative psych ROS   GI/Hepatic negative GI ROS, Neg liver ROS,,,  Endo/Other  negative endocrine ROS    Renal/GU negative Renal ROS  negative genitourinary   Musculoskeletal negative musculoskeletal ROS (+) Arthritis ,    Abdominal   Peds negative pediatric ROS (+)  Hematology negative hematology ROS (+)   Anesthesia Other Findings Hereditary hemachromatosis  Homozygous hemochromatosis. Arthritis Uses walker History kidney stones Hyperlipidemia Prostate cancer Now anemic, likely from treatment for prostate cancer  Patient reports difficulty with balance, in last months, unknown etiology, using rolling walker.  Very alert, oriented x 3, delightful gentleman and his wife also.  Has not been seen by neuro nor by ENT. Encouraged him to speak with his primary care physician and see whether there may be efficacious treatment.    alcohol consumption, has been encouraged by his physician to decrease consumption of alcohol  Reproductive/Obstetrics negative OB ROS                              Anesthesia Physical Anesthesia Plan  ASA: 2  Anesthesia Plan: MAC   Post-op Pain Management:    Induction: Intravenous  PONV Risk Score and Plan:   Airway Management Planned: Natural Airway and Nasal Cannula  Additional Equipment:   Intra-op Plan:   Post-operative Plan:   Informed  Consent: I have reviewed the patients History and Physical, chart, labs and discussed the procedure including the risks, benefits and alternatives for the proposed anesthesia with the patient or authorized representative who has indicated his/her understanding and acceptance.     Dental Advisory Given  Plan Discussed with: Anesthesiologist, CRNA and Surgeon  Anesthesia Plan Comments: (Patient consented for risks of anesthesia including but not limited to:  - adverse reactions to medications - damage to eyes, teeth, lips or other oral mucosa - nerve damage due to positioning  - sore throat or hoarseness - Damage to heart, brain, nerves, lungs, other parts of body or loss of life  Patient voiced understanding.)         Anesthesia Quick Evaluation

## 2023-03-09 NOTE — Discharge Instructions (Signed)

## 2023-03-14 ENCOUNTER — Encounter: Payer: Self-pay | Admitting: Ophthalmology

## 2023-03-14 ENCOUNTER — Encounter
Admission: RE | Disposition: A | Discharge status: Home / Self Care | Payer: Self-pay | Source: Home / Self Care | Attending: Ophthalmology

## 2023-03-14 ENCOUNTER — Ambulatory Visit: Payer: Medicare HMO | Admitting: Anesthesiology

## 2023-03-14 ENCOUNTER — Other Ambulatory Visit: Payer: Self-pay

## 2023-03-14 ENCOUNTER — Ambulatory Visit
Admission: RE | Admit: 2023-03-14 | Discharge: 2023-03-14 | Disposition: A | Discharge status: Home / Self Care | Payer: Medicare HMO | Attending: Ophthalmology | Admitting: Ophthalmology

## 2023-03-14 DIAGNOSIS — E785 Hyperlipidemia, unspecified: Secondary | ICD-10-CM | POA: Diagnosis not present

## 2023-03-14 DIAGNOSIS — H5703 Miosis: Secondary | ICD-10-CM | POA: Diagnosis not present

## 2023-03-14 DIAGNOSIS — H2512 Age-related nuclear cataract, left eye: Secondary | ICD-10-CM | POA: Diagnosis not present

## 2023-03-14 DIAGNOSIS — M1990 Unspecified osteoarthritis, unspecified site: Secondary | ICD-10-CM | POA: Diagnosis not present

## 2023-03-14 DIAGNOSIS — Z87891 Personal history of nicotine dependence: Secondary | ICD-10-CM | POA: Diagnosis not present

## 2023-03-14 HISTORY — PX: CATARACT EXTRACTION W/PHACO: SHX586

## 2023-03-14 HISTORY — DX: Dependence on other enabling machines and devices: Z99.89

## 2023-03-14 SURGERY — PHACOEMULSIFICATION, CATARACT, WITH IOL INSERTION
Anesthesia: Monitor Anesthesia Care | Site: Eye | Laterality: Left

## 2023-03-14 MED ORDER — SIGHTPATH DOSE#1 BSS IO SOLN
INTRAOCULAR | Status: DC | PRN
  Administered 2023-03-14: 15 mL

## 2023-03-14 MED ORDER — SIGHTPATH DOSE#1 BSS IO SOLN
INTRAOCULAR | Status: DC | PRN
  Administered 2023-03-14: 1 mL via INTRAMUSCULAR

## 2023-03-14 MED ORDER — CEFUROXIME OPHTHALMIC INJECTION 1 MG/0.1 ML
INJECTION | OPHTHALMIC | Status: DC | PRN
  Administered 2023-03-14: .1 mL via INTRACAMERAL

## 2023-03-14 MED ORDER — SIGHTPATH DOSE#1 BSS IO SOLN
INTRAOCULAR | Status: DC | PRN
  Administered 2023-03-14: 73 mL via OPHTHALMIC

## 2023-03-14 MED ORDER — MIDAZOLAM HCL 2 MG/2ML IJ SOLN
INTRAMUSCULAR | Status: DC | PRN
  Administered 2023-03-14: 1 mg via INTRAVENOUS

## 2023-03-14 MED ORDER — LACTATED RINGERS IV SOLN: INTRAVENOUS | Status: DC

## 2023-03-14 MED ORDER — ARMC OPHTHALMIC DILATING DROPS
1.0000 | OPHTHALMIC | Status: DC | PRN
  Administered 2023-03-14 (×3): 1 via OPHTHALMIC

## 2023-03-14 MED ORDER — NEOMYCIN-POLYMYXIN-DEXAMETH 3.5-10000-0.1 OP OINT
TOPICAL_OINTMENT | OPHTHALMIC | Status: DC | PRN
  Administered 2023-03-14: 1 via OPHTHALMIC

## 2023-03-14 MED ORDER — SIGHTPATH DOSE#1 NA HYALUR & NA CHOND-NA HYALUR IO KIT
PACK | INTRAOCULAR | Status: DC | PRN
  Administered 2023-03-14: 1 via OPHTHALMIC

## 2023-03-14 MED ORDER — FENTANYL CITRATE (PF) 100 MCG/2ML IJ SOLN
INTRAMUSCULAR | Status: DC | PRN
  Administered 2023-03-14: 50 ug via INTRAVENOUS

## 2023-03-14 MED ORDER — TETRACAINE HCL 0.5 % OP SOLN
1.0000 [drp] | OPHTHALMIC | Status: DC | PRN
  Administered 2023-03-14 (×3): 1 [drp] via OPHTHALMIC

## 2023-03-14 MED ORDER — BRIMONIDINE TARTRATE-TIMOLOL 0.2-0.5 % OP SOLN
OPHTHALMIC | Status: DC | PRN
  Administered 2023-03-14: 1 [drp] via OPHTHALMIC

## 2023-03-14 MED ORDER — GLYCOPYRROLATE 0.2 MG/ML IJ SOLN
INTRAMUSCULAR | Status: DC | PRN
  Administered 2023-03-14: .2 mg via INTRAVENOUS

## 2023-03-14 SURGICAL SUPPLY — 11 items
CATARACT SUITE SIGHTPATH (MISCELLANEOUS) ×1 IMPLANT
FEE CATARACT SUITE SIGHTPATH (MISCELLANEOUS) ×1 IMPLANT
GLOVE SRG 8 PF TXTR STRL LF DI (GLOVE) ×1 IMPLANT
GLOVE SURG ENC TEXT LTX SZ7.5 (GLOVE) ×1 IMPLANT
GLOVE SURG UNDER POLY LF SZ8 (GLOVE) ×1
LENS CLARION VIVITY TORIC 21.0 ×1 IMPLANT
LENS IOL CLRN VT TRC 3 21.0 IMPLANT
NDL FILTER BLUNT 18X1 1/2 (NEEDLE) ×1 IMPLANT
NEEDLE FILTER BLUNT 18X1 1/2 (NEEDLE) ×1 IMPLANT
RING MALYGIN 7.0 (MISCELLANEOUS) IMPLANT
SYR 3ML LL SCALE MARK (SYRINGE) ×1 IMPLANT

## 2023-03-14 NOTE — Op Note (Signed)
  OPERATIVE NOTE  Andrew Walsh [id number] 03/14/2023  PREOPERATIVE DIAGNOSIS:   Nuclear sclerotic cataract left eye with miotic pupil      H25.12   POSTOPERATIVE DIAGNOSIS:   Nuclear sclerotic cataract left eye with miotic pupil.     PROCEDURE:  Phacoemulsification with posterior chamber intraocular lens implantation of the left eye which required pupil stretching with the Malyugin pupil expansion device  Ultrasound time: Procedure(s): CATARACT EXTRACTION PHACO AND INTRAOCULAR LENS PLACEMENT (IOC) LEFT MALYUGIN CLAREON VIVITY TORIC  15.21  01:17.4 (Left)  LENS:   Implant Name Type Inv. Item Serial No. Manufacturer Lot No. LRB No. Used Action  LENS CLARION VIVITY TORIC 21.0 - [id number]  LENS CLARION VIVITY TORIC 21.0 [id number] [employer]  Left 1 Implanted     [id number] 21.0 D Vivity Toric lens with 1.5 diopters of cylindrical power at axis 4 degrees.     SURGEON:  Deirdre Evener, MD   ANESTHESIA: Topical with tetracaine drops and 2% Xylocaine jelly, augmented with 1% preservative-free intracameral lidocaine.   COMPLICATIONS:  None.   DESCRIPTION OF PROCEDURE:  The patient was identified in the holding room and transported to the operating room and placed in the supine position under the operating microscope.  The left eye was identified as the operative eye and it was prepped and draped in the usual sterile ophthalmic fashion.   A 1 millimeter clear-corneal paracentesis was made at the 1:30 position.  The anterior chamber was filled with Viscoat viscoelastic.  0.5 ml of preservative-free 1% lidocaine was injected into the anterior chamber.  A 2.4 millimeter keratome was used to make a near-clear corneal incision at the 10:30 position.  A Malyugin pupil expander was then placed through the main incision and into the anterior chamber of the eye.  The edge of the iris was secured on the lip of the pupil expander and it was released, thereby expanding the pupil to approximately 7  millimeters for completion of the cataract surgery.  Additional Viscoat was placed in the anterior chamber.  A cystotome and capsulorrhexis forceps were used to make a curvilinear capsulorrhexis.   Balanced salt solution was used to hydrodissect and hydrodelineate the lens nucleus.   Phacoemulsification was used in stop and chop fashion to remove the lens, nucleus and epinucleus.  The remaining cortex was aspirated using the irrigation aspiration handpiece.  Additional Provisc was placed into the eye to distend the capsular bag for lens placement.  A lens was then injected into the capsular bag.  The pupil expanding ring was removed using a Kuglen hook and insertion device. The remaining viscoelastic was aspirated from the capsular bag and the anterior chamber. The lens was aligned to 4 degrees. The anterior chamber was filled with balanced salt solution to inflate to a physiologic pressure.   Wounds were hydrated with balanced salt solution.  The anterior chamber was inflated to a physiologic pressure with balanced salt solution.  No wound leaks were noted. Cefuroxime 0.1 ml of a 10mg /ml solution was injected into the anterior chamber for a dose of 1 mg of intracameral antibiotic at the completion of the case.   Timolol and Brimonidine drops and Maxitrol ointment were applied to the eye.  The patient was taken to the recovery room in stable condition without complications of anesthesia or surgery.  Andrew Walsh 03/14/2023, 12:41 PM

## 2023-03-14 NOTE — Anesthesia Postprocedure Evaluation (Signed)
Anesthesia Post Note  Patient: Andrew Walsh  Procedure(s) Performed: CATARACT EXTRACTION PHACO AND INTRAOCULAR LENS PLACEMENT (IOC) LEFT MALYUGIN CLAREON VIVITY TORIC  15.21  01:17.4 (Left: Eye)  Patient location during evaluation: PACU Anesthesia Type: MAC Level of consciousness: awake and alert Pain management: pain level controlled Vital Signs Assessment: post-procedure vital signs reviewed and stable Respiratory status: spontaneous breathing, nonlabored ventilation, respiratory function stable and patient connected to nasal cannula oxygen Cardiovascular status: stable and blood pressure returned to baseline Postop Assessment: no apparent nausea or vomiting Anesthetic complications: no   No notable events documented.   Last Vitals:  Vitals:   03/14/23 1241 03/14/23 1246  BP: (!) 137/91 131/88  Pulse: 62 (!) 58  Resp: 17 17  Temp: 36.7 C 36.7 C  SpO2: 98% 93%    Last Pain:  Vitals:   03/14/23 1246  TempSrc:   PainSc: 0-No pain                 Danaye Sobh C Ciani Rutten

## 2023-03-14 NOTE — H&P (Signed)
[facility]   Primary Care Physician:  Lynnea Ferrier, MD Ophthalmologist: Dr. Lockie Mola  Pre-Procedure History & Physical: HPI:  Andrew Walsh is a 82 y.o. male here for ophthalmic surgery.   Past Medical History:  Diagnosis Date   Actinic keratosis 01/31/2016   Left lateral neck post auricular. AK and prurigo nodularis   Actinic keratosis 01/31/2016   Left lateral neck post auricular, inferior. AK and prurigo nodularis   Actinic keratosis 12/19/2017   Left ear anti-helix   Ankle arthropathy 02/24/2014   Arthritis of foot, degenerative 10/03/2012   Overview:  Subtalar arthritis    Basal cell carcinoma 04/30/2008   Left mid dorsum nose.   Basal cell carcinoma 07/12/2009   Left post. deltoid sup. lat. edge of cancer scar.    Basal cell carcinoma 12/19/2022   Right frontal hair line. Nodular. Mohs 01/08/23   Cancer (HCC)    PROSTATE   Elevated PSA    Hereditary hemochromatosis (HCC) 11/03/2015   Overview:  Treated by regular blood donation    History of kidney stones    HLD (hyperlipidemia) 11/03/2015   Hyperlipidemia    Inguinal hernia    Localized, primary osteoarthritis of ankle or foot 02/24/2014   Osteoarthritis    SCC (squamous cell carcinoma) 12/05/2022   right lateral calf, excised 01/03/23   Squamous cell carcinoma in situ 12/19/2022   Left mid back. SCCis, hypertrophic and epidermat cyst. [facility] 01/24/23   Squamous cell carcinoma of skin 05/17/2010   Left forearm. KA-like pattern. ED[facility].   Squamous cell carcinoma of skin 12/18/2016   Right mid lat. volar forearm.WD SCC. [facility]   Squamous cell carcinoma of skin 01/22/2019   Right lateral distal tricep near elbow.    Squamous cell carcinoma of skin 07/14/2019   Right forearm. WD SCC. [facility]   Squamous cell carcinoma of skin 09/29/2019   Right proximal mandible. SCCis   Squamous cell carcinoma of skin 08/17/2020   Left ear sup helix   Squamous cell carcinoma of skin 12/19/2022   Right distal  pretibia. WD SCC with superficial infiltration and angiokeratoma. [facility] 01/24/23   Uses walker     Past Surgical History:  Procedure Laterality Date   ANKLE FUSION Right 2001, 2016   BONE MARROW ASPIRATION     COLONOSCOPY WITH PROPOFOL N/A 08/09/2020   Procedure: COLONOSCOPY WITH PROPOFOL;  Surgeon: Toledo, Boykin Nearing, MD;  Location: [facility];  Service: Gastroenterology;  Laterality: N/A;   FLEXIBLE SIGMOIDOSCOPY N/A 01/05/2021   Procedure: FLEXIBLE SIGMOIDOSCOPY;  Surgeon: Toledo, Boykin Nearing, MD;  Location: [facility];  Service: Gastroenterology;  Laterality: N/A;   FLEXIBLE SIGMOIDOSCOPY N/A 03/23/2021   Procedure: FLEXIBLE SIGMOIDOSCOPY;  Surgeon: Toledo, Boykin Nearing, MD;  Location: [facility];  Service: Gastroenterology;  Laterality: N/A;  CRYOTHERAPY PATIENT   FOOT ARTHRODESIS, SUBTALAR     HYDROCELE EXCISION / REPAIR     HYDROCELE EXCISION / REPAIR     JOINT REPLACEMENT Bilateral 02-2004/05-2010   KNEE ARTHROSCOPY     LUMBAR LAMINECTOMY/DECOMPRESSION MICRODISCECTOMY N/A 01/13/2022   Procedure: L3-S1 POSTERIOR SPIAL DECOMPRESSION;  Surgeon: Venetia Night, MD;  Location: [facility];  Service: Neurosurgery;  Laterality: N/A;   REVISION TOTAL HIP ARTHROPLASTY  2006   Right 2011, Left 2006    Prior to Admission medications   Medication Sig Start Date End Date Taking? Authorizing Provider  Ascorbic Acid (VITAMIN C) 1000 MG tablet Take 1,000 mg by mouth daily.   Yes [provider]  Calcium Carbonate-Vit D-Min (  CALCIUM 600+D3 PLUS MINERALS) 600-800 MG-UNIT TABS Take 2 tablets by mouth daily.   Yes [provider]  Cholecalciferol (VITAMIN D3) 25 MCG (1000 UT) CAPS Take by mouth daily.   Yes [provider]  Glucosamine HCl 1000 MG TABS Take 2,000 mg by mouth daily.   Yes [provider]  loperamide (IMODIUM) 2 MG capsule Take 4 mg by mouth daily.   Yes [provider]  mupirocin ointment (BACTROBAN) 2 % Apply once daily with bandage  change 01/02/23  Yes Moye, [place], MD  vitamin B-12 (CYANOCOBALAMIN) 500 MCG tablet Take 500 mcg by mouth daily.   Yes [provider]  silver sulfADIAZINE (SILVADENE) 1 % cream Apply topically to wound at affected area daily along with vaseline and cover with bandage daily until healed. 12/19/22   Moye, [place], MD    Allergies as of 02/13/2023   (No Known Allergies)    Family History  Problem Relation Age of Onset   Hemachromatosis Sister    Bladder Cancer Neg Hx    Prostate cancer Neg Hx    Kidney cancer Neg Hx     Social History   Socioeconomic History   Marital status: Married    Spouse name: Not on file   Number of children: Not on file   Years of education: Not on file   Highest education level: Not on file  Occupational History   Not on file  Tobacco Use   Smoking status: Former    Years: 20    Types: Cigarettes    Quit date: 09/10/1979    Years since quitting: 43.5   Smokeless tobacco: Never  Vaping Use   Vaping Use: Never used  Substance and Sexual Activity   Alcohol use: Yes    Alcohol/week: 32.0 standard drinks of alcohol    Types: 16 Glasses of wine, 16 Standard drinks or equivalent per week   Drug use: Never   Sexual activity: Not Currently  Other Topics Concern   Not on file  Social History Narrative   Live at home with wife.   Social Determinants of Health   Financial Resource Strain: Not on file  Food Insecurity: No Food Insecurity (06/27/2022)   Hunger Vital Sign    Worried About Running Out of Food in the Last Year: Never true    Ran Out of Food in the Last Year: Never true  Transportation Needs: No Transportation Needs (06/27/2022)   PRAPARE - Administrator, Civil Service (Medical): No    Lack of Transportation (Non-Medical): No  Physical Activity: Not on file  Stress: Not on file  Social Connections: Not on file  Intimate Partner Violence: Not At Risk (06/27/2022)   Humiliation, Afraid, Rape, and Kick  questionnaire    Fear of Current or Ex-Partner: No    Emotionally Abused: No    Physically Abused: No    Sexually Abused: No    Review of Systems: See HPI, otherwise negative ROS  Physical Exam: BP (!) 158/78   Temp 98.1 F (36.7 C) (Temporal)   Resp 16   Ht 5' 7.01" (1.702 m)   Wt 76 kg   SpO2 97%   BMI 26.23 kg/m  General:   Alert,  pleasant and cooperative in NAD Head:  Normocephalic and atraumatic. Lungs:  Clear to auscultation.    Heart:  Regular rate and rhythm.   Impression/Plan: Andrew Walsh is here for ophthalmic surgery.  Risks, benefits, limitations, and alternatives regarding ophthalmic surgery have  been reviewed with the patient.  Questions have been answered.  All parties agreeable.   Lockie Mola, MD  03/14/2023, 11:56 AM

## 2023-03-14 NOTE — Transfer of Care (Signed)
Immediate Anesthesia Transfer of Care Note  Patient: Andrew Walsh  Procedure(s) Performed: CATARACT EXTRACTION PHACO AND INTRAOCULAR LENS PLACEMENT (IOC) LEFT MALYUGIN CLAREON VIVITY TORIC  15.21  01:17.4 (Left: Eye)  Patient Location: PACU  Anesthesia Type: MAC  Level of Consciousness: awake, alert  and patient cooperative  Airway and Oxygen Therapy: Patient Spontanous Breathing and Patient connected to supplemental oxygen  Post-op Assessment: Post-op Vital signs reviewed, Patient's Cardiovascular Status Stable, Respiratory Function Stable, Patent Airway and No signs of Nausea or vomiting  Post-op Vital Signs: Reviewed and stable  Complications: No notable events documented.

## 2023-03-21 NOTE — Anesthesia Preprocedure Evaluation (Addendum)
Anesthesia Evaluation  Patient identified by MRN, date of birth, ID band Patient awake    Reviewed: Allergy & Precautions, H&P , NPO status , Patient's Chart, lab work & pertinent test results  Airway Mallampati: II  TM Distance: >3 FB Neck ROM: Full    Dental no notable dental hx.    Pulmonary former smoker   Pulmonary exam normal breath sounds clear to auscultation       Cardiovascular negative cardio ROS Normal cardiovascular exam Rhythm:Regular Rate:Normal     Neuro/Psych negative neurological ROS  negative psych ROS   GI/Hepatic negative GI ROS, Neg liver ROS,,,  Endo/Other  negative endocrine ROS    Renal/GU negative Renal ROS  negative genitourinary   Musculoskeletal negative musculoskeletal ROS (+) Arthritis ,    Abdominal   Peds negative pediatric ROS (+)  Hematology negative hematology ROS (+)   Anesthesia Other Findings   Previous cataract surgery 03-14-23   Arthritis Hereditary hemochromatosis BCC SCC  Reproductive/Obstetrics negative OB ROS                             Anesthesia Physical Anesthesia Plan  ASA: 2  Anesthesia Plan: MAC   Post-op Pain Management:    Induction: Intravenous  PONV Risk Score and Plan:   Airway Management Planned: Natural Airway and Nasal Cannula  Additional Equipment:   Intra-op Plan:   Post-operative Plan:   Informed Consent: I have reviewed the patients History and Physical, chart, labs and discussed the procedure including the risks, benefits and alternatives for the proposed anesthesia with the patient or authorized representative who has indicated his/her understanding and acceptance.     Dental Advisory Given  Plan Discussed with: Anesthesiologist, CRNA and Surgeon  Anesthesia Plan Comments: (Patient consented for risks of anesthesia including but not limited to:  - adverse reactions to medications - damage to  eyes, teeth, lips or other oral mucosa - nerve damage due to positioning  - sore throat or hoarseness - Damage to heart, brain, nerves, lungs, other parts of body or loss of life  Patient voiced understanding.)        Anesthesia Quick Evaluation

## 2023-03-22 NOTE — Discharge Instructions (Signed)

## 2023-03-26 ENCOUNTER — Ambulatory Visit: Payer: Medicare HMO | Admitting: Anesthesiology

## 2023-03-26 ENCOUNTER — Other Ambulatory Visit: Payer: Self-pay

## 2023-03-26 ENCOUNTER — Encounter
Admission: RE | Disposition: A | Discharge status: Home / Self Care | Payer: Self-pay | Source: Home / Self Care | Attending: Ophthalmology

## 2023-03-26 ENCOUNTER — Encounter: Payer: Self-pay | Admitting: Ophthalmology

## 2023-03-26 ENCOUNTER — Ambulatory Visit
Admission: RE | Admit: 2023-03-26 | Discharge: 2023-03-26 | Disposition: A | Discharge status: Home / Self Care | Payer: Medicare HMO | Attending: Ophthalmology | Admitting: Ophthalmology

## 2023-03-26 DIAGNOSIS — Z87891 Personal history of nicotine dependence: Secondary | ICD-10-CM | POA: Insufficient documentation

## 2023-03-26 DIAGNOSIS — Z01810 Encounter for preprocedural cardiovascular examination: Secondary | ICD-10-CM | POA: Diagnosis not present

## 2023-03-26 DIAGNOSIS — E785 Hyperlipidemia, unspecified: Secondary | ICD-10-CM | POA: Diagnosis not present

## 2023-03-26 DIAGNOSIS — R001 Bradycardia, unspecified: Secondary | ICD-10-CM | POA: Diagnosis not present

## 2023-03-26 DIAGNOSIS — H269 Unspecified cataract: Secondary | ICD-10-CM | POA: Diagnosis not present

## 2023-03-26 DIAGNOSIS — H2511 Age-related nuclear cataract, right eye: Secondary | ICD-10-CM | POA: Insufficient documentation

## 2023-03-26 HISTORY — PX: CATARACT EXTRACTION W/PHACO: SHX586

## 2023-03-26 SURGERY — PHACOEMULSIFICATION, CATARACT, WITH IOL INSERTION
Anesthesia: Monitor Anesthesia Care | Laterality: Right

## 2023-03-26 MED ORDER — GLYCOPYRROLATE 0.2 MG/ML IJ SOLN: 0.2000 mg | Freq: Once | INTRAMUSCULAR | Status: DC

## 2023-03-26 MED ORDER — LACTATED RINGERS IV SOLN: INTRAVENOUS | Status: DC

## 2023-03-26 MED ORDER — BRIMONIDINE TARTRATE-TIMOLOL 0.2-0.5 % OP SOLN
OPHTHALMIC | Status: DC | PRN
  Administered 2023-03-26: 1 [drp] via OPHTHALMIC

## 2023-03-26 MED ORDER — SIGHTPATH DOSE#1 BSS IO SOLN
INTRAOCULAR | Status: DC | PRN
  Administered 2023-03-26: 105 mL via OPHTHALMIC

## 2023-03-26 MED ORDER — CEFUROXIME OPHTHALMIC INJECTION 1 MG/0.1 ML
INJECTION | OPHTHALMIC | Status: DC | PRN
  Administered 2023-03-26: .1 mL via SUBCONJUNCTIVAL

## 2023-03-26 MED ORDER — ARMC OPHTHALMIC DILATING DROPS
1.0000 | OPHTHALMIC | Status: DC | PRN
  Administered 2023-03-26 (×3): 1 via OPHTHALMIC

## 2023-03-26 MED ORDER — SIGHTPATH DOSE#1 BSS IO SOLN
INTRAOCULAR | Status: DC | PRN
  Administered 2023-03-26: 15 mL

## 2023-03-26 MED ORDER — SIGHTPATH DOSE#1 BSS IO SOLN
INTRAOCULAR | Status: DC | PRN
  Administered 2023-03-26: 2 mL

## 2023-03-26 MED ORDER — MIDAZOLAM HCL 2 MG/2ML IJ SOLN
INTRAMUSCULAR | Status: DC | PRN
  Administered 2023-03-26: 1 mg via INTRAVENOUS

## 2023-03-26 MED ORDER — TETRACAINE HCL 0.5 % OP SOLN
1.0000 [drp] | OPHTHALMIC | Status: DC | PRN
  Administered 2023-03-26 (×3): 1 [drp] via OPHTHALMIC

## 2023-03-26 MED ORDER — SIGHTPATH DOSE#1 NA HYALUR & NA CHOND-NA HYALUR IO KIT
PACK | INTRAOCULAR | Status: DC | PRN
  Administered 2023-03-26: 1 via OPHTHALMIC

## 2023-03-26 MED ORDER — FENTANYL CITRATE (PF) 100 MCG/2ML IJ SOLN
INTRAMUSCULAR | Status: DC | PRN
  Administered 2023-03-26: 50 ug via INTRAVENOUS

## 2023-03-26 SURGICAL SUPPLY — 12 items
CATARACT SUITE SIGHTPATH (MISCELLANEOUS) ×1 IMPLANT
FEE CATARACT SUITE SIGHTPATH (MISCELLANEOUS) ×1 IMPLANT
GLOVE SRG 8 PF TXTR STRL LF DI (GLOVE) ×1 IMPLANT
GLOVE SURG ENC TEXT LTX SZ7.5 (GLOVE) ×1 IMPLANT
GLOVE SURG UNDER POLY LF SZ8 (GLOVE) ×1
LENS CLAREON VIVITY TORIC 18.5 ×1 IMPLANT
LENS CLRN VIVITY TORIC 3 18.5 ×1 IMPLANT
LENS IOL CLRN VT TRC 3 18.5 IMPLANT
NDL FILTER BLUNT 18X1 1/2 (NEEDLE) ×1 IMPLANT
NEEDLE FILTER BLUNT 18X1 1/2 (NEEDLE) ×1 IMPLANT
RING MALYGIN 7.0 (MISCELLANEOUS) IMPLANT
SYR 3ML LL SCALE MARK (SYRINGE) ×1 IMPLANT

## 2023-03-26 NOTE — H&P (Signed)
[facility]   Primary Care Physician:  Lynnea Ferrier, MD Ophthalmologist: Dr. Lockie Mola  Pre-Procedure History & Physical: HPI:  Andrew Walsh is a 82 y.o. male here for ophthalmic surgery.   Past Medical History:  Diagnosis Date   Actinic keratosis 01/31/2016   Left lateral neck post auricular. AK and prurigo nodularis   Actinic keratosis 01/31/2016   Left lateral neck post auricular, inferior. AK and prurigo nodularis   Actinic keratosis 12/19/2017   Left ear anti-helix   Ankle arthropathy 02/24/2014   Arthritis of foot, degenerative 10/03/2012   Overview:  Subtalar arthritis    Basal cell carcinoma 04/30/2008   Left mid dorsum nose.   Basal cell carcinoma 07/12/2009   Left post. deltoid sup. lat. edge of cancer scar.    Basal cell carcinoma 12/19/2022   Right frontal hair line. Nodular. Mohs 01/08/23   Cancer (HCC)    PROSTATE   Elevated PSA    Hereditary hemochromatosis (HCC) 11/03/2015   Overview:  Treated by regular blood donation    History of kidney stones    HLD (hyperlipidemia) 11/03/2015   Hyperlipidemia    Inguinal hernia    Localized, primary osteoarthritis of ankle or foot 02/24/2014   Osteoarthritis    SCC (squamous cell carcinoma) 12/05/2022   right lateral calf, excised 01/03/23   Squamous cell carcinoma in situ 12/19/2022   Left mid back. SCCis, hypertrophic and epidermat cyst. [facility] 01/24/23   Squamous cell carcinoma of skin 05/17/2010   Left forearm. KA-like pattern. EDC.   Squamous cell carcinoma of skin 12/18/2016   Right mid lat. volar forearm.WD SCC. ED[facility]   Squamous cell carcinoma of skin 01/22/2019   Right lateral distal tricep near elbow.    Squamous cell carcinoma of skin 07/14/2019   Right forearm. WD SCC. [facility]   Squamous cell carcinoma of skin 09/29/2019   Right proximal mandible. SCCis   Squamous cell carcinoma of skin 08/17/2020   Left ear sup helix   Squamous cell carcinoma of skin 12/19/2022   Right distal  pretibia. WD SCC with superficial infiltration and angiokeratoma. [facility] 01/24/23   Uses walker     Past Surgical History:  Procedure Laterality Date   ANKLE FUSION Right 2001, 2016   BONE MARROW ASPIRATION     CATARACT EXTRACTION W/PHACO Left 03/14/2023   Procedure: CATARACT EXTRACTION PHACO AND INTRAOCULAR LENS PLACEMENT (IOC) LEFT MALYUGIN CLAREON VIVITY TORIC  15.21  01:17.4;  Surgeon: Lockie Mola, MD;  Location: [facility];  Service: Ophthalmology;  Laterality: Left;   COLONOSCOPY WITH PROPOFOL N/A 08/09/2020   Procedure: COLONOSCOPY WITH PROPOFOL;  Surgeon: Toledo, Boykin Nearing, MD;  Location: [facility];  Service: Gastroenterology;  Laterality: N/A;   FLEXIBLE SIGMOIDOSCOPY N/A 01/05/2021   Procedure: FLEXIBLE SIGMOIDOSCOPY;  Surgeon: Toledo, Boykin Nearing, MD;  Location: [facility];  Service: Gastroenterology;  Laterality: N/A;   FLEXIBLE SIGMOIDOSCOPY N/A 03/23/2021   Procedure: FLEXIBLE SIGMOIDOSCOPY;  Surgeon: Toledo, Boykin Nearing, MD;  Location: [facility];  Service: Gastroenterology;  Laterality: N/A;  CRYOTHERAPY PATIENT   FOOT ARTHRODESIS, SUBTALAR     HYDROCELE EXCISION / REPAIR     HYDROCELE EXCISION / REPAIR     JOINT REPLACEMENT Bilateral 02-2004/05-2010   KNEE ARTHROSCOPY     LUMBAR LAMINECTOMY/DECOMPRESSION MICRODISCECTOMY N/A 01/13/2022   Procedure: L3-S1 POSTERIOR SPIAL DECOMPRESSION;  Surgeon: Venetia Night, MD;  Location: [facility];  Service: Neurosurgery;  Laterality: N/A;   REVISION TOTAL HIP ARTHROPLASTY  2006   Right 2011, Left  2006    Prior to Admission medications   Medication Sig Start Date End Date Taking? Authorizing Provider  Ascorbic Acid (VITAMIN C) 1000 MG tablet Take 1,000 mg by mouth daily.   Yes [provider]  Calcium Carbonate-Vit D-Min (CALCIUM 600+D3 PLUS MINERALS) 600-800 MG-UNIT TABS Take 2 tablets by mouth daily.   Yes [provider]  Cholecalciferol (VITAMIN D3) 25 MCG (1000 UT) CAPS Take by mouth daily.    Yes [provider]  Glucosamine HCl 1000 MG TABS Take 2,000 mg by mouth daily.   Yes [provider]  loperamide (IMODIUM) 2 MG capsule Take 4 mg by mouth daily.   Yes [provider]  mupirocin ointment (BACTROBAN) 2 % Apply once daily with bandage change 01/02/23  Yes Moye, [place], MD  silver sulfADIAZINE (SILVADENE) 1 % cream Apply topically to wound at affected area daily along with vaseline and cover with bandage daily until healed. 12/19/22  Yes Moye, [place], MD  vitamin B-12 (CYANOCOBALAMIN) 500 MCG tablet Take 500 mcg by mouth daily.    [provider]    Allergies as of 02/13/2023   (No Known Allergies)    Family History  Problem Relation Age of Onset   Hemachromatosis Sister    Bladder Cancer Neg Hx    Prostate cancer Neg Hx    Kidney cancer Neg Hx     Social History   Socioeconomic History   Marital status: Married    Spouse name: Not on file   Number of children: Not on file   Years of education: Not on file   Highest education level: Not on file  Occupational History   Not on file  Tobacco Use   Smoking status: Former    Current packs/day: 0.00    Types: Cigarettes    Start date: 09/10/1959    Quit date: 09/10/1979    Years since quitting: 43.5   Smokeless tobacco: Never  Vaping Use   Vaping status: Never Used  Substance and Sexual Activity   Alcohol use: Yes    Alcohol/week: 32.0 standard drinks of alcohol    Types: 16 Glasses of wine, 16 Standard drinks or equivalent per week   Drug use: Never   Sexual activity: Not Currently  Other Topics Concern   Not on file  Social History Narrative   Live at home with wife.   Social Determinants of Health   Financial Resource Strain: Not on file  Food Insecurity: No Food Insecurity (06/27/2022)   Hunger Vital Sign    Worried About Running Out of Food in the Last Year: Never true    Ran Out of Food in the Last Year: Never true  Transportation Needs: No Transportation  Needs (06/27/2022)   PRAPARE - Administrator, Civil Service (Medical): No    Lack of Transportation (Non-Medical): No  Physical Activity: Not on file  Stress: Not on file  Social Connections: Not on file  Intimate Partner Violence: Not At Risk (06/27/2022)   Humiliation, Afraid, Rape, and Kick questionnaire    Fear of Current or Ex-Partner: No    Emotionally Abused: No    Physically Abused: No    Sexually Abused: No    Review of Systems: See HPI, otherwise negative ROS  Physical Exam: BP (!) 176/81   Pulse (!) 50   Temp (!) 97.3 F (36.3 C) (Temporal)   Ht 5' 7.01" (1.702 m)   Wt 74.4 kg   SpO2 99%   BMI 25.68  kg/m  General:   Alert,  pleasant and cooperative in NAD Head:  Normocephalic and atraumatic. Lungs:  Clear to auscultation.    Heart:  Regular rate and rhythm.   Impression/Plan: Andrew Walsh is here for ophthalmic surgery.  Risks, benefits, limitations, and alternatives regarding ophthalmic surgery have been reviewed with the patient.  Questions have been answered.  All parties agreeable.   Lockie Mola, MD  03/26/2023, 7:33 AM

## 2023-03-26 NOTE — Anesthesia Postprocedure Evaluation (Signed)
Anesthesia Post Note  Patient: Andrew Walsh  Procedure(s) Performed: CATARACT EXTRACTION PHACO AND INTRAOCULAR LENS PLACEMENT (IOC) RIGHT MALYUGIN CLAREON VIVITY TORIC 14.34 01:11.2 (Right)  Patient location during evaluation: PACU Anesthesia Type: MAC Level of consciousness: awake and alert Pain management: pain level controlled Vital Signs Assessment: post-procedure vital signs reviewed and stable Respiratory status: spontaneous breathing, nonlabored ventilation, respiratory function stable and patient connected to nasal cannula oxygen Cardiovascular status: stable and blood pressure returned to baseline Postop Assessment: no apparent nausea or vomiting Anesthetic complications: no   No notable events documented.   Last Vitals:  Vitals:   03/26/23 0854 03/26/23 0857  BP:  129/83  Pulse: (!) 48 (!) 52  Resp: 11 (!) 21  Temp:    SpO2: 97% 96%    Last Pain:  Vitals:   03/26/23 0801  TempSrc:   PainSc: 0-No pain                 Ihor Meinzer C Lakaisha Danish

## 2023-03-26 NOTE — Op Note (Signed)
PREOPERATIVE DIAGNOSIS:  Nuclear sclerotic cataract of the right eye with miotic pupil.  H25.11   POSTOPERATIVE DIAGNOSIS:  Nuclear sclerotic cataract of the right eye with miotic pupil   PROCEDURE:  Phacoemulsification with Toric posterior chamber intraocular lens placement of the right eye with Malyugin ring to expand miotic pupil  Ultrasound time: Procedure(s): CATARACT EXTRACTION PHACO AND INTRAOCULAR LENS PLACEMENT (IOC) RIGHT MALYUGIN CLAREON VIVITY TORIC 14.34 01:11.2 (Right)  LENS:   Implant Name Type Inv. Item Serial No. Manufacturer Lot No. LRB No. Used Action  LENS CLAREON VIVITY TORIC 18.5 - [id number]  LENS CLAREON VIVITY TORIC 18.5 [id number] [employer]  Right 1 Implanted     [id number]  Toric intraocular lens with 1.5 diopters of cylindrical power with axis orientation at 174 degrees.   SURGEON:  Deirdre Evener, MD   ANESTHESIA: Topical with tetracaine drops and 2% Xylocaine jelly, augmented with 1% preservative-free intracameral lidocaine. .   COMPLICATIONS:  None.   DESCRIPTION OF PROCEDURE:  The patient was identified in the holding room and transported to the operating suite and placed in the supine position under the operating microscope.  The right eye was identified as the operative eye, and it was prepped and draped in the usual sterile ophthalmic fashion.    A clear-corneal paracentesis incision was made at the 12:00 position.  0.5 ml of preservative-free 1% lidocaine was injected into the anterior chamber. The anterior chamber was filled with Viscoat.  A 2.4 millimeter near clear corneal incision was then made at the 9:00 position. A Malyugin ring was placed to expand the pupil to 7mm. A cystotome and capsulorrhexis forceps were then used to make a curvilinear capsulorrhexis.  Hydrodissection and hydrodelineation were then performed using balanced salt solution.   Phacoemulsification was then used in stop and chop fashion to remove the lens, nucleus and  epinucleus.  The remaining cortex was aspirated using the irrigation and aspiration handpiece.  Provisc viscoelastic was then placed into the capsular bag to distend it for lens placement.  The Verion digital marker was used to align the implant at the intended axis.   A Toric lens was then injected into the capsular bag.  It was rotated clockwise until the axis marks on the lens were approximately 15 degrees in the counterclockwise direction to the intended alignment. The Malyugin ring was removed. The viscoelastic was aspirated from the eye using the irrigation aspiration handpiece.  Then, a Koch spatula through the sideport incision was used to rotate the lens in a clockwise direction until the axis markings of the intraocular lens were lined up with the Verion alignment.  Balanced salt solution was then used to hydrate the wounds. Cefuroxime 0.1 ml of a 10mg /ml solution was injected into the anterior chamber for a dose of 1 mg of intracameral antibiotic at the completion of the case.    The eye was noted to have a physiologic pressure and there was no wound leak noted.   Timolol and Brimonidine drops were applied to the eye.  The patient was taken to the recovery room in stable condition having had no complications of anesthesia or surgery.  Denyce Harr 03/26/2023, 8:01 AM

## 2023-03-26 NOTE — Transfer of Care (Signed)
Immediate Anesthesia Transfer of Care Note  Patient: Andrew Walsh  Procedure(s) Performed: CATARACT EXTRACTION PHACO AND INTRAOCULAR LENS PLACEMENT (IOC) RIGHT MALYUGIN CLAREON VIVITY TORIC 14.34 01:11.2 (Right)  Patient Location: PACU  Anesthesia Type: MAC  Level of Consciousness: awake, alert  and patient cooperative  Airway and Oxygen Therapy: Patient Spontanous Breathing and Patient connected to supplemental oxygen  Post-op Assessment: Post-op Vital signs reviewed, Patient's Cardiovascular Status Stable, Respiratory Function Stable, Patent Airway and No signs of Nausea or vomiting  Post-op Vital Signs: Reviewed and stable  Complications: No notable events documented.

## 2023-03-27 ENCOUNTER — Encounter: Payer: Self-pay | Admitting: Ophthalmology

## 2023-04-02 DIAGNOSIS — R739 Hyperglycemia, unspecified: Secondary | ICD-10-CM | POA: Diagnosis not present

## 2023-04-02 DIAGNOSIS — R269 Unspecified abnormalities of gait and mobility: Secondary | ICD-10-CM | POA: Diagnosis not present

## 2023-04-02 DIAGNOSIS — M48061 Spinal stenosis, lumbar region without neurogenic claudication: Secondary | ICD-10-CM | POA: Diagnosis not present

## 2023-04-02 DIAGNOSIS — E785 Hyperlipidemia, unspecified: Secondary | ICD-10-CM | POA: Diagnosis not present

## 2023-04-02 DIAGNOSIS — Z1331 Encounter for screening for depression: Secondary | ICD-10-CM | POA: Diagnosis not present

## 2023-04-02 DIAGNOSIS — Z0000 Encounter for general adult medical examination without abnormal findings: Secondary | ICD-10-CM | POA: Diagnosis not present

## 2023-05-11 ENCOUNTER — Inpatient Hospital Stay: Payer: Medicare HMO

## 2023-05-15 ENCOUNTER — Inpatient Hospital Stay: Payer: Medicare HMO | Admitting: Oncology

## 2023-05-15 ENCOUNTER — Inpatient Hospital Stay: Payer: Medicare HMO

## 2023-05-18 ENCOUNTER — Inpatient Hospital Stay: Payer: Medicare HMO | Attending: Oncology

## 2023-05-18 DIAGNOSIS — Z8546 Personal history of malignant neoplasm of prostate: Secondary | ICD-10-CM | POA: Insufficient documentation

## 2023-05-18 DIAGNOSIS — D7589 Other specified diseases of blood and blood-forming organs: Secondary | ICD-10-CM | POA: Insufficient documentation

## 2023-05-18 LAB — CMP (CANCER CENTER ONLY)
ALT: 18 U/L (ref 0–44)
AST: 24 U/L (ref 15–41)
Albumin: 4.2 g/dL (ref 3.5–5.0)
Alkaline Phosphatase: 59 U/L (ref 38–126)
Anion gap: 8 (ref 5–15)
BUN: 13 mg/dL (ref 8–23)
CO2: 24 mmol/L (ref 22–32)
Calcium: 9.4 mg/dL (ref 8.9–10.3)
Chloride: 100 mmol/L (ref 98–111)
Creatinine: 0.87 mg/dL (ref 0.61–1.24)
GFR, Estimated: >60 mL/min (ref >60)
Glucose, Bld: 105 mg/dL — ABNORMAL HIGH (ref 70–99)
Potassium: 4.6 mmol/L (ref 3.5–5.1)
Sodium: 132 mmol/L — ABNORMAL LOW (ref 135–145)
Total Bilirubin: 0.9 mg/dL (ref 0.3–1.2)
Total Protein: 7 g/dL (ref 6.5–8.1)

## 2023-05-18 LAB — CBC WITH DIFFERENTIAL (CANCER CENTER ONLY)
Abs Immature Granulocytes: 0.03 10*3/uL (ref 0.00–0.07)
Basophils Absolute: 0 10*3/uL (ref 0.0–0.1)
Basophils Relative: 1 %
Eosinophils Absolute: 0.2 10*3/uL (ref 0.0–0.5)
Eosinophils Relative: 3 %
HCT: 40 % (ref 39.0–52.0)
Hemoglobin: 13.8 g/dL (ref 13.0–17.0)
Immature Granulocytes: 1 %
Lymphocytes Relative: 20 %
Lymphs Abs: 0.9 10*3/uL (ref 0.7–4.0)
MCH: 35.8 pg — ABNORMAL HIGH (ref 26.0–34.0)
MCHC: 34.5 g/dL (ref 30.0–36.0)
MCV: 103.6 fL — ABNORMAL HIGH (ref 80.0–100.0)
Monocytes Absolute: 0.6 10*3/uL (ref 0.1–1.0)
Monocytes Relative: 14 %
Neutro Abs: 2.7 10*3/uL (ref 1.7–7.7)
Neutrophils Relative %: 61 %
Platelet Count: 229 10*3/uL (ref 150–400)
RBC: 3.86 MIL/uL — ABNORMAL LOW (ref 4.22–5.81)
RDW: 12.5 % (ref 11.5–15.5)
WBC Count: 4.4 10*3/uL (ref 4.0–10.5)
nRBC: 0 % (ref 0.0–0.2)

## 2023-05-18 LAB — FERRITIN: Ferritin: 73 ng/mL (ref 24–336)

## 2023-05-18 LAB — IRON AND TIBC
Iron: 142 ug/dL (ref 45–182)
Saturation Ratios: 45 % — ABNORMAL HIGH (ref 17.9–39.5)
TIBC: 318 ug/dL (ref 250–450)
UIBC: 176 ug/dL

## 2023-05-22 ENCOUNTER — Inpatient Hospital Stay: Payer: Medicare HMO

## 2023-05-22 ENCOUNTER — Inpatient Hospital Stay: Payer: Medicare HMO | Admitting: Oncology

## 2023-05-22 ENCOUNTER — Encounter: Payer: Self-pay | Admitting: Oncology

## 2023-05-22 DIAGNOSIS — Z789 Other specified health status: Secondary | ICD-10-CM | POA: Diagnosis not present

## 2023-05-22 DIAGNOSIS — Z8546 Personal history of malignant neoplasm of prostate: Secondary | ICD-10-CM | POA: Diagnosis not present

## 2023-05-22 DIAGNOSIS — D7589 Other specified diseases of blood and blood-forming organs: Secondary | ICD-10-CM | POA: Diagnosis not present

## 2023-05-22 NOTE — Assessment & Plan Note (Signed)
Last ultrasound in 2022 showed no signs of liver cirrhosis. Encourage alcohol cessation effort

## 2023-05-22 NOTE — Assessment & Plan Note (Addendum)
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  Other differential diagnosis includes bone marrow disorders, B12 deficiency.   Observation for now. He has no cytopenia.  Check B12, myeloma panel, light chain ratio at next visit.

## 2023-05-22 NOTE — Progress Notes (Signed)
Hematology/Oncology Progress note [phone] [phone]     CHIEF COMPLAINTS/REASON FOR VISIT:  hemochromatosis management   ASSESSMENT & PLAN:   Hereditary hemochromatosis (HCC) #History of hereditary hemochromatosis.  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 We reviewed the goal of ferritin to be less than 100, iron saturation less than 45%. Lab Results  Component Value Date   IRON 142 05/18/2023   TIBC 318 05/18/2023   IRONPCTSAT 45 (H) 05/18/2023   FERRITIN 73 05/18/2023    Will hold off phlebotomy. Continue monitor.   Alcohol use Last ultrasound in 2022 showed no signs of liver cirrhosis. Encourage alcohol cessation effort  Macrocytosis without anemia Possibly secondary to alcohol effect on bone marrow.  Other differential diagnosis includes bone marrow disorders, B12 deficiency.   Observation for now. He has no cytopenia.  Check B12, myeloma panel, light chain ratio at next visit.    Orders Placed This Encounter  Procedures   CBC with Differential (Cancer Center Only)    Standing Status:   Future    Standing Expiration Date:   05/21/2024   Hepatic function panel    Standing Status:   Future    Standing Expiration Date:   05/21/2024   Iron and TIBC    Standing Status:   Future    Standing Expiration Date:   05/21/2024   Ferritin    Standing Status:   Future    Standing Expiration Date:   05/21/2024   Vitamin B12    Standing Status:   Future    Standing Expiration Date:   05/21/2024   Kappa/lambda light chains    Standing Status:   Future    Standing Expiration Date:   05/21/2024   Multiple Myeloma Panel (SPEP&IFE w/QIG)    Standing Status:   Future    Standing Expiration Date:   05/21/2024   Follow-up Labs in 3 months prior to MD  All questions were answered. The patient knows to call the clinic with any problems, questions or concerns.  Rickard Patience, MD, PhD [facility] Hematology Oncology 05/22/2023      HISTORY  OF PRESENTING ILLNESS:  QUENT LINGENFELTER is a 82 y.o. male presents for homozygous hemochromatosis management.  Patient reports a history  hereditary hemochromatosis.  He was diagnosed in 32s by his previous primary care provider in [place] and he has been an active blood donor every 8 to 10 weeks for many years.  Recently he was diagnosed with prostate cancer Gleason score 4+3 with a PSA of 9.4 and underwent IMRT radiation by Dr. Rushie Chestnut and he finished in September 2020. He is mildly anemic and also cannot further donate blood due to recent radiation and was referred to me for management of hemochromatosis and iron overload.  Fatigue: denies He had blood work done at [facility] primary care provider's office recently. 08/19/2019, hemoglobin 14, hematocrit 40.7, MCV 105.7. Iron 254, ferritin 514,  07/18/2019, iron 259, ferritin 476, transferrin 193.5, TIBC 270, iron saturation 96  Patient was seen by gastroenterology Dr. Norma Fredrickson on 10/07/2020, his hematochezia was considered to be secondary to radiation-induced proctitis with telangiectasia.  Patient had a colonoscopy with APC on 08/09/2020. Patient was recommended to use Imodium.  As patient continues to have symptoms, he has discussed with Dr. Horace Porteous team in early April 2022 and was advised to use Carafate enema twice daily,  dermatology for cutaneous squamous cell carcinoma,  Status post resection  INTERVAL HISTORY GARRISON TERRAL is a 82 y.o. male  who has above history reviewed by me today presents for follow up visit for management of iron overload/hemochromatosis. Patient reports cutting down on alcohol consumption.   Patient reports feeling symptoms of anemia " weakness, decreased exercise induration".   Review of Systems  Constitutional:  Positive for fatigue. Negative for appetite change, chills, fever and unexpected weight change.  HENT:   Negative for hearing loss and voice change.   Eyes:  Negative for eye problems and  icterus.  Respiratory:  Negative for chest tightness, cough and shortness of breath.   Cardiovascular:  Negative for chest pain and leg swelling.  Gastrointestinal:  Negative for abdominal distention, abdominal pain, blood in stool and diarrhea.  Endocrine: Negative for hot flashes.  Genitourinary:  Negative for difficulty urinating, dysuria and frequency.   Musculoskeletal:  Negative for arthralgias.  Skin:  Negative for itching and rash.  Neurological:  Negative for light-headedness and numbness.  Hematological:  Negative for adenopathy. Does not bruise/bleed easily.  Psychiatric/Behavioral:  Negative for confusion.     MEDICAL HISTORY:  Past Medical History:  Diagnosis Date   Actinic keratosis 01/31/2016   Left lateral neck post auricular. AK and prurigo nodularis   Actinic keratosis 01/31/2016   Left lateral neck post auricular, inferior. AK and prurigo nodularis   Actinic keratosis 12/19/2017   Left ear anti-helix   Ankle arthropathy 02/24/2014   Arthritis of foot, degenerative 10/03/2012   Overview:  Subtalar arthritis    Basal cell carcinoma 04/30/2008   Left mid dorsum nose.   Basal cell carcinoma 07/12/2009   Left post. deltoid sup. lat. edge of cancer scar.    Basal cell carcinoma 12/19/2022   Right frontal hair line. Nodular. Mohs 01/08/23   Cancer (HCC)    PROSTATE   Elevated PSA    Hereditary hemochromatosis (HCC) 11/03/2015   Overview:  Treated by regular blood donation    History of kidney stones    HLD (hyperlipidemia) 11/03/2015   Hyperlipidemia    Inguinal hernia    Localized, primary osteoarthritis of ankle or foot 02/24/2014   Osteoarthritis    SCC (squamous cell carcinoma) 12/05/2022   right lateral calf, excised 01/03/23   Squamous cell carcinoma in situ 12/19/2022   Left mid back. SCCis, hypertrophic and epidermat cyst. [facility] 01/24/23   Squamous cell carcinoma of skin 05/17/2010   Left forearm. KA-like pattern. ED[facility].   Squamous cell carcinoma of skin  12/18/2016   Right mid lat. volar forearm.WD SCC. [facility]   Squamous cell carcinoma of skin 01/22/2019   Right lateral distal tricep near elbow.    Squamous cell carcinoma of skin 07/14/2019   Right forearm. WD SCC. [facility]   Squamous cell carcinoma of skin 09/29/2019   Right proximal mandible. SCCis   Squamous cell carcinoma of skin 08/17/2020   Left ear sup helix   Squamous cell carcinoma of skin 12/19/2022   Right distal pretibia. WD SCC with superficial infiltration and angiokeratoma. [facility] 01/24/23   Uses walker     SURGICAL HISTORY: Past Surgical History:  Procedure Laterality Date   ANKLE FUSION Right 2001, 2016   BONE MARROW ASPIRATION     CATARACT EXTRACTION W/PHACO Left 03/14/2023   Procedure: CATARACT EXTRACTION PHACO AND INTRAOCULAR LENS PLACEMENT (IOC) LEFT MALYUGIN CLAREON VIVITY TORIC  15.21  01:17.4;  Surgeon: Lockie Mola, MD;  Location: [facility];  Service: Ophthalmology;  Laterality: Left;   CATARACT EXTRACTION W/PHACO Right 03/26/2023   Procedure: CATARACT EXTRACTION PHACO AND INTRAOCULAR LENS PLACEMENT (IOC) RIGHT MALYUGIN  CLAREON VIVITY TORIC 14.34 01:11.2;  Surgeon: Lockie Mola, MD;  Location: [facility];  Service: Ophthalmology;  Laterality: Right;   COLONOSCOPY WITH PROPOFOL N/A 08/09/2020   Procedure: COLONOSCOPY WITH PROPOFOL;  Surgeon: Toledo, Boykin Nearing, MD;  Location: [facility];  Service: Gastroenterology;  Laterality: N/A;   FLEXIBLE SIGMOIDOSCOPY N/A 01/05/2021   Procedure: FLEXIBLE SIGMOIDOSCOPY;  Surgeon: Toledo, Boykin Nearing, MD;  Location: [facility];  Service: Gastroenterology;  Laterality: N/A;   FLEXIBLE SIGMOIDOSCOPY N/A 03/23/2021   Procedure: FLEXIBLE SIGMOIDOSCOPY;  Surgeon: Toledo, Boykin Nearing, MD;  Location: [facility];  Service: Gastroenterology;  Laterality: N/A;  CRYOTHERAPY PATIENT   FOOT ARTHRODESIS, SUBTALAR     HYDROCELE EXCISION / REPAIR     HYDROCELE EXCISION / REPAIR     JOINT REPLACEMENT Bilateral  02-2004/05-2010   KNEE ARTHROSCOPY     LUMBAR LAMINECTOMY/DECOMPRESSION MICRODISCECTOMY N/A 01/13/2022   Procedure: L3-S1 POSTERIOR SPIAL DECOMPRESSION;  Surgeon: Venetia Night, MD;  Location: [facility];  Service: Neurosurgery;  Laterality: N/A;   REVISION TOTAL HIP ARTHROPLASTY  2006   Right 2011, Left 2006    SOCIAL HISTORY: Social History   Socioeconomic History   Marital status: Married    Spouse name: Not on file   Number of children: Not on file   Years of education: Not on file   Highest education level: Not on file  Occupational History   Not on file  Tobacco Use   Smoking status: Former    Current packs/day: 0.00    Types: Cigarettes    Start date: 09/10/1959    Quit date: 09/10/1979    Years since quitting: 43.7   Smokeless tobacco: Never  Vaping Use   Vaping status: Never Used  Substance and Sexual Activity   Alcohol use: Yes    Alcohol/week: 32.0 standard drinks of alcohol    Types: 16 Glasses of wine, 16 Standard drinks or equivalent per week   Drug use: Never   Sexual activity: Not Currently  Other Topics Concern   Not on file  Social History Narrative   Live at home with wife.   Social Determinants of Health   Financial Resource Strain: Low Risk  (04/02/2023)   Received from [facility]   Overall Financial Resource Strain (CARDIA)    Difficulty of Paying Living Expenses: Not hard at all  Food Insecurity: No Food Insecurity (04/02/2023)   [facility] from [facility]   Hunger Vital Sign    Worried About Running Out of Food in the Last Year: Never true    Ran Out of Food in the Last Year: Never true  Transportation Needs: No Transportation Needs (04/02/2023)   Received from [facility] - Transportation    In the past 12 months, has lack of transportation kept you from medical appointments or from getting medications?: No    Lack of Transportation (Non-Medical): No  Physical Activity: Not on  file  Stress: Not on file  Social Connections: Not on file  Intimate Partner Violence: Not At Risk (06/27/2022)   Humiliation, Afraid, Rape, and Kick questionnaire    Fear of Current or Ex-Partner: No    Emotionally Abused: No    Physically Abused: No    Sexually Abused: No    FAMILY HISTORY: Family History  Problem Relation Age of Onset   Hemachromatosis Sister    Bladder Cancer Neg Hx    Prostate cancer Neg Hx    Kidney cancer Neg Hx  ALLERGIES:  has No Known Allergies.  MEDICATIONS:  Current Outpatient Medications  Medication Sig Dispense Refill   Ascorbic Acid (VITAMIN C) 1000 MG tablet Take 1,000 mg by mouth daily.     Calcium Carbonate-Vit D-Min (CALCIUM 600+D3 PLUS MINERALS) 600-800 MG-UNIT TABS Take 2 tablets by mouth daily.     Cholecalciferol (VITAMIN D3) 25 MCG (1000 UT) CAPS Take by mouth daily.     Glucosamine HCl 1000 MG TABS Take 2,000 mg by mouth daily.     loperamide (IMODIUM) 2 MG capsule Take 4 mg by mouth daily.     silver sulfADIAZINE (SILVADENE) 1 % cream Apply topically to wound at affected area daily along with vaseline and cover with bandage daily until healed. 50 g 0   vitamin B-12 (CYANOCOBALAMIN) 500 MCG tablet Take 500 mcg by mouth daily.     No current facility-administered medications for this visit.     PHYSICAL EXAMINATION: ECOG PERFORMANCE STATUS: 1 - Symptomatic but completely ambulatory Vitals:   05/22/23 1333  BP: (!) 155/87  Pulse: 82  Resp: 18  Temp: 98.4 F (36.9 C)  SpO2: (!) 87%   Filed Weights   05/22/23 1333  Weight: 165 lb (74.8 kg)    Physical Exam Constitutional:      General: He is not in acute distress.    Comments: He walks with a walker  HENT:     Head: Normocephalic.  Eyes:     General: No scleral icterus. Cardiovascular:     Rate and Rhythm: Normal rate.  Pulmonary:     Effort: Pulmonary effort is normal. No respiratory distress.     Breath sounds: No wheezing.  Abdominal:     General: Bowel  sounds are normal. There is no distension.     Palpations: Abdomen is soft.  Musculoskeletal:        General: No deformity. Normal range of motion.     Cervical back: Normal range of motion and neck supple.  Skin:    General: Skin is warm and dry.     Findings: No rash.  Neurological:     Mental Status: He is alert and oriented to person, place, and time. Mental status is at baseline.     Cranial Nerves: No cranial nerve deficit.  Psychiatric:        Mood and Affect: Mood normal.      LABORATORY DATA:  I have reviewed the data as listed    Latest Ref Rng & Units 05/18/2023   11:50 AM 01/05/2023    9:56 AM 10/06/2022   11:37 AM  CBC  WBC 4.0 - 10.5 K/uL 4.4  5.3  5.3   Hemoglobin 13.0 - 17.0 g/dL 16.1  09.6  04.5   Hematocrit 39.0 - 52.0 % 40.0  40.2  40.0   Platelets 150 - 400 K/uL 229  216  213       Latest Ref Rng & Units 05/18/2023   11:50 AM 01/05/2023    9:56 AM 10/06/2022   11:37 AM  CMP  Glucose 70 - 99 mg/dL 409  811  914   BUN 8 - 23 mg/dL 13  14  13    Creatinine 0.61 - 1.24 mg/dL 7.82  9.56  2.13   Sodium 135 - 145 mmol/L 132  130  132   Potassium 3.5 - 5.1 mmol/L 4.6  4.3  4.4   Chloride 98 - 111 mmol/L 100  98  98   CO2 22 - 32 mmol/L 24  23  24   Calcium 8.9 - 10.3 mg/dL 9.4  9.1  9.3   Total Protein 6.5 - 8.1 g/dL 7.0  7.4  7.3   Total Bilirubin 0.3 - 1.2 mg/dL 0.9  0.9  1.0   Alkaline Phos 38 - 126 U/L 59  55  59   AST 15 - 41 U/L 24  22  26    ALT 0 - 44 U/L 18  15  18       Iron/TIBC/Ferritin/ %Sat    Component Value Date/Time   IRON 142 05/18/2023 1150   TIBC 318 05/18/2023 1150   FERRITIN 73 05/18/2023 1150   IRONPCTSAT 45 (H) 05/18/2023 1150      RADIOGRAPHIC STUDIES: I have personally reviewed the radiological images as listed and agreed with the findings in the report.  No results found.

## 2023-05-22 NOTE — Assessment & Plan Note (Signed)
#  History of hereditary hemochromatosis.  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 We reviewed the goal of ferritin to be less than 100, iron saturation less than 45%. Lab Results  Component Value Date   IRON 142 05/18/2023   TIBC 318 05/18/2023   IRONPCTSAT 45 (H) 05/18/2023   FERRITIN 73 05/18/2023    Will hold off phlebotomy. Continue monitor.

## 2023-06-21 ENCOUNTER — Ambulatory Visit: Payer: Medicare HMO | Admitting: Dermatology

## 2023-06-25 ENCOUNTER — Ambulatory Visit: Payer: Medicare HMO | Admitting: Dermatology

## 2023-06-25 DIAGNOSIS — L821 Other seborrheic keratosis: Secondary | ICD-10-CM | POA: Diagnosis not present

## 2023-06-25 DIAGNOSIS — Z5111 Encounter for antineoplastic chemotherapy: Secondary | ICD-10-CM

## 2023-06-25 DIAGNOSIS — L2389 Allergic contact dermatitis due to other agents: Secondary | ICD-10-CM

## 2023-06-25 DIAGNOSIS — L814 Other melanin hyperpigmentation: Secondary | ICD-10-CM

## 2023-06-25 DIAGNOSIS — W908XXA Exposure to other nonionizing radiation, initial encounter: Secondary | ICD-10-CM | POA: Diagnosis not present

## 2023-06-25 DIAGNOSIS — Z7189 Other specified counseling: Secondary | ICD-10-CM

## 2023-06-25 DIAGNOSIS — Z1283 Encounter for screening for malignant neoplasm of skin: Secondary | ICD-10-CM

## 2023-06-25 DIAGNOSIS — Z79899 Other long term (current) drug therapy: Secondary | ICD-10-CM

## 2023-06-25 DIAGNOSIS — L578 Other skin changes due to chronic exposure to nonionizing radiation: Secondary | ICD-10-CM

## 2023-06-25 DIAGNOSIS — L853 Xerosis cutis: Secondary | ICD-10-CM | POA: Diagnosis not present

## 2023-06-25 DIAGNOSIS — L905 Scar conditions and fibrosis of skin: Secondary | ICD-10-CM

## 2023-06-25 DIAGNOSIS — Z85828 Personal history of other malignant neoplasm of skin: Secondary | ICD-10-CM

## 2023-06-25 DIAGNOSIS — L570 Actinic keratosis: Secondary | ICD-10-CM | POA: Diagnosis not present

## 2023-06-25 DIAGNOSIS — D229 Melanocytic nevi, unspecified: Secondary | ICD-10-CM

## 2023-06-25 DIAGNOSIS — D1801 Hemangioma of skin and subcutaneous tissue: Secondary | ICD-10-CM

## 2023-06-25 DIAGNOSIS — Z8589 Personal history of malignant neoplasm of other organs and systems: Secondary | ICD-10-CM

## 2023-06-25 MED ORDER — MOMETASONE FUROATE 0.1 % EX CREA: TOPICAL_CREAM | CUTANEOUS | 0 refills | Status: DC

## 2023-06-25 NOTE — Patient Instructions (Addendum)
Gentle Skin Care Guide  1. Bathe no more than once a day.  2. Avoid bathing in hot water  3. Use a mild soap like CeraVe. Can use Lever 2000 or Cetaphil antibacterial soap  4. Use soap only where you need it. On most days, use it under your arms, between your legs, and on your feet. Let the water rinse other areas unless visibly dirty.  5. When you get out of the bath/shower, use a towel to gently blot your skin dry, don't rub it.  6. While your skin is still a little damp, apply a moisturizing cream such as Vanicream, CeraVe, Cetaphil, Eucerin, Sarna lotion or plain Vaseline Jelly. For hands apply Neutrogena [place] Hand Cream or Excipial Hand Cream.  7. Reapply moisturizer any time you start to itch or feel dry.  8. Sometimes using free and clear laundry detergents can be helpful. Fabric softener sheets should be avoided. Downy Free & Gentle liquid, or any liquid fabric softener that is free of dyes and perfumes, it acceptable to use  9. If your doctor has given you prescription creams you may apply moisturizers over them    Due to recent changes in healthcare laws, you may see results of your pathology and/or laboratory studies on MyChart before the doctors have had a chance to review them. We understand that in some cases there may be results that are confusing or concerning to you. Please understand that not all results are received at the same time and often the doctors may need to interpret multiple results in order to provide you with the best plan of care or course of treatment. Therefore, we ask that you please give [place] 2 business days to thoroughly review all your results before contacting the office for clarification. Should we see a critical lab result, you will be contacted sooner.   If You Need Anything After Your Visit  If you have any questions or concerns for your doctor, please call our main line at [phone] and press option 4 to reach your doctor's medical  assistant. If no one answers, please leave a voicemail as directed and we will return your call as soon as possible. Messages left after 4 pm will be answered the following business day.   You may also send [place] a message via MyChart. We typically respond to MyChart messages within 1-2 business days.  For prescription refills, please ask your pharmacy to contact our office. Our fax number is [phone].  If you have an urgent issue when the clinic is closed that cannot wait until the next business day, you can page your doctor at the number below.    Please note that while we do our best to be available for urgent issues outside of office hours, we are not available 24/7.   If you have an urgent issue and are unable to reach [place], you may choose to seek medical care at your doctor's office, retail clinic, urgent care center, or emergency room.  If you have a medical emergency, please immediately call 911 or go to the emergency department.  Pager Numbers  - Dr. Gwen Pounds: [phone]  - Dr. Roseanne Reno: [phone]  - Dr. Katrinka Blazing: [phone]   In the event of inclement weather, please call our main line at [phone] for an update on the status of any delays or closures.  Dermatology Medication Tips: Please keep the boxes that topical medications come in in order to help keep track of the instructions about where and how to  use these. Pharmacies typically print the medication instructions only on the boxes and not directly on the medication tubes.   If your medication is too expensive, please contact our office at [phone] option 4 or send [place] a message through MyChart.   We are unable to tell what your co-pay for medications will be in advance as this is different depending on your insurance coverage. However, we may be able to find a substitute medication at lower cost or fill out paperwork to get insurance to cover a needed medication.   If a prior authorization is required to get  your medication covered by your insurance company, please allow [place] 1-2 business days to complete this process.  Drug prices often vary depending on where the prescription is filled and some pharmacies may offer cheaper prices.  The website www.goodrx.com contains coupons for medications through different pharmacies. The prices here do not account for what the cost may be with help from insurance (it may be cheaper with your insurance), but the website can give you the price if you did not use any insurance.  - You can print the associated coupon and take it with your prescription to the pharmacy.  - You may also stop by our office during regular business hours and pick up a GoodRx coupon card.  - If you need your prescription sent electronically to a different pharmacy, notify our office through [facility] or by phone at [phone] option 4.     Si Usted Necesita Algo Despus de Su Visita  Tambin puede enviarnos un mensaje a travs de Clinical cytogeneticist. Por lo general respondemos a los mensajes de MyChart en el transcurso de 1 a 2 das hbiles.  Para renovar recetas, por favor pida a su farmacia que se ponga en contacto con nuestra oficina. Annie Sable de fax es [place] [phone].  Si tiene un asunto urgente cuando la clnica est cerrada y que no puede esperar hasta el siguiente da hbil, puede llamar/localizar a su doctor(a) al nmero que aparece a continuacin.   Por favor, tenga en cuenta que aunque hacemos todo lo posible para estar disponibles para asuntos urgentes fuera del horario de [place], no estamos disponibles las 24 horas del da, los 7 [address]  [address] de la [place].   Si tiene un problema urgente y no puede comunicarse con nosotros, puede optar por buscar atencin mdica  en el consultorio de su doctor(a), en una clnica privada, en un centro de atencin urgente o en una sala de emergencias.  Si tiene Engineer, drilling, por favor llame inmediatamente al 911 o vaya a la sala de  emergencias.  Nmeros de bper  - Dr. Gwen Pounds: [phone]  - Dra. Roseanne Reno: [phone]  - Dr. Katrinka Blazing: [phone]   En caso de inclemencias del tiempo, por favor llame a Lacy Duverney principal al [phone] para una actualizacin sobre el [place] de cualquier retraso o cierre.  Consejos para la medicacin en dermatologa: Por favor, guarde las cajas en las que vienen los medicamentos de uso tpico para ayudarle a seguir las instrucciones sobre dnde y cmo usarlos. Las farmacias generalmente imprimen las instrucciones del medicamento slo en las cajas y no directamente en los tubos del [place].   Si su medicamento es muy caro, por favor, pngase en contacto con Rolm Gala llamando al [phone] y presione la opcin 4 o envenos un mensaje a travs de Clinical cytogeneticist.   No podemos decirle cul ser su copago por los medicamentos por adelantado ya que esto es diferente dependiendo  de la cobertura de su seguro. Sin embargo, es posible que podamos encontrar un medicamento sustituto a Audiological scientist un formulario para que el seguro cubra el medicamento que se considera necesario.   Si se requiere una autorizacin previa para que su compaa de seguros [place] su medicamento, por favor permtanos de 1 a 2 das hbiles para completar [address]  Los precios de los medicamentos varan con frecuencia dependiendo del Environmental consultant de dnde se surte la receta y alguna farmacias pueden ofrecer precios ms baratos.  El sitio web www.goodrx.com tiene cupones para medicamentos de Health and safety inspector. Los precios aqu no tienen en cuenta lo que podra costar con la ayuda del seguro (puede ser ms barato con su seguro), pero el sitio web puede darle el precio si no utiliz Tourist information centre manager.  - Puede imprimir el cupn correspondiente y llevarlo con su receta a la farmacia.  - Tambin puede pasar por nuestra oficina durante el horario de atencin regular y Education officer, museum una tarjeta de cupones de GoodRx.  - Si  necesita que su receta se enve electrnicamente a una farmacia diferente, informe a nuestra oficina a travs de MyChart de [place] o por telfono llamando al [phone] y presione la opcin 4.

## 2023-06-25 NOTE — Progress Notes (Signed)
Follow-Up Visit   Subjective  Andrew Walsh is a 82 y.o. male who presents for the following: Skin Cancer Screening and Full Body Skin Exam  The patient presents for Total-Body Skin Exam (TBSE) for skin cancer screening and mole check. The patient has spots, moles and lesions to be evaluated, some may be new or changing and the patient may have concern these could be cancer.  The following portions of the chart were reviewed this encounter and updated as appropriate: medications, allergies, medical history  Review of Systems:  No other skin or systemic complaints except as noted in HPI or Assessment and Plan.  Objective  Well appearing patient in no apparent distress; mood and affect are within normal limits.  A full examination was performed including scalp, head, eyes, ears, nose, lips, neck, chest, axillae, abdomen, back, buttocks, bilateral upper extremities, bilateral lower extremities, hands, feet, fingers, toes, fingernails, and toenails. All findings within normal limits unless otherwise noted below.   Relevant physical exam findings are noted in the Assessment and Plan.    Assessment & Plan   SKIN CANCER SCREENING PERFORMED TODAY.  ACTINIC DAMAGE WITH PRECANCEROUS ACTINIC KERATOSES Counseling for Topical Chemotherapy Management: Patient exhibits: - Severe, confluent actinic changes with pre-cancerous actinic keratoses that is secondary to cumulative UV radiation exposure over time - Condition that is severe; chronic, not at goal. - diffuse scaly erythematous macules and papules with underlying dyspigmentation - Discussed Prescription "Field Treatment" topical Chemotherapy for Severe, Chronic Confluent Actinic Changes with Pre-Cancerous Actinic Keratoses Field treatment involves treatment of an entire area of skin that has confluent Actinic Changes (Sun/ Ultraviolet light damage) and PreCancerous Actinic Keratoses by method of PhotoDynamic Therapy (PDT) and/or prescription  Topical Chemotherapy agents such as 5-fluorouracil, 5-fluorouracil/calcipotriene, and/or imiquimod.  The purpose is to decrease the number of clinically evident and subclinical PreCancerous lesions to prevent progression to development of skin cancer by chemically destroying early precancer changes that may or may not be visible.  It has been shown to reduce the risk of developing skin cancer in the treated area. As a result of treatment, redness, scaling, crusting, and open sores may occur during treatment course. One or more than one of these methods may be used and may have to be used several times to control, suppress and eliminate the PreCancerous changes. Discussed treatment course, expected reaction, and possible side effects. - Recommend daily broad spectrum sunscreen SPF 30+ to sun-exposed areas, reapply every 2 hours as needed.  - Staying in the shade or wearing long sleeves, sun glasses (UVA+UVB protection) and wide brim hats (4-inch brim around the entire circumference of the hat) are also recommended. - Call for new or changing lesions. - Pt would like to restart Efudex cream to treat the arms, pt will contact our office once rash is clear.  LENTIGINES, SEBORRHEIC KERATOSES, HEMANGIOMAS - Benign normal skin lesions - Benign-appearing - Call for any changes  MELANOCYTIC NEVI - Tan-brown and/or pink-flesh-colored symmetric macules and papules - Benign appearing on exam today - Observation - Call clinic for new or changing moles - Recommend daily use of broad spectrum spf 30+ sunscreen to sun-exposed areas.   HISTORY OF SQUAMOUS CELL CARCINOMA OF THE SKIN - No evidence of recurrence today - No lymphadenopathy - Recommend regular full body skin exams - Recommend daily broad spectrum sunscreen SPF 30+ to sun-exposed areas, reapply every 2 hours as needed.  - Call if any new or changing lesions are noted between office visits  HISTORY OF  BASAL CELL CARCINOMA OF THE SKIN - No evidence  of recurrence today - Recommend regular full body skin exams - Recommend daily broad spectrum sunscreen SPF 30+ to sun-exposed areas, reapply every 2 hours as needed.  - Call if any new or changing lesions are noted between office visits  Dermatitis - allergic contact dermatitis to Dove sensitive skin lotion  Exam: Scaly pink papules coalescing to plaques  Treatment Plan: D/C Dove sensitive skin products. Recommend CeraVe cream daily.   Start Mometasone 0.1% cream to aa's BID PRN.   Xerosis - diffuse xerotic patches - recommend gentle, hydrating skin care - gentle skin care handout given - Recommend CeraVe cream daily.  SCAR - Back  Exam: Dyspigmented smooth macule or patch. Benign-appearing.  Observation.  Call clinic for new or changing lesions. Recommend daily broad spectrum sunscreen SPF 30+, reapply every 2 hours as needed.  Treatment: Recommend Serica moisturizing scar formula cream every night or [facility] brand or Mederma silicone scar sheet every night for the first year after a scar appears to help with scar remodeling if desired. Scars remodel on their own for a full year and will gradually improve in appearance over time.  Return in about 6 months (around 12/24/2023) for sun exposed areas, recheck rash.  Maylene Roes, CMA, am acting as scribe for Armida Sans, MD .   Documentation: I have reviewed the above documentation for accuracy and completeness, and I agree with the above.  Armida Sans, MD

## 2023-07-02 ENCOUNTER — Other Ambulatory Visit: Payer: Self-pay | Admitting: Dermatology

## 2023-07-03 ENCOUNTER — Encounter: Payer: Self-pay | Admitting: Dermatology

## 2023-08-17 ENCOUNTER — Inpatient Hospital Stay: Payer: Medicare HMO | Attending: Oncology

## 2023-08-17 DIAGNOSIS — Z87891 Personal history of nicotine dependence: Secondary | ICD-10-CM | POA: Insufficient documentation

## 2023-08-17 DIAGNOSIS — C61 Malignant neoplasm of prostate: Secondary | ICD-10-CM

## 2023-08-17 DIAGNOSIS — Z8546 Personal history of malignant neoplasm of prostate: Secondary | ICD-10-CM | POA: Insufficient documentation

## 2023-08-17 DIAGNOSIS — Z789 Other specified health status: Secondary | ICD-10-CM

## 2023-08-17 DIAGNOSIS — D7589 Other specified diseases of blood and blood-forming organs: Secondary | ICD-10-CM

## 2023-08-17 LAB — CBC WITH DIFFERENTIAL (CANCER CENTER ONLY)
Abs Immature Granulocytes: 0.02 10*3/uL (ref 0.00–0.07)
Basophils Absolute: 0 10*3/uL (ref 0.0–0.1)
Basophils Relative: 1 %
Eosinophils Absolute: 0.1 10*3/uL (ref 0.0–0.5)
Eosinophils Relative: 2 %
HCT: 41 % (ref 39.0–52.0)
Hemoglobin: 14.1 g/dL (ref 13.0–17.0)
Immature Granulocytes: 0 %
Lymphocytes Relative: 22 %
Lymphs Abs: 1 10*3/uL (ref 0.7–4.0)
MCH: 35.6 pg — ABNORMAL HIGH (ref 26.0–34.0)
MCHC: 34.4 g/dL (ref 30.0–36.0)
MCV: 103.5 fL — ABNORMAL HIGH (ref 80.0–100.0)
Monocytes Absolute: 0.6 10*3/uL (ref 0.1–1.0)
Monocytes Relative: 14 %
Neutro Abs: 2.7 10*3/uL (ref 1.7–7.7)
Neutrophils Relative %: 61 %
Platelet Count: 216 10*3/uL (ref 150–400)
RBC: 3.96 MIL/uL — ABNORMAL LOW (ref 4.22–5.81)
RDW: 12.4 % (ref 11.5–15.5)
WBC Count: 4.5 10*3/uL (ref 4.0–10.5)
nRBC: 0 % (ref 0.0–0.2)

## 2023-08-17 LAB — VITAMIN B12: Vitamin B-12: 797 pg/mL (ref 180–914)

## 2023-08-17 LAB — IRON AND TIBC
Iron: 182 ug/dL (ref 45–182)
Saturation Ratios: 59 % — ABNORMAL HIGH (ref 17.9–39.5)
TIBC: 308 ug/dL (ref 250–450)
UIBC: 126 ug/dL

## 2023-08-17 LAB — HEPATIC FUNCTION PANEL
ALT: 19 U/L (ref 0–44)
AST: 23 U/L (ref 15–41)
Albumin: 4.2 g/dL (ref 3.5–5.0)
Alkaline Phosphatase: 53 U/L (ref 38–126)
Bilirubin, Direct: 0.2 mg/dL (ref 0.0–0.2)
Indirect Bilirubin: 0.7 mg/dL (ref 0.3–0.9)
Total Bilirubin: 0.9 mg/dL (ref <1.2)
Total Protein: 7.4 g/dL (ref 6.5–8.1)

## 2023-08-17 LAB — FERRITIN: Ferritin: 115 ng/mL (ref 24–336)

## 2023-08-17 LAB — PSA: Prostatic Specific Antigen: <0.01 ng/mL (ref 0.00–4.00)

## 2023-08-20 LAB — KAPPA/LAMBDA LIGHT CHAINS
Kappa free light chain: 16.8 mg/L (ref 3.3–19.4)
Kappa, lambda light chain ratio: 1.09 (ref 0.26–1.65)
Lambda free light chains: 15.4 mg/L (ref 5.7–26.3)

## 2023-08-22 ENCOUNTER — Encounter: Payer: Self-pay | Admitting: Oncology

## 2023-08-22 ENCOUNTER — Inpatient Hospital Stay: Payer: Medicare HMO

## 2023-08-22 ENCOUNTER — Inpatient Hospital Stay: Payer: Medicare HMO | Admitting: Oncology

## 2023-08-22 DIAGNOSIS — D7589 Other specified diseases of blood and blood-forming organs: Secondary | ICD-10-CM

## 2023-08-22 DIAGNOSIS — E871 Hypo-osmolality and hyponatremia: Secondary | ICD-10-CM | POA: Diagnosis not present

## 2023-08-22 DIAGNOSIS — Z87891 Personal history of nicotine dependence: Secondary | ICD-10-CM | POA: Diagnosis not present

## 2023-08-22 DIAGNOSIS — Z789 Other specified health status: Secondary | ICD-10-CM

## 2023-08-22 DIAGNOSIS — Z8546 Personal history of malignant neoplasm of prostate: Secondary | ICD-10-CM | POA: Diagnosis not present

## 2023-08-22 NOTE — Assessment & Plan Note (Addendum)
#  History of hereditary hemochromatosis.  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 We reviewed the goal of ferritin to be less than 100, iron saturation less than 45%. Lab Results  Component Value Date   IRON 182 08/17/2023   TIBC 308 08/17/2023   IRONPCTSAT 59 (H) 08/17/2023   FERRITIN 115 08/17/2023    Proceed with phlebotomy today.

## 2023-08-22 NOTE — Assessment & Plan Note (Signed)
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  Other differential diagnosis includes bone marrow disorders, B12 deficiency.   He has no cytopenia.  Patient has adequate B12 level.  Normal light chain ratio, Myeloma panel is pending. Observation for now.

## 2023-08-22 NOTE — Assessment & Plan Note (Addendum)
Last ultrasound in 2022 showed no signs of liver cirrhosis. alcohol cessation-previously discussed, encouraged his efforts.

## 2023-08-22 NOTE — Assessment & Plan Note (Signed)
Chronic for him.  Possible secondary to alcohol use.  Observation. Stable.

## 2023-08-22 NOTE — Progress Notes (Signed)
Hematology/Oncology Progress note [phone] [phone]     CHIEF COMPLAINTS/REASON FOR VISIT:  Homozygous hemochromatosis management   ASSESSMENT & PLAN:   Hereditary hemochromatosis (HCC) #History of hereditary hemochromatosis.  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 We reviewed the goal of ferritin to be less than 100, iron saturation less than 45%. Lab Results  Component Value Date   IRON 182 08/17/2023   TIBC 308 08/17/2023   IRONPCTSAT 59 (H) 08/17/2023   FERRITIN 115 08/17/2023    Proceed with phlebotomy today.   Alcohol use Last ultrasound in 2022 showed no signs of liver cirrhosis. alcohol cessation-previously discussed, encouraged his efforts.  Hyponatremia Chronic for him.  Possible secondary to alcohol use.  Observation. Stable.  Macrocytosis without anemia Possibly secondary to alcohol effect on bone marrow.  Other differential diagnosis includes bone marrow disorders, B12 deficiency.   He has no cytopenia.  Patient has adequate B12 level.  Normal light chain ratio, Myeloma panel is pending. Observation for now.    Orders Placed This Encounter  Procedures   CBC with Differential (Cancer Center Only)    Standing Status:   Future    Standing Expiration Date:   08/21/2024   CMP (Cancer Center only)    Standing Status:   Future    Standing Expiration Date:   08/21/2024   Iron and TIBC    Standing Status:   Future    Standing Expiration Date:   08/21/2024   Ferritin    Standing Status:   Future    Standing Expiration Date:   08/21/2024   Follow-up Labs in 6 months prior to MD  All questions were answered. The patient knows to call the clinic with any problems, questions or concerns.  Rickard Patience, MD, PhD [facility] Hematology Oncology 08/22/2023      HISTORY OF PRESENTING ILLNESS:  Andrew Walsh is a 82 y.o. male presents for homozygous hemochromatosis management.  Patient reports a history   hereditary hemochromatosis.  He was diagnosed in 69s by his previous primary care provider in [place] and he has been an active blood donor every 8 to 10 weeks for many years.  Recently he was diagnosed with prostate cancer Gleason score 4+3 with a PSA of 9.4 and underwent IMRT radiation by Dr. Rushie Chestnut and he finished in September 2020. He is mildly anemic and also cannot further donate blood due to recent radiation and was referred to me for management of hemochromatosis and iron overload.  Fatigue: denies He had blood work done at [place] [facility] primary care provider's office recently. 08/19/2019, hemoglobin 14, hematocrit 40.7, MCV 105.7. Iron 254, ferritin 514,  07/18/2019, iron 259, ferritin 476, transferrin 193.5, TIBC 270, iron saturation 96  Patient was seen by gastroenterology Dr. Norma Fredrickson on 10/07/2020, his hematochezia was considered to be secondary to radiation-induced proctitis with telangiectasia.  Patient had a colonoscopy with [facility] on 08/09/2020. Patient was recommended to use Imodium.  As patient continues to have symptoms, he has discussed with Dr. Horace Porteous team in early April 2022 and was advised to use Carafate enema twice daily,  dermatology for cutaneous squamous cell carcinoma,  Status post resection  INTERVAL HISTORY Andrew Walsh is a 82 y.o. male who has above history reviewed by me today presents for follow up visit for management of iron overload/hemochromatosis. Patient has cut down on alcohol consumption, and is not interested in completely stop alcohol consumption. Patient reports feeling okay.  Review of Systems  Constitutional:  Positive for  fatigue. Negative for appetite change, chills, fever and unexpected weight change.  HENT:   Negative for hearing loss and voice change.   Eyes:  Negative for eye problems and icterus.  Respiratory:  Negative for chest tightness, cough and shortness of breath.   Cardiovascular:  Negative for chest pain and leg swelling.   Gastrointestinal:  Negative for abdominal distention, abdominal pain, blood in stool and diarrhea.  Endocrine: Negative for hot flashes.  Genitourinary:  Negative for difficulty urinating, dysuria and frequency.   Musculoskeletal:  Negative for arthralgias.  Skin:  Negative for itching and rash.  Neurological:  Negative for light-headedness and numbness.  Hematological:  Negative for adenopathy. Does not bruise/bleed easily.  Psychiatric/Behavioral:  Negative for confusion.     MEDICAL HISTORY:  Past Medical History:  Diagnosis Date   Actinic keratosis 01/31/2016   Left lateral neck post auricular. AK and prurigo nodularis   Actinic keratosis 01/31/2016   Left lateral neck post auricular, inferior. AK and prurigo nodularis   Actinic keratosis 12/19/2017   Left ear anti-helix   Ankle arthropathy 02/24/2014   Arthritis of foot, degenerative 10/03/2012   Overview:  Subtalar arthritis    Basal cell carcinoma 04/30/2008   Left mid dorsum nose.   Basal cell carcinoma 07/12/2009   Left post. deltoid sup. lat. edge of cancer scar.    Basal cell carcinoma 12/19/2022   Right frontal hair line. Nodular. Mohs 01/08/23   Cancer (HCC)    PROSTATE   Elevated PSA    Hereditary hemochromatosis (HCC) 11/03/2015   Overview:  Treated by regular blood donation    History of kidney stones    HLD (hyperlipidemia) 11/03/2015   Hyperlipidemia    Inguinal hernia    Localized, primary osteoarthritis of ankle or foot 02/24/2014   Osteoarthritis    SCC (squamous cell carcinoma) 12/05/2022   right lateral calf, excised 01/03/23   Squamous cell carcinoma in situ 12/19/2022   Left mid back. SCCis, hypertrophic and epidermat cyst. ED[facility] 01/24/23   Squamous cell carcinoma of skin 05/17/2010   Left forearm. KA-like pattern. EDC.   Squamous cell carcinoma of skin 12/18/2016   Right mid lat. volar forearm.WD SCC. ED[facility]   Squamous cell carcinoma of skin 01/22/2019   Right lateral distal tricep near elbow.     Squamous cell carcinoma of skin 07/14/2019   Right forearm. WD SCC. ED[facility]   Squamous cell carcinoma of skin 09/29/2019   Right proximal mandible. SCCis   Squamous cell carcinoma of skin 08/17/2020   Left ear sup helix   Squamous cell carcinoma of skin 12/19/2022   Right distal pretibia. WD SCC with superficial infiltration and angiokeratoma. [facility] 01/24/23   Uses walker     SURGICAL HISTORY: Past Surgical History:  Procedure Laterality Date   ANKLE FUSION Right 2001, 2016   BONE MARROW ASPIRATION     CATARACT EXTRACTION W/PHACO Left 03/14/2023   Procedure: CATARACT EXTRACTION PHACO AND INTRAOCULAR LENS PLACEMENT (IOC) LEFT MALYUGIN CLAREON VIVITY TORIC  15.21  01:17.4;  Surgeon: Lockie Mola, MD;  Location: [facility];  Service: Ophthalmology;  Laterality: Left;   CATARACT EXTRACTION W/PHACO Right 03/26/2023   Procedure: CATARACT EXTRACTION PHACO AND INTRAOCULAR LENS PLACEMENT (IOC) RIGHT MALYUGIN CLAREON VIVITY TORIC 14.34 01:11.2;  Surgeon: Lockie Mola, MD;  Location: [facility];  Service: Ophthalmology;  Laterality: Right;   COLONOSCOPY WITH PROPOFOL N/A 08/09/2020   Procedure: COLONOSCOPY WITH PROPOFOL;  Surgeon: Toledo, Boykin Nearing, MD;  Location: [facility];  Service: Gastroenterology;  Laterality: N/A;   FLEXIBLE SIGMOIDOSCOPY N/A 01/05/2021   Procedure: FLEXIBLE SIGMOIDOSCOPY;  Surgeon: Toledo, Boykin Nearing, MD;  Location: [facility];  Service: Gastroenterology;  Laterality: N/A;   FLEXIBLE SIGMOIDOSCOPY N/A 03/23/2021   Procedure: FLEXIBLE SIGMOIDOSCOPY;  Surgeon: Toledo, Boykin Nearing, MD;  Location: [facility];  Service: Gastroenterology;  Laterality: N/A;  CRYOTHERAPY PATIENT   FOOT ARTHRODESIS, SUBTALAR     HYDROCELE EXCISION / REPAIR     HYDROCELE EXCISION / REPAIR     JOINT REPLACEMENT Bilateral 02-2004/05-2010   KNEE ARTHROSCOPY     LUMBAR LAMINECTOMY/DECOMPRESSION MICRODISCECTOMY N/A 01/13/2022   Procedure: L3-S1 POSTERIOR SPIAL DECOMPRESSION;   Surgeon: Venetia Night, MD;  Location: [facility];  Service: Neurosurgery;  Laterality: N/A;   REVISION TOTAL HIP ARTHROPLASTY  2006   Right 2011, Left 2006    SOCIAL HISTORY: Social History   Socioeconomic History   Marital status: Married    Spouse name: Not on file   Number of children: Not on file   Years of education: Not on file   Highest education level: Not on file  Occupational History   Not on file  Tobacco Use   Smoking status: Former    Current packs/day: 0.00    Types: Cigarettes    Start date: 09/10/1959    Quit date: 09/10/1979    Years since quitting: 43.9   Smokeless tobacco: Never  Vaping Use   Vaping status: Never Used  Substance and Sexual Activity   Alcohol use: Yes    Alcohol/week: 32.0 standard drinks of alcohol    Types: 16 Glasses of wine, 16 Standard drinks or equivalent per week   Drug use: Never   Sexual activity: Not Currently  Other Topics Concern   Not on file  Social History Narrative   Live at home with wife.   Social Determinants of Health   Financial Resource Strain: Low Risk  (04/02/2023)   Received from [facility]   Overall Financial Resource Strain (CARDIA)    Difficulty of Paying Living Expenses: Not hard at all  Food Insecurity: No Food Insecurity (04/02/2023)   Received from [facility]   Hunger Vital Sign    Worried About Running Out of Food in the Last Year: Never true    Ran Out of Food in the Last Year: Never true  Transportation Needs: No Transportation Needs (04/02/2023)   Received from [facility]    In the past 12 months, has lack of transportation kept you from medical appointments or from getting medications?: No    Lack of Transportation (Non-Medical): No  Physical Activity: Not on file  Stress: Not on file  Social Connections: Not on file  Intimate Partner Violence: Not At Risk (06/27/2022)   Humiliation, Afraid, Rape, and  Kick questionnaire    Fear of Current or Ex-Partner: No    Emotionally Abused: No    Physically Abused: No    Sexually Abused: No    FAMILY HISTORY: Family History  Problem Relation Age of Onset   Hemachromatosis Sister    Bladder Cancer Neg Hx    Prostate cancer Neg Hx    Kidney cancer Neg Hx     ALLERGIES:  is allergic to other.  MEDICATIONS:  Current Outpatient Medications  Medication Sig Dispense Refill   Ascorbic Acid (VITAMIN C) 1000 MG tablet Take 1,000 mg by mouth daily.     Calcium Carbonate-Vit D-Min (CALCIUM 600+D3 PLUS MINERALS) 600-800 MG-UNIT TABS  Take 2 tablets by mouth daily.     Cholecalciferol (VITAMIN D3) 25 MCG (1000 UT) CAPS Take by mouth daily.     Glucosamine HCl 1000 MG TABS Take 2,000 mg by mouth daily.     loperamide (IMODIUM) 2 MG capsule Take 4 mg by mouth daily.     mometasone (ELOCON) 0.1 % cream APPLY TO RASH ON ARMS TWICE DAILY FOR TWO WEEKS AS NEEDED 45 g 0   silver sulfADIAZINE (SILVADENE) 1 % cream Apply topically to wound at affected area daily along with vaseline and cover with bandage daily until healed. 50 g 0   vitamin B-12 (CYANOCOBALAMIN) 500 MCG tablet Take 500 mcg by mouth daily.     No current facility-administered medications for this visit.     PHYSICAL EXAMINATION: ECOG PERFORMANCE STATUS: 1 - Symptomatic but completely ambulatory Vitals:   08/22/23 1435 08/22/23 1445  BP: (!) 177/95 (!) 176/96  Pulse: (!) 51   Resp: 18   Temp: (!) 96.1 F (35.6 C)   SpO2: 100%    Filed Weights   08/22/23 1435  Weight: 169 lb (76.7 kg)    Physical Exam Constitutional:      General: He is not in acute distress.    Comments: He walks with a walker  HENT:     Head: Normocephalic.  Eyes:     General: No scleral icterus. Cardiovascular:     Rate and Rhythm: Normal rate.  Pulmonary:     Effort: Pulmonary effort is normal. No respiratory distress.     Breath sounds: No wheezing.  Abdominal:     General: Bowel sounds are normal.  There is no distension.     Palpations: Abdomen is soft.  Musculoskeletal:        General: No deformity. Normal range of motion.     Cervical back: Normal range of motion and neck supple.  Skin:    General: Skin is warm and dry.     Findings: No rash.  Neurological:     Mental Status: He is alert and oriented to person, place, and time. Mental status is at baseline.     Cranial Nerves: No cranial nerve deficit.  Psychiatric:        Mood and Affect: Mood normal.      LABORATORY DATA:  I have reviewed the data as listed    Latest Ref Rng & Units 08/17/2023   11:32 AM 05/18/2023   11:50 AM 01/05/2023    9:56 AM  CBC  WBC 4.0 - 10.5 K/uL 4.5  4.4  5.3   Hemoglobin 13.0 - 17.0 g/dL 16.1  09.6  04.5   Hematocrit 39.0 - 52.0 % 41.0  40.0  40.2   Platelets 150 - 400 K/uL 216  229  216       Latest Ref Rng & Units 08/17/2023   11:32 AM 05/18/2023   11:50 AM 01/05/2023    9:56 AM  CMP  Glucose 70 - 99 mg/dL  409  811   BUN 8 - 23 mg/dL  13  14   Creatinine 9.14 - 1.24 mg/dL  7.82  9.56   Sodium 213 - 145 mmol/L  132  130   Potassium 3.5 - 5.1 mmol/L  4.6  4.3   Chloride 98 - 111 mmol/L  100  98   CO2 22 - 32 mmol/L  24  23   Calcium 8.9 - 10.3 mg/dL  9.4  9.1   Total Protein 6.5 - 8.1 g/dL  7.4  7.0  7.4   Total Bilirubin <1.2 mg/dL 0.9  0.9  0.9   Alkaline Phos 38 - 126 U/L 53  59  55   AST 15 - 41 U/L 23  24  22    ALT 0 - 44 U/L 19  18  15       Iron/TIBC/Ferritin/ %Sat    Component Value Date/Time   IRON 182 08/17/2023 1132   TIBC 308 08/17/2023 1132   FERRITIN 115 08/17/2023 1132   IRONPCTSAT 59 (H) 08/17/2023 1132      RADIOGRAPHIC STUDIES: I have personally reviewed the radiological images as listed and agreed with the findings in the report.  No results found.

## 2023-08-23 LAB — MULTIPLE MYELOMA PANEL, SERUM
Albumin SerPl Elph-Mcnc: 4 g/dL (ref 2.9–4.4)
Albumin/Glob SerPl: 1.5 (ref 0.7–1.7)
Alpha 1: 0.2 g/dL (ref 0.0–0.4)
Alpha2 Glob SerPl Elph-Mcnc: 0.7 g/dL (ref 0.4–1.0)
B-Globulin SerPl Elph-Mcnc: 1.2 g/dL (ref 0.7–1.3)
Gamma Glob SerPl Elph-Mcnc: 0.8 g/dL (ref 0.4–1.8)
Globulin, Total: 2.8 g/dL (ref 2.2–3.9)
IgA: 494 mg/dL — ABNORMAL HIGH (ref 61–437)
IgG (Immunoglobin G), Serum: 926 mg/dL (ref 603–1613)
IgM (Immunoglobulin M), Srm: 29 mg/dL (ref 15–143)
Total Protein ELP: 6.8 g/dL (ref 6.0–8.5)

## 2023-09-26 DIAGNOSIS — E785 Hyperlipidemia, unspecified: Secondary | ICD-10-CM | POA: Diagnosis not present

## 2023-09-26 DIAGNOSIS — I10 Essential (primary) hypertension: Secondary | ICD-10-CM | POA: Diagnosis not present

## 2023-10-03 DIAGNOSIS — Z8546 Personal history of malignant neoplasm of prostate: Secondary | ICD-10-CM | POA: Diagnosis not present

## 2023-10-03 DIAGNOSIS — I10 Essential (primary) hypertension: Secondary | ICD-10-CM | POA: Diagnosis not present

## 2023-10-03 DIAGNOSIS — Z131 Encounter for screening for diabetes mellitus: Secondary | ICD-10-CM | POA: Diagnosis not present

## 2023-10-03 DIAGNOSIS — E785 Hyperlipidemia, unspecified: Secondary | ICD-10-CM | POA: Diagnosis not present

## 2023-10-08 DIAGNOSIS — Z08 Encounter for follow-up examination after completed treatment for malignant neoplasm: Secondary | ICD-10-CM | POA: Diagnosis not present

## 2023-10-08 DIAGNOSIS — Z85828 Personal history of other malignant neoplasm of skin: Secondary | ICD-10-CM | POA: Diagnosis not present

## 2023-10-08 DIAGNOSIS — C44629 Squamous cell carcinoma of skin of left upper limb, including shoulder: Secondary | ICD-10-CM | POA: Diagnosis not present

## 2023-10-08 DIAGNOSIS — L821 Other seborrheic keratosis: Secondary | ICD-10-CM | POA: Diagnosis not present

## 2023-10-08 DIAGNOSIS — D2261 Melanocytic nevi of right upper limb, including shoulder: Secondary | ICD-10-CM | POA: Diagnosis not present

## 2023-10-08 DIAGNOSIS — D2262 Melanocytic nevi of left upper limb, including shoulder: Secondary | ICD-10-CM | POA: Diagnosis not present

## 2023-10-08 DIAGNOSIS — C44229 Squamous cell carcinoma of skin of left ear and external auricular canal: Secondary | ICD-10-CM | POA: Diagnosis not present

## 2023-10-08 DIAGNOSIS — C44529 Squamous cell carcinoma of skin of other part of trunk: Secondary | ICD-10-CM | POA: Diagnosis not present

## 2023-10-08 DIAGNOSIS — D485 Neoplasm of uncertain behavior of skin: Secondary | ICD-10-CM | POA: Diagnosis not present

## 2023-10-08 DIAGNOSIS — D225 Melanocytic nevi of trunk: Secondary | ICD-10-CM | POA: Diagnosis not present

## 2023-10-08 DIAGNOSIS — L570 Actinic keratosis: Secondary | ICD-10-CM | POA: Diagnosis not present

## 2023-10-08 DIAGNOSIS — D0461 Carcinoma in situ of skin of right upper limb, including shoulder: Secondary | ICD-10-CM | POA: Diagnosis not present

## 2023-10-18 DIAGNOSIS — C44229 Squamous cell carcinoma of skin of left ear and external auricular canal: Secondary | ICD-10-CM | POA: Diagnosis not present

## 2023-10-18 DIAGNOSIS — L814 Other melanin hyperpigmentation: Secondary | ICD-10-CM | POA: Diagnosis not present

## 2023-10-18 DIAGNOSIS — L578 Other skin changes due to chronic exposure to nonionizing radiation: Secondary | ICD-10-CM | POA: Diagnosis not present

## 2023-10-18 DIAGNOSIS — L988 Other specified disorders of the skin and subcutaneous tissue: Secondary | ICD-10-CM | POA: Diagnosis not present

## 2023-10-19 DIAGNOSIS — H04123 Dry eye syndrome of bilateral lacrimal glands: Secondary | ICD-10-CM | POA: Diagnosis not present

## 2023-10-19 DIAGNOSIS — H43813 Vitreous degeneration, bilateral: Secondary | ICD-10-CM | POA: Diagnosis not present

## 2023-10-19 DIAGNOSIS — Z961 Presence of intraocular lens: Secondary | ICD-10-CM | POA: Diagnosis not present

## 2023-11-02 DIAGNOSIS — L244 Irritant contact dermatitis due to drugs in contact with skin: Secondary | ICD-10-CM | POA: Diagnosis not present

## 2023-11-02 DIAGNOSIS — L0101 Non-bullous impetigo: Secondary | ICD-10-CM | POA: Diagnosis not present

## 2023-11-29 DIAGNOSIS — C44529 Squamous cell carcinoma of skin of other part of trunk: Secondary | ICD-10-CM | POA: Diagnosis not present

## 2023-12-24 DIAGNOSIS — L905 Scar conditions and fibrosis of skin: Secondary | ICD-10-CM | POA: Diagnosis not present

## 2023-12-24 DIAGNOSIS — D485 Neoplasm of uncertain behavior of skin: Secondary | ICD-10-CM | POA: Diagnosis not present

## 2023-12-24 DIAGNOSIS — R208 Other disturbances of skin sensation: Secondary | ICD-10-CM | POA: Diagnosis not present

## 2023-12-24 DIAGNOSIS — C44629 Squamous cell carcinoma of skin of left upper limb, including shoulder: Secondary | ICD-10-CM | POA: Diagnosis not present

## 2023-12-24 DIAGNOSIS — C44729 Squamous cell carcinoma of skin of left lower limb, including hip: Secondary | ICD-10-CM | POA: Diagnosis not present

## 2024-01-08 DIAGNOSIS — D0461 Carcinoma in situ of skin of right upper limb, including shoulder: Secondary | ICD-10-CM | POA: Diagnosis not present

## 2024-01-30 ENCOUNTER — Ambulatory Visit: Payer: Medicare HMO | Admitting: Dermatology

## 2024-02-11 DIAGNOSIS — D225 Melanocytic nevi of trunk: Secondary | ICD-10-CM | POA: Diagnosis not present

## 2024-02-11 DIAGNOSIS — D2261 Melanocytic nevi of right upper limb, including shoulder: Secondary | ICD-10-CM | POA: Diagnosis not present

## 2024-02-11 DIAGNOSIS — L821 Other seborrheic keratosis: Secondary | ICD-10-CM | POA: Diagnosis not present

## 2024-02-11 DIAGNOSIS — Z08 Encounter for follow-up examination after completed treatment for malignant neoplasm: Secondary | ICD-10-CM | POA: Diagnosis not present

## 2024-02-11 DIAGNOSIS — Z85828 Personal history of other malignant neoplasm of skin: Secondary | ICD-10-CM | POA: Diagnosis not present

## 2024-02-11 DIAGNOSIS — D2271 Melanocytic nevi of right lower limb, including hip: Secondary | ICD-10-CM | POA: Diagnosis not present

## 2024-02-11 DIAGNOSIS — D2262 Melanocytic nevi of left upper limb, including shoulder: Secondary | ICD-10-CM | POA: Diagnosis not present

## 2024-02-11 DIAGNOSIS — D485 Neoplasm of uncertain behavior of skin: Secondary | ICD-10-CM | POA: Diagnosis not present

## 2024-02-11 DIAGNOSIS — D0461 Carcinoma in situ of skin of right upper limb, including shoulder: Secondary | ICD-10-CM | POA: Diagnosis not present

## 2024-02-11 DIAGNOSIS — D2272 Melanocytic nevi of left lower limb, including hip: Secondary | ICD-10-CM | POA: Diagnosis not present

## 2024-02-11 DIAGNOSIS — C44722 Squamous cell carcinoma of skin of right lower limb, including hip: Secondary | ICD-10-CM | POA: Diagnosis not present

## 2024-02-22 ENCOUNTER — Inpatient Hospital Stay: Payer: Medicare HMO | Attending: Oncology

## 2024-02-22 DIAGNOSIS — Z87891 Personal history of nicotine dependence: Secondary | ICD-10-CM | POA: Insufficient documentation

## 2024-02-22 LAB — CMP (CANCER CENTER ONLY)
ALT: 20 U/L (ref 0–44)
AST: 28 U/L (ref 15–41)
Albumin: 4.1 g/dL (ref 3.5–5.0)
Alkaline Phosphatase: 60 U/L (ref 38–126)
Anion gap: 8 (ref 5–15)
BUN: 12 mg/dL (ref 8–23)
CO2: 23 mmol/L (ref 22–32)
Calcium: 8.7 mg/dL — ABNORMAL LOW (ref 8.9–10.3)
Chloride: 101 mmol/L (ref 98–111)
Creatinine: 0.9 mg/dL (ref 0.61–1.24)
GFR, Estimated: >60 mL/min (ref >60)
Glucose, Bld: 123 mg/dL — ABNORMAL HIGH (ref 70–99)
Potassium: 4 mmol/L (ref 3.5–5.1)
Sodium: 132 mmol/L — ABNORMAL LOW (ref 135–145)
Total Bilirubin: 0.8 mg/dL (ref 0.0–1.2)
Total Protein: 7.1 g/dL (ref 6.5–8.1)

## 2024-02-22 LAB — CBC WITH DIFFERENTIAL (CANCER CENTER ONLY)
Abs Immature Granulocytes: 0.03 10*3/uL (ref 0.00–0.07)
Basophils Absolute: 0 10*3/uL (ref 0.0–0.1)
Basophils Relative: 0 %
Eosinophils Absolute: 0.1 10*3/uL (ref 0.0–0.5)
Eosinophils Relative: 1 %
HCT: 39.6 % (ref 39.0–52.0)
Hemoglobin: 13.7 g/dL (ref 13.0–17.0)
Immature Granulocytes: 1 %
Lymphocytes Relative: 24 %
Lymphs Abs: 1.2 10*3/uL (ref 0.7–4.0)
MCH: 35.4 pg — ABNORMAL HIGH (ref 26.0–34.0)
MCHC: 34.6 g/dL (ref 30.0–36.0)
MCV: 102.3 fL — ABNORMAL HIGH (ref 80.0–100.0)
Monocytes Absolute: 0.5 10*3/uL (ref 0.1–1.0)
Monocytes Relative: 11 %
Neutro Abs: 3.3 10*3/uL (ref 1.7–7.7)
Neutrophils Relative %: 63 %
Platelet Count: 225 10*3/uL (ref 150–400)
RBC: 3.87 MIL/uL — ABNORMAL LOW (ref 4.22–5.81)
RDW: 13 % (ref 11.5–15.5)
WBC Count: 5.1 10*3/uL (ref 4.0–10.5)
nRBC: 0 % (ref 0.0–0.2)

## 2024-02-22 LAB — IRON AND TIBC
Iron: 208 ug/dL — ABNORMAL HIGH (ref 45–182)
Saturation Ratios: 71 % — ABNORMAL HIGH (ref 17.9–39.5)
TIBC: 294 ug/dL (ref 250–450)
UIBC: 86 ug/dL

## 2024-02-22 LAB — FERRITIN: Ferritin: 143 ng/mL (ref 24–336)

## 2024-02-27 ENCOUNTER — Inpatient Hospital Stay (HOSPITAL_BASED_OUTPATIENT_CLINIC_OR_DEPARTMENT_OTHER): Payer: Medicare HMO | Admitting: Oncology

## 2024-02-27 ENCOUNTER — Encounter: Payer: Self-pay | Admitting: Oncology

## 2024-02-27 ENCOUNTER — Inpatient Hospital Stay: Payer: Medicare HMO

## 2024-02-27 DIAGNOSIS — E871 Hypo-osmolality and hyponatremia: Secondary | ICD-10-CM | POA: Diagnosis not present

## 2024-02-27 DIAGNOSIS — F1090 Alcohol use, unspecified, uncomplicated: Secondary | ICD-10-CM

## 2024-02-27 DIAGNOSIS — Z87891 Personal history of nicotine dependence: Secondary | ICD-10-CM | POA: Diagnosis not present

## 2024-02-27 DIAGNOSIS — D7589 Other specified diseases of blood and blood-forming organs: Secondary | ICD-10-CM | POA: Diagnosis not present

## 2024-02-27 NOTE — Assessment & Plan Note (Addendum)
#  History of hereditary hemochromatosis.  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 We reviewed the goal of ferritin to be less than 100, iron saturation less than 45%. Lab Results  Component Value Date   IRON 208 (H) 02/22/2024   TIBC 294 02/22/2024   IRONPCTSAT 71 (H) 02/22/2024   FERRITIN 143 02/22/2024    Proceed with phlebotomy 300 ml  today.

## 2024-02-27 NOTE — Assessment & Plan Note (Signed)
 Chronic for him.  Possible secondary to alcohol use.  Observation. Stable.

## 2024-02-27 NOTE — Patient Instructions (Signed)

## 2024-02-27 NOTE — Assessment & Plan Note (Signed)
 Last ultrasound in 2022 showed no signs of liver cirrhosis. alcohol cessation-previously discussed, encouraged his efforts.

## 2024-02-27 NOTE — Progress Notes (Signed)
 Hematology/Oncology Progress note [phone] [phone]     CHIEF COMPLAINTS/REASON FOR VISIT:  Homozygous hemochromatosis management   ASSESSMENT & PLAN:   Hereditary hemochromatosis (HCC) #History of hereditary hemochromatosis.  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 We reviewed the goal of ferritin to be less than 100, iron saturation less than 45%. Lab Results  Component Value Date   IRON 208 (H) 02/22/2024   TIBC 294 02/22/2024   IRONPCTSAT 71 (H) 02/22/2024   FERRITIN 143 02/22/2024    Proceed with phlebotomy 300 ml  today.   Macrocytosis without anemia Possibly secondary to alcohol  effect on bone marrow.  Other differential diagnosis includes bone marrow disorders, B12 deficiency.   He has no cytopenia.  Patient has adequate B12 level.  Normal light chain ratio, Myeloma panel is pending. Observation for now.   Hyponatremia Chronic for him.  Possible secondary to alcohol  use.  Observation. Stable.  Alcohol  use Last ultrasound in 2022 showed no signs of liver cirrhosis. alcohol  cessation-previously discussed, encouraged his efforts.   Orders Placed This Encounter  Procedures   Ferritin    Standing Status:   Future    Expected Date:   08/28/2024    Expiration Date:   11/26/2024   Iron and TIBC    Standing Status:   Future    Expected Date:   08/28/2024    Expiration Date:   11/26/2024   CMP (Cancer Center only)    Standing Status:   Future    Expected Date:   08/28/2024    Expiration Date:   11/26/2024   CBC with Differential (Cancer Center Only)    Standing Status:   Future    Expected Date:   08/28/2024    Expiration Date:   11/26/2024   Follow-up Labs in 6 months prior to MD  All questions were answered. The patient knows to call the clinic with any problems, questions or concerns.  Timmy Forbes, MD, PhD [facility] Hematology Oncology 02/27/2024      HISTORY OF PRESENTING ILLNESS:  Andrew Walsh is a 83  y.o. male presents for homozygous hemochromatosis management.  Patient reports a history  hereditary hemochromatosis.  He was diagnosed in 55s by his previous primary care provider in [place]  and he has been an active blood donor every 8 to 10 weeks for many years.  Recently he was diagnosed with prostate cancer Gleason score 4+3 with a PSA of 9.4 and underwent IMRT radiation by Dr. Jacalyn Martin and he finished in September 2020. He is mildly anemic and also cannot further donate blood due to recent radiation and was referred to me for management of hemochromatosis and iron overload.  Fatigue: denies He had blood work done at [facility] primary care provider's office recently. 08/19/2019, hemoglobin 14, hematocrit 40.7, MCV 105.7. Iron 254, ferritin 514,  07/18/2019, iron 259, ferritin 476, transferrin 193.5, TIBC 270, iron saturation 96  Patient was seen by gastroenterology Dr. Corky Diener on 10/07/2020, his hematochezia was considered to be secondary to radiation-induced proctitis with telangiectasia.  Patient had a colonoscopy with [facility] on 08/09/2020. Patient was recommended to use Imodium .  As patient continues to have symptoms, he has discussed with Dr. Nolene Baumgarten team in early April 2022 and was advised to use Carafate enema twice daily,  dermatology for cutaneous squamous cell carcinoma,  Status post resection  INTERVAL HISTORY Andrew Walsh is a 83 y.o. male who has above history reviewed by me today presents for follow up visit for management  of iron overload/hemochromatosis. Patient has cut down on alcohol  consumption, and is not interested in completely stop alcohol  consumption. Patient reports feeling okay.  Review of Systems  Constitutional:  Positive for fatigue. Negative for appetite change, chills, fever and unexpected weight change.  HENT:   Negative for hearing loss and voice change.   Eyes:  Negative for eye problems and icterus.  Respiratory:  Negative for chest tightness,  cough and shortness of breath.   Cardiovascular:  Negative for chest pain and leg swelling.  Gastrointestinal:  Negative for abdominal distention, abdominal pain, blood in stool and diarrhea.  Endocrine: Negative for hot flashes.  Genitourinary:  Negative for difficulty urinating, dysuria and frequency.   Musculoskeletal:  Negative for arthralgias.  Skin:  Negative for itching and rash.  Neurological:  Negative for light-headedness and numbness.  Hematological:  Negative for adenopathy. Does not bruise/bleed easily.  Psychiatric/Behavioral:  Negative for confusion.     MEDICAL HISTORY:  Past Medical History:  Diagnosis Date   Actinic keratosis 01/31/2016   Left lateral neck post auricular. AK and prurigo nodularis   Actinic keratosis 01/31/2016   Left lateral neck post auricular, inferior. AK and prurigo nodularis   Actinic keratosis 12/19/2017   Left ear anti-helix   Ankle arthropathy 02/24/2014   Arthritis of foot, degenerative 10/03/2012   Overview:  Subtalar arthritis    Basal cell carcinoma 04/30/2008   Left mid dorsum nose.   Basal cell carcinoma 07/12/2009   Left post. deltoid sup. lat. edge of cancer scar.    Basal cell carcinoma 12/19/2022   Right frontal hair line. Nodular. Mohs 01/08/23   Cancer (HCC)    PROSTATE   Elevated PSA    Hereditary hemochromatosis (HCC) 11/03/2015   Overview:  Treated by regular blood donation    History of kidney stones    HLD (hyperlipidemia) 11/03/2015   Hyperlipidemia    Inguinal hernia    Localized, primary osteoarthritis of ankle or foot 02/24/2014   Osteoarthritis    SCC (squamous cell carcinoma) 12/05/2022   right lateral calf, excised 01/03/23   Squamous cell carcinoma in situ 12/19/2022   Left mid back. SCCis, hypertrophic and epidermat cyst. [facility] 01/24/23   Squamous cell carcinoma of skin 05/17/2010   Left forearm. KA-like pattern. [facility].   Squamous cell carcinoma of skin 12/18/2016   Right mid lat. volar forearm.WD SCC. [facility]    Squamous cell carcinoma of skin 01/22/2019   Right lateral distal tricep near elbow.    Squamous cell carcinoma of skin 07/14/2019   Right forearm. WD SCC. [facility]   Squamous cell carcinoma of skin 09/29/2019   Right proximal mandible. SCCis   Squamous cell carcinoma of skin 08/17/2020   Left ear sup helix   Squamous cell carcinoma of skin 12/19/2022   Right distal pretibia. WD SCC with superficial infiltration and angiokeratoma. [facility] 01/24/23   Uses walker     SURGICAL HISTORY: Past Surgical History:  Procedure Laterality Date   ANKLE FUSION Right 2001, 2016   BONE MARROW ASPIRATION     CATARACT EXTRACTION W/PHACO Left 03/14/2023   Procedure: CATARACT EXTRACTION PHACO AND INTRAOCULAR LENS PLACEMENT (IOC) LEFT MALYUGIN CLAREON VIVITY TORIC  15.21  01:17.4;  Surgeon: Annell Kidney, MD;  Location: [facility];  Service: Ophthalmology;  Laterality: Left;   CATARACT EXTRACTION W/PHACO Right 03/26/2023   Procedure: CATARACT EXTRACTION PHACO AND INTRAOCULAR LENS PLACEMENT (IOC) RIGHT MALYUGIN CLAREON VIVITY TORIC 14.34 01:11.2;  Surgeon: Annell Kidney, MD;  Location: [facility];  Service: Ophthalmology;  Laterality: Right;   COLONOSCOPY WITH PROPOFOL  N/A 08/09/2020   Procedure: COLONOSCOPY WITH PROPOFOL ;  Surgeon: Toledo, Alphonsus Jeans, MD;  Location: [facility];  Service: Gastroenterology;  Laterality: N/A;   FLEXIBLE SIGMOIDOSCOPY N/A 01/05/2021   Procedure: FLEXIBLE SIGMOIDOSCOPY;  Surgeon: Toledo, Alphonsus Jeans, MD;  Location: [facility];  Service: Gastroenterology;  Laterality: N/A;   FLEXIBLE SIGMOIDOSCOPY N/A 03/23/2021   Procedure: FLEXIBLE SIGMOIDOSCOPY;  Surgeon: Toledo, Alphonsus Jeans, MD;  Location: [facility];  Service: Gastroenterology;  Laterality: N/A;  CRYOTHERAPY PATIENT   FOOT ARTHRODESIS, SUBTALAR     HYDROCELE EXCISION / REPAIR     HYDROCELE EXCISION / REPAIR     JOINT REPLACEMENT Bilateral 02-2004/05-2010   KNEE ARTHROSCOPY     LUMBAR  LAMINECTOMY/DECOMPRESSION MICRODISCECTOMY N/A 01/13/2022   Procedure: L3-S1 POSTERIOR SPIAL DECOMPRESSION;  Surgeon: Jodeen Munch, MD;  Location: [facility];  Service: Neurosurgery;  Laterality: N/A;   REVISION TOTAL HIP ARTHROPLASTY  2006   Right 2011, Left 2006    SOCIAL HISTORY: Social History   Socioeconomic History   Marital status: Married    Spouse name: Not on file   Number of children: Not on file   Years of education: Not on file   Highest education level: Not on file  Occupational History   Not on file  Tobacco Use   Smoking status: Former    Current packs/day: 0.00    Types: Cigarettes    Start date: 09/10/1959    Quit date: 09/10/1979    Years since quitting: 44.4   Smokeless tobacco: Never  Vaping Use   Vaping status: Never Used  Substance and Sexual Activity   Alcohol  use: Yes    Alcohol /week: 32.0 standard drinks of alcohol     Types: 16 Glasses of wine, 16 Standard drinks or equivalent per week   Drug use: Never   Sexual activity: Not Currently  Other Topics Concern   Not on file  Social History Narrative   Live at home with wife.   Social Drivers of Corporate investment banker Strain: Low Risk  (04/02/2023)   Received [facility]   Overall Financial Resource Strain (CARDIA)    Difficulty of Paying Living Expenses: Not hard at all  Food Insecurity: No Food Insecurity (04/02/2023)   Received from [facility]   Hunger Vital Sign    Within the past 12 months, you worried that your food would run out before you got the money to buy more.: Never true    Within the past 12 months, the food you bought just didn't last and you didn't have money to get more.: Never true  Transportation Needs: No Transportation Needs (04/02/2023)   Received from [facility] - Transportation    In the past 12 months, has lack of transportation kept you from medical appointments or from getting medications?: No     Lack of Transportation (Non-Medical): No  Physical Activity: Not on file  Stress: Not on file  Social Connections: Not on file  Intimate Partner Violence: Not At Risk (06/27/2022)   Humiliation, Afraid, Rape, and Kick questionnaire    Fear of Current or Ex-Partner: No    Emotionally Abused: No    Physically Abused: No    Sexually Abused: No    FAMILY HISTORY: Family History  Problem Relation Age of Onset   Hemachromatosis Sister    Bladder Cancer Neg Hx    Prostate cancer Neg Hx  Kidney cancer Neg Hx     ALLERGIES:  is allergic to other.  MEDICATIONS:  Current Outpatient Medications  Medication Sig Dispense Refill   Ascorbic Acid  (VITAMIN C) 1000 MG tablet Take 1,000 mg by mouth daily.     Calcium  Carbonate-Vit D-Min (CALCIUM  600+D3 PLUS MINERALS) 600-800 MG-UNIT TABS Take 2 tablets by mouth daily.     Cholecalciferol  (VITAMIN D3) 25 MCG (1000 UT) CAPS Take by mouth daily.     Glucosamine HCl 1000 MG TABS Take 2,000 mg by mouth daily.     loperamide  (IMODIUM ) 2 MG capsule Take 4 mg by mouth daily.     mometasone  (ELOCON ) 0.1 % cream APPLY TO RASH ON ARMS TWICE DAILY FOR TWO WEEKS AS NEEDED 45 g 0   silver  sulfADIAZINE  (SILVADENE ) 1 % cream Apply topically to wound at affected area daily along with vaseline and cover with bandage daily until healed. 50 g 0   vitamin B-12 (CYANOCOBALAMIN ) 500 MCG tablet Take 500 mcg by mouth daily.     No current facility-administered medications for this visit.     PHYSICAL EXAMINATION: ECOG PERFORMANCE STATUS: 1 - Symptomatic but completely ambulatory Vitals:   02/27/24 1305  BP: (!) 143/87  Pulse: 61  Resp: 16  Temp: (!) 97.1 F (36.2 C)  SpO2: 100%   Filed Weights   02/27/24 1305  Weight: 166 lb (75.3 kg)    Physical Exam Constitutional:      General: He is not in acute distress.    Comments: He walks with a walker  HENT:     Head: Normocephalic.   Eyes:     General: No scleral icterus.   Cardiovascular:      Rate and Rhythm: Normal rate.  Pulmonary:     Effort: Pulmonary effort is normal. No respiratory distress.     Breath sounds: No wheezing.  Abdominal:     General: Bowel sounds are normal. There is no distension.     Palpations: Abdomen is soft.   Musculoskeletal:        General: Swelling present. No deformity. Normal range of motion.     Cervical back: Normal range of motion and neck supple.   Skin:    General: Skin is warm and dry.     Findings: No rash.   Neurological:     Mental Status: He is alert and oriented to person, place, and time. Mental status is at baseline.     Cranial Nerves: No cranial nerve deficit.   Psychiatric:        Mood and Affect: Mood normal.      LABORATORY DATA:  I have reviewed the data as listed    Latest Ref Rng & Units 02/22/2024   11:20 AM 08/17/2023   11:32 AM 05/18/2023   11:50 AM  CBC  WBC 4.0 - 10.5 K/uL 5.1  4.5  4.4   Hemoglobin 13.0 - 17.0 g/dL 14.7  82.9  56.2   Hematocrit 39.0 - 52.0 % 39.6  41.0  40.0   Platelets 150 - 400 K/uL 225  216  229       Latest Ref Rng & Units 02/22/2024   11:20 AM 08/17/2023   11:32 AM 05/18/2023   11:50 AM  CMP  Glucose 70 - 99 mg/dL 130   865   BUN 8 - 23 mg/dL 12   13   Creatinine 7.84 - 1.24 mg/dL 6.96   2.95   Sodium 284 - 145 mmol/L 132   132  Potassium 3.5 - 5.1 mmol/L 4.0   4.6   Chloride 98 - 111 mmol/L 101   100   CO2 22 - 32 mmol/L 23   24   Calcium  8.9 - 10.3 mg/dL 8.7   9.4   Total Protein 6.5 - 8.1 g/dL 7.1  7.4  7.0   Total Bilirubin 0.0 - 1.2 mg/dL 0.8  0.9  0.9   Alkaline Phos 38 - 126 U/L 60  53  59   AST 15 - 41 U/L 28  23  24    ALT 0 - 44 U/L 20  19  18       Iron/TIBC/Ferritin/ %Sat    Component Value Date/Time   IRON 208 (H) 02/22/2024 1120   TIBC 294 02/22/2024 1120   FERRITIN 143 02/22/2024 1120   IRONPCTSAT 71 (H) 02/22/2024 1120      RADIOGRAPHIC STUDIES: I have personally reviewed the radiological images as listed and agreed with the findings in the report.   No results found.

## 2024-02-27 NOTE — Progress Notes (Signed)
 Andrew Walsh presents today for phlebotomy per MD orders. Phlebotomy procedure started at 1335 and ended at 1342. 300 mls removed. Patient tolerated procedure well. IV needle removed intact.

## 2024-02-27 NOTE — Assessment & Plan Note (Signed)
 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  Other differential diagnosis includes bone marrow disorders, B12 deficiency.   He has no cytopenia.  Patient has adequate B12 level.  Normal light chain ratio, Myeloma panel is pending. Observation for now.

## 2024-03-03 DIAGNOSIS — C44729 Squamous cell carcinoma of skin of left lower limb, including hip: Secondary | ICD-10-CM | POA: Diagnosis not present

## 2024-03-31 DIAGNOSIS — E785 Hyperlipidemia, unspecified: Secondary | ICD-10-CM | POA: Diagnosis not present

## 2024-03-31 DIAGNOSIS — I10 Essential (primary) hypertension: Secondary | ICD-10-CM | POA: Diagnosis not present

## 2024-03-31 DIAGNOSIS — Z131 Encounter for screening for diabetes mellitus: Secondary | ICD-10-CM | POA: Diagnosis not present

## 2024-04-01 DIAGNOSIS — C44729 Squamous cell carcinoma of skin of left lower limb, including hip: Secondary | ICD-10-CM | POA: Diagnosis not present

## 2024-04-01 DIAGNOSIS — D485 Neoplasm of uncertain behavior of skin: Secondary | ICD-10-CM | POA: Diagnosis not present

## 2024-04-01 DIAGNOSIS — R208 Other disturbances of skin sensation: Secondary | ICD-10-CM | POA: Diagnosis not present

## 2024-04-01 DIAGNOSIS — D0461 Carcinoma in situ of skin of right upper limb, including shoulder: Secondary | ICD-10-CM | POA: Diagnosis not present

## 2024-04-07 DIAGNOSIS — I10 Essential (primary) hypertension: Secondary | ICD-10-CM | POA: Diagnosis not present

## 2024-04-07 DIAGNOSIS — Z8546 Personal history of malignant neoplasm of prostate: Secondary | ICD-10-CM | POA: Diagnosis not present

## 2024-04-07 DIAGNOSIS — D692 Other nonthrombocytopenic purpura: Secondary | ICD-10-CM | POA: Diagnosis not present

## 2024-04-07 DIAGNOSIS — Z0000 Encounter for general adult medical examination without abnormal findings: Secondary | ICD-10-CM | POA: Diagnosis not present

## 2024-04-07 DIAGNOSIS — Z131 Encounter for screening for diabetes mellitus: Secondary | ICD-10-CM | POA: Diagnosis not present

## 2024-04-07 DIAGNOSIS — M48061 Spinal stenosis, lumbar region without neurogenic claudication: Secondary | ICD-10-CM | POA: Diagnosis not present

## 2024-04-07 DIAGNOSIS — Z1331 Encounter for screening for depression: Secondary | ICD-10-CM | POA: Diagnosis not present

## 2024-04-07 DIAGNOSIS — E785 Hyperlipidemia, unspecified: Secondary | ICD-10-CM | POA: Diagnosis not present

## 2024-04-22 DIAGNOSIS — C44722 Squamous cell carcinoma of skin of right lower limb, including hip: Secondary | ICD-10-CM | POA: Diagnosis not present

## 2024-05-20 DIAGNOSIS — C44729 Squamous cell carcinoma of skin of left lower limb, including hip: Secondary | ICD-10-CM | POA: Diagnosis not present

## 2024-08-22 ENCOUNTER — Inpatient Hospital Stay: Attending: Oncology

## 2024-08-22 LAB — CBC WITH DIFFERENTIAL (CANCER CENTER ONLY)
Abs Immature Granulocytes: 0.03 K/uL (ref 0.00–0.07)
Basophils Absolute: 0 K/uL (ref 0.0–0.1)
Basophils Relative: 1 %
Eosinophils Absolute: 0.1 K/uL (ref 0.0–0.5)
Eosinophils Relative: 2 %
HCT: 40 % (ref 39.0–52.0)
Hemoglobin: 13.8 g/dL (ref 13.0–17.0)
Immature Granulocytes: 1 %
Lymphocytes Relative: 23 %
Lymphs Abs: 1.2 K/uL (ref 0.7–4.0)
MCH: 35.4 pg — ABNORMAL HIGH (ref 26.0–34.0)
MCHC: 34.5 g/dL (ref 30.0–36.0)
MCV: 102.6 fL — ABNORMAL HIGH (ref 80.0–100.0)
Monocytes Absolute: 0.8 K/uL (ref 0.1–1.0)
Monocytes Relative: 15 %
Neutro Abs: 3.2 K/uL (ref 1.7–7.7)
Neutrophils Relative %: 58 %
Platelet Count: 218 K/uL (ref 150–400)
RBC: 3.9 MIL/uL — ABNORMAL LOW (ref 4.22–5.81)
RDW: 12.7 % (ref 11.5–15.5)
WBC Count: 5.4 K/uL (ref 4.0–10.5)
nRBC: 0 % (ref 0.0–0.2)

## 2024-08-22 LAB — IRON AND TIBC
Iron: 150 ug/dL (ref 45–182)
Saturation Ratios: 51 % — ABNORMAL HIGH (ref 17.9–39.5)
TIBC: 294 ug/dL (ref 250–450)
UIBC: 144 ug/dL

## 2024-08-22 LAB — CMP (CANCER CENTER ONLY)
ALT: 26 U/L (ref 0–44)
AST: 28 U/L (ref 15–41)
Albumin: 4.2 g/dL (ref 3.5–5.0)
Alkaline Phosphatase: 77 U/L (ref 38–126)
Anion gap: 11 (ref 5–15)
BUN: 11 mg/dL (ref 8–23)
CO2: 24 mmol/L (ref 22–32)
Calcium: 9.4 mg/dL (ref 8.9–10.3)
Chloride: 102 mmol/L (ref 98–111)
Creatinine: 0.87 mg/dL (ref 0.61–1.24)
GFR, Estimated: >60 mL/min (ref >60)
Glucose, Bld: 95 mg/dL (ref 70–99)
Potassium: 4.7 mmol/L (ref 3.5–5.1)
Sodium: 136 mmol/L (ref 135–145)
Total Bilirubin: 0.5 mg/dL (ref 0.0–1.2)
Total Protein: 7.3 g/dL (ref 6.5–8.1)

## 2024-08-22 LAB — FERRITIN: Ferritin: 296 ng/mL (ref 24–336)

## 2024-08-27 ENCOUNTER — Ambulatory Visit: Admitting: Oncology

## 2024-08-27 ENCOUNTER — Encounter: Payer: Self-pay | Admitting: Oncology

## 2024-08-27 ENCOUNTER — Ambulatory Visit

## 2024-08-27 DIAGNOSIS — D7589 Other specified diseases of blood and blood-forming organs: Secondary | ICD-10-CM

## 2024-08-27 DIAGNOSIS — F1090 Alcohol use, unspecified, uncomplicated: Secondary | ICD-10-CM

## 2024-08-27 MED ORDER — SODIUM CHLORIDE 0.9 % IV SOLN
Freq: Once | INTRAVENOUS | Status: DC
  Filled 2024-08-27: qty 250

## 2024-08-27 NOTE — Assessment & Plan Note (Addendum)
#  History of hereditary hemochromatosis.  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 We reviewed the goal of ferritin to be less than 100, iron saturation less than 45%. Lab Results  Component Value Date   IRON 150 08/22/2024   TIBC 294 08/22/2024   IRONPCTSAT 51 (H) 08/22/2024   FERRITIN 296 08/22/2024    Proceed with phlebotomy 500 ml  x 2, encourage hydration after phlebotomy

## 2024-08-27 NOTE — Progress Notes (Signed)
 Hematology/Oncology Progress note [phone] [phone]     CHIEF COMPLAINTS/REASON FOR VISIT:  Homozygous hemochromatosis management   ASSESSMENT & PLAN:   Hereditary hemochromatosis #History of hereditary hemochromatosis.  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 We reviewed the goal of ferritin to be less than 100, iron saturation less than 45%. Lab Results  Component Value Date   IRON 150 08/22/2024   TIBC 294 08/22/2024   IRONPCTSAT 51 (H) 08/22/2024   FERRITIN 296 08/22/2024    Proceed with phlebotomy 500 ml  x 2, encourage hydration after phlebotomy  Alcohol  use Last ultrasound in 2022 showed no signs of liver cirrhosis. alcohol  cessation-previously discussed, encouraged his efforts.  Macrocytosis without anemia Possibly secondary to alcohol  effect on bone marrow.  Other differential diagnosis includes bone marrow disorders, B12 deficiency.   He has no cytopenia.  Patient has adequate B12 level.  Normal light chain ratio, Myeloma panel negative M protein. Observation for now.    Orders Placed This Encounter  Procedures   CBC with Differential (Cancer Center Only)    Standing Status:   Future    Expected Date:   02/25/2025    Expiration Date:   05/26/2025   Iron and TIBC    Standing Status:   Future    Expected Date:   02/25/2025    Expiration Date:   05/26/2025   Folate    Standing Status:   Future    Expected Date:   02/25/2025    Expiration Date:   05/26/2025   Hepatic function panel    Standing Status:   Future    Expected Date:   02/25/2025    Expiration Date:   05/26/2025   CBC (Cancer Center Only)    Standing Status:   Future    Expected Date:   10/22/2024    Expiration Date:   01/20/2025   Follow-up Labs in 6 months prior to MD  All questions were answered. The patient knows to call the clinic with any problems, questions or concerns.  Zelphia Cap, MD, PhD [facility] Hematology Oncology 08/27/2024       HISTORY OF PRESENTING ILLNESS:  Andrew Walsh is a 83 y.o. male presents for homozygous hemochromatosis management.  Patient reports a history  hereditary hemochromatosis.  He was diagnosed in 89s by his previous primary care provider in [place]  and he has been an active blood donor every 8 to 10 weeks for many years.  Recently he was diagnosed with prostate cancer Gleason score 4+3 with a PSA of 9.4 and underwent IMRT radiation by Dr. Lenn and he finished in September 2020. He is mildly anemic and also cannot further donate blood due to recent radiation and was referred to me for management of hemochromatosis and iron overload.  Fatigue: denies He had blood work done at [facility] primary care provider's office recently. 08/19/2019, hemoglobin 14, hematocrit 40.7, MCV 105.7. Iron 254, ferritin 514,  07/18/2019, iron 259, ferritin 476, transferrin 193.5, TIBC 270, iron saturation 96  Patient was seen by gastroenterology Dr. Aundria on 10/07/2020, his hematochezia was considered to be secondary to radiation-induced proctitis with telangiectasia.  Patient had a colonoscopy with [facility] on 08/09/2020. Patient was recommended to use Imodium .  As patient continues to have symptoms, he has discussed with Dr. Sanjuan team in early April 2022 and was advised to use Carafate enema twice daily,  dermatology for cutaneous squamous cell carcinoma,  Status post resection  Patient has cut down on alcohol  consumption,  and is not interested in completely stop alcohol  consumption.  INTERVAL HISTORY Andrew Walsh is a 83 y.o. male who has above history reviewed by me today presents for follow up visit for management of iron overload/hemochromatosis.  Patient reports feeling okay. No new complaints.   Review of Systems  Constitutional:  Positive for fatigue. Negative for appetite change, chills, fever and unexpected weight change.  HENT:   Negative for hearing loss and voice change.    Eyes:  Negative for eye problems and icterus.  Respiratory:  Negative for chest tightness, cough and shortness of breath.   Cardiovascular:  Negative for chest pain and leg swelling.  Gastrointestinal:  Negative for abdominal distention, abdominal pain, blood in stool and diarrhea.  Endocrine: Negative for hot flashes.  Genitourinary:  Negative for difficulty urinating, dysuria and frequency.   Musculoskeletal:  Negative for arthralgias.  Skin:  Negative for itching and rash.  Neurological:  Negative for light-headedness and numbness.  Hematological:  Negative for adenopathy. Does not bruise/bleed easily.  Psychiatric/Behavioral:  Negative for confusion.     MEDICAL HISTORY:  Past Medical History:  Diagnosis Date   Actinic keratosis 01/31/2016   Left lateral neck post auricular. AK and prurigo nodularis   Actinic keratosis 01/31/2016   Left lateral neck post auricular, inferior. AK and prurigo nodularis   Actinic keratosis 12/19/2017   Left ear anti-helix   Ankle arthropathy 02/24/2014   Arthritis of foot, degenerative 10/03/2012   Overview:  Subtalar arthritis    Basal cell carcinoma 04/30/2008   Left mid dorsum nose.   Basal cell carcinoma 07/12/2009   Left post. deltoid sup. lat. edge of cancer scar.    Basal cell carcinoma 12/19/2022   Right frontal hair line. Nodular. Mohs 01/08/23   Cancer (HCC)    PROSTATE   Elevated PSA    Hereditary hemochromatosis 11/03/2015   Overview:  Treated by regular blood donation    History of kidney stones    HLD (hyperlipidemia) 11/03/2015   Hyperlipidemia    Inguinal hernia    Localized, primary osteoarthritis of ankle or foot 02/24/2014   Osteoarthritis    SCC (squamous cell carcinoma) 12/05/2022   right lateral calf, excised 01/03/23   Squamous cell carcinoma in situ 12/19/2022   Left mid back. SCCis, hypertrophic and epidermat cyst. ED[facility] 01/24/23   Squamous cell carcinoma of skin 05/17/2010   Left forearm. KA-like pattern. EDC.    Squamous cell carcinoma of skin 12/18/2016   Right mid lat. volar forearm.WD SCC. ED[facility]   Squamous cell carcinoma of skin 01/22/2019   Right lateral distal tricep near elbow.    Squamous cell carcinoma of skin 07/14/2019   Right forearm. WD SCC. ED[facility]   Squamous cell carcinoma of skin 09/29/2019   Right proximal mandible. SCCis   Squamous cell carcinoma of skin 08/17/2020   Left ear sup helix   Squamous cell carcinoma of skin 12/19/2022   Right distal pretibia. WD SCC with superficial infiltration and angiokeratoma. [facility] 01/24/23   Uses walker     SURGICAL HISTORY: Past Surgical History:  Procedure Laterality Date   ANKLE FUSION Right 2001, 2016   BONE MARROW ASPIRATION     CATARACT EXTRACTION W/PHACO Left 03/14/2023   Procedure: CATARACT EXTRACTION PHACO AND INTRAOCULAR LENS PLACEMENT (IOC) LEFT MALYUGIN CLAREON VIVITY TORIC  15.21  01:17.4;  Surgeon: Mittie Gaskin, MD;  Location: [facility];  Service: Ophthalmology;  Laterality: Left;   CATARACT EXTRACTION W/PHACO Right 03/26/2023   Procedure: CATARACT EXTRACTION PHACO  AND INTRAOCULAR LENS PLACEMENT (IOC) RIGHT MALYUGIN CLAREON VIVITY TORIC 14.34 01:11.2;  Surgeon: Mittie Gaskin, MD;  Location: [facility];  Service: Ophthalmology;  Laterality: Right;   COLONOSCOPY WITH PROPOFOL  N/A 08/09/2020   Procedure: COLONOSCOPY WITH PROPOFOL ;  Surgeon: Toledo, Ladell POUR, MD;  Location: [facility];  Service: Gastroenterology;  Laterality: N/A;   FLEXIBLE SIGMOIDOSCOPY N/A 01/05/2021   Procedure: FLEXIBLE SIGMOIDOSCOPY;  Surgeon: Toledo, Ladell POUR, MD;  Location: [facility];  Service: Gastroenterology;  Laterality: N/A;   FLEXIBLE SIGMOIDOSCOPY N/A 03/23/2021   Procedure: FLEXIBLE SIGMOIDOSCOPY;  Surgeon: Toledo, Ladell POUR, MD;  Location: [facility];  Service: Gastroenterology;  Laterality: N/A;  CRYOTHERAPY PATIENT   FOOT ARTHRODESIS, SUBTALAR     HYDROCELE EXCISION / REPAIR     HYDROCELE EXCISION / REPAIR      JOINT REPLACEMENT Bilateral 02-2004/05-2010   KNEE ARTHROSCOPY     LUMBAR LAMINECTOMY/DECOMPRESSION MICRODISCECTOMY N/A 01/13/2022   Procedure: L3-S1 POSTERIOR SPIAL DECOMPRESSION;  Surgeon: Clois Fret, MD;  Location: [facility];  Service: Neurosurgery;  Laterality: N/A;   REVISION TOTAL HIP ARTHROPLASTY  2006   Right 2011, Left 2006    SOCIAL HISTORY: Social History   Socioeconomic History   Marital status: Married    Spouse name: Not on file   Number of children: Not on file   Years of education: Not on file   Highest education level: Not on file  Occupational History   Not on file  Tobacco Use   Smoking status: Former    Current packs/day: 0.00    Types: Cigarettes    Start date: 09/10/1959    Quit date: 09/10/1979    Years since quitting: 44.9   Smokeless tobacco: Never  Vaping Use   Vaping status: Never Used  Substance and Sexual Activity   Alcohol  use: Yes    Alcohol /week: 32.0 standard drinks of alcohol     Types: 16 Glasses of wine, 16 Standard drinks or equivalent per week   Drug use: Never   Sexual activity: Not Currently  Other Topics Concern   Not on file  Social History Narrative   Live at home with wife.   Social Drivers of Health   Tobacco Use: Medium Risk (08/27/2024)   Patient History    Smoking Tobacco Use: Former    Smokeless Tobacco Use: Never    Passive Exposure: Not on file  Financial Resource Strain: Low Risk  (04/07/2024)   Received from [facility]   Overall Financial Resource Strain (CARDIA)    Difficulty of Paying Living Expenses: Not hard at all  Food Insecurity: No Food Insecurity (04/07/2024)   Received from [facility]   Epic    Within the past 12 months, you worried that your food would run out before you got the money to buy more.: Never true    Within the past 12 months, the food you bought just didn't last and you didn't have money to get more.: Never true  Transportation Needs: No  Transportation Needs (04/07/2024)   [facility] - Transportation    In the past 12 months, has lack of transportation kept you from medical appointments or from getting medications?: No    Lack of Transportation (Non-Medical): No  Physical Activity: Not on file  Stress: Not on file  Social Connections: Not on file  Intimate Partner Violence: Not At Risk (06/27/2022)   Humiliation, Afraid, Rape, and Kick questionnaire    Fear of Current or  Ex-Partner: No    Emotionally Abused: No    Physically Abused: No    Sexually Abused: No  Depression (PHQ2-9): Not on file  Alcohol  Screen: Not on file  Housing: Low Risk  (04/07/2024)   Received from [facility]   Epic    In the last 12 months, was there a time when you were not able to pay the mortgage or rent on time?: No    In the past 12 months, how many times have you moved where you were living?: 0    At any time in the past 12 months, were you homeless or living in a shelter (including now)?: No  Utilities: Not At Risk (04/07/2024)   Received from [facility]   Epic    In the past 12 months has the electric, gas, oil, or water company threatened to shut off services in your home?: No  Health Literacy: Not on file    FAMILY HISTORY: Family History  Problem Relation Age of Onset   Hemachromatosis Sister    Bladder Cancer Neg Hx    Prostate cancer Neg Hx    Kidney cancer Neg Hx     ALLERGIES:  is allergic to other.  MEDICATIONS:  Current Outpatient Medications  Medication Sig Dispense Refill   Ascorbic Acid  (VITAMIN C) 1000 MG tablet Take 1,000 mg by mouth daily.     Calcium  Carbonate-Vit D-Min (CALCIUM  600+D3 PLUS MINERALS) 600-800 MG-UNIT TABS Take 2 tablets by mouth daily.     Cholecalciferol  (VITAMIN D3) 25 MCG (1000 UT) CAPS Take by mouth daily.     Glucosamine HCl 1000 MG TABS Take 2,000 mg by mouth daily.     loperamide  (IMODIUM ) 2 MG capsule Take 4  mg by mouth daily.     vitamin B-12 (CYANOCOBALAMIN ) 500 MCG tablet Take 500 mcg by mouth daily.     mometasone  (ELOCON ) 0.1 % cream APPLY TO RASH ON ARMS TWICE DAILY FOR TWO WEEKS AS NEEDED (Patient not taking: Reported on 08/27/2024) 45 g 0   silver  sulfADIAZINE  (SILVADENE ) 1 % cream Apply topically to wound at affected area daily along with vaseline and cover with bandage daily until healed. (Patient not taking: Reported on 08/27/2024) 50 g 0   No current facility-administered medications for this visit.     PHYSICAL EXAMINATION: ECOG PERFORMANCE STATUS: 1 - Symptomatic but completely ambulatory Vitals:   08/27/24 1306 08/27/24 1315  BP: (!) 168/87 (!) 150/76  Pulse: 70   Resp: 18   Temp: (!) 97 F (36.1 C)   SpO2: 100%    Filed Weights   08/27/24 1306  Weight: 168 lb (76.2 kg)    Physical Exam Constitutional:      General: He is not in acute distress. Eyes:     General: No scleral icterus. Cardiovascular:     Rate and Rhythm: Normal rate.  Pulmonary:     Effort: Pulmonary effort is normal. No respiratory distress.     Breath sounds: Normal breath sounds.  Abdominal:     General: Bowel sounds are normal. There is no distension.     Palpations: Abdomen is soft.  Musculoskeletal:        General: Normal range of motion.     Cervical back: Normal range of motion and neck supple.  Skin:    General: Skin is warm and dry.     Findings: No rash.  Neurological:     Mental Status: He is alert and oriented to person,  place, and time. Mental status is at baseline.     Cranial Nerves: No cranial nerve deficit.  Psychiatric:        Mood and Affect: Mood normal.      LABORATORY DATA:  I have reviewed the data as listed    Latest Ref Rng & Units 08/22/2024   11:24 AM 02/22/2024   11:20 AM 08/17/2023   11:32 AM  CBC  WBC 4.0 - 10.5 K/uL 5.4  5.1  4.5   Hemoglobin 13.0 - 17.0 g/dL 86.1  86.2  85.8   Hematocrit 39.0 - 52.0 % 40.0  39.6  41.0   Platelets 150 - 400 K/uL  218  225  216       Latest Ref Rng & Units 08/22/2024   11:24 AM 02/22/2024   11:20 AM 08/17/2023   11:32 AM  CMP  Glucose 70 - 99 mg/dL 95  876    BUN 8 - 23 mg/dL 11  12    Creatinine 9.38 - 1.24 mg/dL 9.12  9.09    Sodium 864 - 145 mmol/L 136  132    Potassium 3.5 - 5.1 mmol/L 4.7  4.0    Chloride 98 - 111 mmol/L 102  101    CO2 22 - 32 mmol/L 24  23    Calcium  8.9 - 10.3 mg/dL 9.4  8.7    Total Protein 6.5 - 8.1 g/dL 7.3  7.1  7.4   Total Bilirubin 0.0 - 1.2 mg/dL 0.5  0.8  0.9   Alkaline Phos 38 - 126 U/L 77  60  53   AST 15 - 41 U/L 28  28  23    ALT 0 - 44 U/L 26  20  19       Iron/TIBC/Ferritin/ %Sat    Component Value Date/Time   IRON 150 08/22/2024 1124   TIBC 294 08/22/2024 1124   FERRITIN 296 08/22/2024 1124   IRONPCTSAT 51 (H) 08/22/2024 1124      RADIOGRAPHIC STUDIES: I have personally reviewed the radiological images as listed and agreed with the findings in the report.  No results found.

## 2024-08-27 NOTE — Patient Instructions (Signed)

## 2024-08-27 NOTE — Assessment & Plan Note (Signed)
 Possibly secondary to alcohol  effect on bone marrow.  Other differential diagnosis includes bone marrow disorders, B12 deficiency.   He has no cytopenia.  Patient has adequate B12 level.  Normal light chain ratio, Myeloma panel negative M protein. Observation for now.

## 2024-08-27 NOTE — Assessment & Plan Note (Signed)
 Last ultrasound in 2022 showed no signs of liver cirrhosis. alcohol cessation-previously discussed, encouraged his efforts.

## 2024-08-27 NOTE — Progress Notes (Signed)
 Lynwood FORBES Ran presents today for phlebotomy per MD orders. Phlebotomy procedure started at 1358 and ended at 1412. 500 mls removed. Patient tolerated procedure well. IV needle removed intact.

## 2024-09-22 ENCOUNTER — Encounter: Payer: Self-pay | Admitting: Oncology

## 2024-10-15 ENCOUNTER — Encounter: Payer: Self-pay | Admitting: Oncology

## 2024-10-22 ENCOUNTER — Inpatient Hospital Stay

## 2025-02-23 ENCOUNTER — Inpatient Hospital Stay

## 2025-02-25 ENCOUNTER — Inpatient Hospital Stay

## 2025-02-25 ENCOUNTER — Inpatient Hospital Stay: Admitting: Oncology
